# Patient Record
Sex: Female | Born: 1965 | Race: White | Hispanic: No | Smoking: Current some day smoker
Health system: Southern US, Community
[De-identification: ages and names within clinical notes are randomized; demographics above are authoritative.]

## PROBLEM LIST (undated history)

## (undated) DIAGNOSIS — F1010 Alcohol abuse, uncomplicated: Secondary | ICD-10-CM

## (undated) DIAGNOSIS — B1920 Unspecified viral hepatitis C without hepatic coma: Secondary | ICD-10-CM

## (undated) DIAGNOSIS — Z21 Asymptomatic human immunodeficiency virus [HIV] infection status: Secondary | ICD-10-CM

## (undated) DIAGNOSIS — F419 Anxiety disorder, unspecified: Secondary | ICD-10-CM

## (undated) DIAGNOSIS — B20 Human immunodeficiency virus [HIV] disease: Secondary | ICD-10-CM

## (undated) DIAGNOSIS — C801 Malignant (primary) neoplasm, unspecified: Secondary | ICD-10-CM

## (undated) DIAGNOSIS — D649 Anemia, unspecified: Secondary | ICD-10-CM

## (undated) HISTORY — DX: Anxiety disorder, unspecified: F41.9

## (undated) HISTORY — DX: Anemia, unspecified: D64.9

## (undated) HISTORY — DX: Alcohol abuse, uncomplicated: F10.10

---

## 1998-12-30 ENCOUNTER — Encounter: Admission: RE | Admit: 1998-12-30 | Discharge: 1998-12-30 | Payer: Self-pay | Admitting: Infectious Diseases

## 1998-12-30 ENCOUNTER — Ambulatory Visit (HOSPITAL_COMMUNITY): Admission: RE | Admit: 1998-12-30 | Discharge: 1998-12-30 | Payer: Self-pay | Admitting: Infectious Diseases

## 1999-02-03 ENCOUNTER — Ambulatory Visit (HOSPITAL_COMMUNITY): Admission: RE | Admit: 1999-02-03 | Discharge: 1999-02-03 | Payer: Self-pay | Admitting: Infectious Diseases

## 1999-02-03 ENCOUNTER — Encounter: Admission: RE | Admit: 1999-02-03 | Discharge: 1999-02-03 | Payer: Self-pay | Admitting: Infectious Diseases

## 2001-06-17 ENCOUNTER — Emergency Department (HOSPITAL_COMMUNITY): Admission: EM | Admit: 2001-06-17 | Discharge: 2001-06-18 | Payer: Self-pay

## 2001-06-18 ENCOUNTER — Encounter: Payer: Self-pay | Admitting: Emergency Medicine

## 2001-06-22 ENCOUNTER — Emergency Department (HOSPITAL_COMMUNITY): Admission: EM | Admit: 2001-06-22 | Discharge: 2001-06-22 | Payer: Self-pay | Admitting: Emergency Medicine

## 2001-06-22 ENCOUNTER — Encounter: Payer: Self-pay | Admitting: Emergency Medicine

## 2001-06-25 ENCOUNTER — Encounter: Payer: Self-pay | Admitting: Orthopedic Surgery

## 2001-06-25 ENCOUNTER — Inpatient Hospital Stay (HOSPITAL_COMMUNITY): Admission: RE | Admit: 2001-06-25 | Discharge: 2001-06-27 | Payer: Self-pay | Admitting: Orthopedic Surgery

## 2003-01-02 ENCOUNTER — Emergency Department (HOSPITAL_COMMUNITY): Admission: EM | Admit: 2003-01-02 | Discharge: 2003-01-02 | Payer: Self-pay | Admitting: Emergency Medicine

## 2003-01-02 ENCOUNTER — Encounter: Payer: Self-pay | Admitting: Emergency Medicine

## 2004-02-03 ENCOUNTER — Emergency Department (HOSPITAL_COMMUNITY): Admission: EM | Admit: 2004-02-03 | Discharge: 2004-02-03 | Payer: Self-pay | Admitting: Family Medicine

## 2004-04-06 ENCOUNTER — Emergency Department (HOSPITAL_COMMUNITY): Admission: EM | Admit: 2004-04-06 | Discharge: 2004-04-06 | Payer: Self-pay | Admitting: Family Medicine

## 2004-12-15 ENCOUNTER — Emergency Department (HOSPITAL_COMMUNITY): Admission: EM | Admit: 2004-12-15 | Discharge: 2004-12-15 | Payer: Self-pay | Admitting: Emergency Medicine

## 2006-02-28 ENCOUNTER — Encounter: Admission: RE | Admit: 2006-02-28 | Discharge: 2006-02-28 | Payer: Self-pay | Admitting: Internal Medicine

## 2006-02-28 ENCOUNTER — Ambulatory Visit (HOSPITAL_COMMUNITY): Admission: RE | Admit: 2006-02-28 | Discharge: 2006-02-28 | Payer: Self-pay | Admitting: Internal Medicine

## 2006-02-28 ENCOUNTER — Ambulatory Visit: Payer: Self-pay | Admitting: Internal Medicine

## 2006-02-28 ENCOUNTER — Inpatient Hospital Stay (HOSPITAL_COMMUNITY): Admission: RE | Admit: 2006-02-28 | Discharge: 2006-03-02 | Payer: Self-pay | Admitting: Psychiatry

## 2006-03-01 ENCOUNTER — Ambulatory Visit: Payer: Self-pay | Admitting: Psychiatry

## 2006-03-10 ENCOUNTER — Emergency Department (HOSPITAL_COMMUNITY): Admission: EM | Admit: 2006-03-10 | Discharge: 2006-03-10 | Payer: Self-pay | Admitting: Emergency Medicine

## 2006-03-16 ENCOUNTER — Emergency Department (HOSPITAL_COMMUNITY): Admission: EM | Admit: 2006-03-16 | Discharge: 2006-03-16 | Payer: Self-pay | Admitting: Family Medicine

## 2006-04-14 ENCOUNTER — Emergency Department (HOSPITAL_COMMUNITY): Admission: EM | Admit: 2006-04-14 | Discharge: 2006-04-15 | Payer: Self-pay | Admitting: Emergency Medicine

## 2006-06-26 ENCOUNTER — Ambulatory Visit: Payer: Self-pay | Admitting: Hospitalist

## 2006-06-26 ENCOUNTER — Ambulatory Visit (HOSPITAL_COMMUNITY): Admission: RE | Admit: 2006-06-26 | Discharge: 2006-06-26 | Payer: Self-pay | Admitting: Hospitalist

## 2006-08-05 ENCOUNTER — Emergency Department (HOSPITAL_COMMUNITY): Admission: EM | Admit: 2006-08-05 | Discharge: 2006-08-05 | Payer: Self-pay | Admitting: Emergency Medicine

## 2006-11-06 ENCOUNTER — Emergency Department (HOSPITAL_COMMUNITY): Admission: EM | Admit: 2006-11-06 | Discharge: 2006-11-06 | Payer: Self-pay | Admitting: Emergency Medicine

## 2007-04-25 ENCOUNTER — Emergency Department (HOSPITAL_COMMUNITY): Admission: EM | Admit: 2007-04-25 | Discharge: 2007-04-25 | Payer: Self-pay | Admitting: Family Medicine

## 2007-04-30 ENCOUNTER — Emergency Department (HOSPITAL_COMMUNITY): Admission: EM | Admit: 2007-04-30 | Discharge: 2007-04-30 | Payer: Self-pay | Admitting: Emergency Medicine

## 2007-05-13 ENCOUNTER — Emergency Department (HOSPITAL_COMMUNITY): Admission: EM | Admit: 2007-05-13 | Discharge: 2007-05-14 | Payer: Self-pay | Admitting: Emergency Medicine

## 2007-06-17 ENCOUNTER — Encounter: Admission: RE | Admit: 2007-06-17 | Discharge: 2007-06-17 | Payer: Self-pay | Admitting: Internal Medicine

## 2007-08-28 ENCOUNTER — Emergency Department (HOSPITAL_COMMUNITY): Admission: EM | Admit: 2007-08-28 | Discharge: 2007-08-28 | Payer: Self-pay | Admitting: Emergency Medicine

## 2007-09-02 ENCOUNTER — Emergency Department (HOSPITAL_COMMUNITY): Admission: EM | Admit: 2007-09-02 | Discharge: 2007-09-02 | Payer: Self-pay | Admitting: Family Medicine

## 2007-10-03 ENCOUNTER — Emergency Department (HOSPITAL_COMMUNITY): Admission: EM | Admit: 2007-10-03 | Discharge: 2007-10-03 | Payer: Self-pay | Admitting: Emergency Medicine

## 2007-12-16 ENCOUNTER — Emergency Department (HOSPITAL_COMMUNITY): Admission: EM | Admit: 2007-12-16 | Discharge: 2007-12-16 | Payer: Self-pay | Admitting: Emergency Medicine

## 2008-01-21 ENCOUNTER — Emergency Department (HOSPITAL_COMMUNITY): Admission: EM | Admit: 2008-01-21 | Discharge: 2008-01-21 | Payer: Self-pay | Admitting: Emergency Medicine

## 2008-01-24 ENCOUNTER — Emergency Department (HOSPITAL_COMMUNITY): Admission: EM | Admit: 2008-01-24 | Discharge: 2008-01-24 | Payer: Self-pay | Admitting: Emergency Medicine

## 2008-01-31 ENCOUNTER — Emergency Department (HOSPITAL_COMMUNITY): Admission: EM | Admit: 2008-01-31 | Discharge: 2008-01-31 | Payer: Self-pay | Admitting: Emergency Medicine

## 2008-05-16 ENCOUNTER — Emergency Department (HOSPITAL_COMMUNITY): Admission: EM | Admit: 2008-05-16 | Discharge: 2008-05-16 | Payer: Self-pay | Admitting: Emergency Medicine

## 2008-05-25 ENCOUNTER — Inpatient Hospital Stay (HOSPITAL_COMMUNITY): Admission: EM | Admit: 2008-05-25 | Discharge: 2008-05-29 | Payer: Self-pay | Admitting: Emergency Medicine

## 2008-05-30 ENCOUNTER — Emergency Department (HOSPITAL_COMMUNITY): Admission: EM | Admit: 2008-05-30 | Discharge: 2008-05-30 | Payer: Self-pay | Admitting: Emergency Medicine

## 2008-07-18 ENCOUNTER — Emergency Department (HOSPITAL_COMMUNITY): Admission: EM | Admit: 2008-07-18 | Discharge: 2008-07-18 | Payer: Self-pay | Admitting: Emergency Medicine

## 2008-07-19 ENCOUNTER — Emergency Department (HOSPITAL_COMMUNITY): Admission: EM | Admit: 2008-07-19 | Discharge: 2008-07-19 | Payer: Self-pay | Admitting: Emergency Medicine

## 2008-07-24 ENCOUNTER — Emergency Department (HOSPITAL_COMMUNITY): Admission: EM | Admit: 2008-07-24 | Discharge: 2008-07-24 | Payer: Self-pay | Admitting: Emergency Medicine

## 2008-08-27 ENCOUNTER — Ambulatory Visit: Payer: Self-pay | Admitting: Family Medicine

## 2008-09-22 ENCOUNTER — Encounter: Payer: Self-pay | Admitting: Gastroenterology

## 2008-10-19 ENCOUNTER — Ambulatory Visit: Payer: Self-pay | Admitting: Gastroenterology

## 2008-11-16 ENCOUNTER — Ambulatory Visit: Payer: Self-pay | Admitting: Gastroenterology

## 2009-01-13 ENCOUNTER — Emergency Department (HOSPITAL_COMMUNITY): Admission: EM | Admit: 2009-01-13 | Discharge: 2009-01-13 | Payer: Self-pay | Admitting: Family Medicine

## 2009-05-05 ENCOUNTER — Emergency Department (HOSPITAL_COMMUNITY): Admission: EM | Admit: 2009-05-05 | Discharge: 2009-05-05 | Payer: Self-pay | Admitting: Emergency Medicine

## 2009-06-15 ENCOUNTER — Encounter (INDEPENDENT_AMBULATORY_CARE_PROVIDER_SITE_OTHER): Payer: Self-pay | Admitting: Internal Medicine

## 2009-12-20 ENCOUNTER — Emergency Department (HOSPITAL_COMMUNITY): Admission: EM | Admit: 2009-12-20 | Discharge: 2009-12-20 | Payer: Self-pay | Admitting: Emergency Medicine

## 2010-01-08 ENCOUNTER — Emergency Department (HOSPITAL_COMMUNITY): Admission: EM | Admit: 2010-01-08 | Discharge: 2010-01-09 | Payer: Self-pay | Admitting: Emergency Medicine

## 2010-04-15 ENCOUNTER — Emergency Department (HOSPITAL_COMMUNITY): Admission: EM | Admit: 2010-04-15 | Discharge: 2010-04-16 | Payer: Self-pay | Admitting: Emergency Medicine

## 2010-05-05 ENCOUNTER — Emergency Department (HOSPITAL_COMMUNITY): Admission: EM | Admit: 2010-05-05 | Discharge: 2010-05-05 | Payer: Self-pay | Admitting: Emergency Medicine

## 2010-05-06 ENCOUNTER — Emergency Department (HOSPITAL_COMMUNITY): Admission: EM | Admit: 2010-05-06 | Discharge: 2010-05-06 | Payer: Self-pay | Admitting: Emergency Medicine

## 2010-06-02 ENCOUNTER — Ambulatory Visit: Payer: Self-pay | Admitting: Family Medicine

## 2010-06-02 LAB — CONVERTED CEMR LAB
Amphetamine Screen, Ur: NEGATIVE
Barbiturate Quant, Ur: NEGATIVE
Cocaine Metabolites: NEGATIVE
Marijuana Metabolite: NEGATIVE
Opiate Screen, Urine: NEGATIVE

## 2010-06-14 ENCOUNTER — Ambulatory Visit: Payer: Self-pay | Admitting: Family Medicine

## 2010-06-16 ENCOUNTER — Emergency Department (HOSPITAL_COMMUNITY): Admission: EM | Admit: 2010-06-16 | Discharge: 2010-06-16 | Payer: Self-pay | Admitting: Family Medicine

## 2010-06-30 ENCOUNTER — Ambulatory Visit: Payer: Self-pay | Admitting: Family Medicine

## 2010-11-08 ENCOUNTER — Emergency Department (HOSPITAL_COMMUNITY): Admission: EM | Admit: 2010-11-08 | Discharge: 2010-11-10 | Payer: Self-pay | Admitting: Emergency Medicine

## 2010-11-09 DIAGNOSIS — F329 Major depressive disorder, single episode, unspecified: Secondary | ICD-10-CM

## 2010-11-10 ENCOUNTER — Ambulatory Visit: Payer: Self-pay | Admitting: Psychiatry

## 2010-11-10 ENCOUNTER — Inpatient Hospital Stay (HOSPITAL_COMMUNITY)
Admission: EM | Admit: 2010-11-10 | Discharge: 2010-11-14 | Payer: Self-pay | Source: Home / Self Care | Admitting: Psychiatry

## 2010-11-10 DIAGNOSIS — F329 Major depressive disorder, single episode, unspecified: Secondary | ICD-10-CM

## 2011-01-08 ENCOUNTER — Encounter: Payer: Self-pay | Admitting: Internal Medicine

## 2011-01-13 ENCOUNTER — Emergency Department (HOSPITAL_COMMUNITY)
Admission: EM | Admit: 2011-01-13 | Discharge: 2011-01-13 | Disposition: A | Discharge status: Other Acute Inpatient Hospital | Payer: Self-pay | Source: Home / Self Care | Admitting: Emergency Medicine

## 2011-01-13 ENCOUNTER — Inpatient Hospital Stay (HOSPITAL_COMMUNITY)
Admission: EM | Admit: 2011-01-13 | Discharge: 2011-01-25 | DRG: 897 | Disposition: A | Discharge status: Home / Self Care | Payer: Self-pay | Attending: Psychiatry | Admitting: Psychiatry

## 2011-01-13 DIAGNOSIS — M1990 Unspecified osteoarthritis, unspecified site: Secondary | ICD-10-CM

## 2011-01-13 DIAGNOSIS — F10939 Alcohol use, unspecified with withdrawal, unspecified: Secondary | ICD-10-CM

## 2011-01-13 DIAGNOSIS — B1920 Unspecified viral hepatitis C without hepatic coma: Secondary | ICD-10-CM

## 2011-01-13 DIAGNOSIS — F10239 Alcohol dependence with withdrawal, unspecified: Secondary | ICD-10-CM

## 2011-01-13 DIAGNOSIS — M069 Rheumatoid arthritis, unspecified: Secondary | ICD-10-CM

## 2011-01-13 DIAGNOSIS — F1920 Other psychoactive substance dependence, uncomplicated: Principal | ICD-10-CM

## 2011-01-13 DIAGNOSIS — F1020 Alcohol dependence, uncomplicated: Secondary | ICD-10-CM

## 2011-01-13 DIAGNOSIS — G8929 Other chronic pain: Secondary | ICD-10-CM

## 2011-01-13 DIAGNOSIS — F1994 Other psychoactive substance use, unspecified with psychoactive substance-induced mood disorder: Secondary | ICD-10-CM

## 2011-01-13 DIAGNOSIS — X838XXA Intentional self-harm by other specified means, initial encounter: Secondary | ICD-10-CM

## 2011-01-13 DIAGNOSIS — S61509A Unspecified open wound of unspecified wrist, initial encounter: Secondary | ICD-10-CM

## 2011-01-13 DIAGNOSIS — Z21 Asymptomatic human immunodeficiency virus [HIV] infection status: Secondary | ICD-10-CM

## 2011-01-13 LAB — COMPREHENSIVE METABOLIC PANEL
Albumin: 3.9 g/dL (ref 3.5–5.2)
BUN: 6 mg/dL (ref 6–23)
CO2: 29 mEq/L (ref 19–32)
Chloride: 92 mEq/L — ABNORMAL LOW (ref 96–112)
Creatinine, Ser: 0.71 mg/dL (ref 0.4–1.2)
GFR calc non Af Amer: >60 mL/min (ref >60)
Glucose, Bld: 144 mg/dL — ABNORMAL HIGH (ref 70–99)
Total Bilirubin: 0.6 mg/dL (ref 0.3–1.2)

## 2011-01-13 LAB — RAPID URINE DRUG SCREEN, HOSP PERFORMED
Barbiturates: NOT DETECTED
Opiates: POSITIVE — AB

## 2011-01-13 LAB — DIFFERENTIAL
Basophils Relative: 0 % (ref 0–1)
Eosinophils Absolute: 0.2 10*3/uL (ref 0.0–0.7)
Monocytes Absolute: 1.2 10*3/uL — ABNORMAL HIGH (ref 0.1–1.0)
Monocytes Relative: 9 % (ref 3–12)
Neutro Abs: 9 10*3/uL — ABNORMAL HIGH (ref 1.7–7.7)

## 2011-01-13 LAB — ETHANOL: Alcohol, Ethyl (B): <5 mg/dL (ref 0–10)

## 2011-01-13 LAB — CBC
Hemoglobin: 12.8 g/dL (ref 12.0–15.0)
MCH: 28.8 pg (ref 26.0–34.0)
MCHC: 34.8 g/dL (ref 30.0–36.0)

## 2011-01-13 LAB — ACETAMINOPHEN LEVEL: Acetaminophen (Tylenol), Serum: <10 ug/mL — ABNORMAL LOW (ref 10–30)

## 2011-01-16 LAB — ELECTROLYTE PANEL
CO2: 27 mEq/L (ref 19–32)
Chloride: 106 mEq/L (ref 96–112)
Potassium: 3.9 mEq/L (ref 3.5–5.1)

## 2011-01-17 LAB — VITAMIN B12: Vitamin B-12: 536 pg/mL (ref 211–911)

## 2011-01-17 LAB — FOLATE: Folate: 13.1 ng/mL

## 2011-01-18 DIAGNOSIS — F19939 Other psychoactive substance use, unspecified with withdrawal, unspecified: Secondary | ICD-10-CM

## 2011-01-18 DIAGNOSIS — F1920 Other psychoactive substance dependence, uncomplicated: Secondary | ICD-10-CM

## 2011-01-29 NOTE — Discharge Summary (Signed)
[name]              ACCOUNT NO.:  [id number]  MEDICAL RECORD NO.:  [id number]           PATIENT TYPE:  I  LOCATION:  0500                          FACILITY:  [facility]  PHYSICIAN:  Marlis Edelson, DO        DATE OF BIRTH:  [birth year]  DATE OF ADMISSION:  01/13/2011 DATE OF DISCHARGE:                              DISCHARGE SUMMARY   CHIEF COMPLAINT:  Drug abuse.  HISTORY:  [name] is a 45 year old divorced Caucasian female who presented to the emergency department via EMS when her son called stating that she had attempted to kill herself by cutting her wrist. She showed up to the [facility] Emergency Department where she had a laceration to her wrist apparently.  She had attempted to kill himself by cutting her wrist.  She had diagonal bilateral lacerations. Those were repaired in the emergency department.  The patient told them in the emergency department that she had dementia.  She stated she was having problems remembering things at times and taking care of herself. She also acknowledged hallucinations for the past 3-4 weeks, and she reported that she saw a dark ghost, flashes of white light, hears unintelligible voices and sounds.  She was tearful and drowsy during the assessment.  Urine drug screen was positive for opiates.  Alcohol was less than 5. Electrolytes were altered with potassium of 3.1 and a sodium level of 131, chloride was 92, glucose was elevated at 144.  CBC was elevated at 12.6.  She has previously been admitted to the [facility] due to polysubstance drug dependence.  She has also been treated at the [facility] in 1984.  She was on no medications at the time of admission.  HOSPITAL COURSE:  [name] was admitted to the Adult Unit where she was integrated into the adult milieu.  She attended substance abuse groups and Alcoholics Anonymous.  She has a known history of human immunodeficiency virus, vitamin C,  chronic pain and rheumatoid arthritis.  She was on no medications at the time of her admission and did not want interventions for her HIV.  She was placed on a Librium detox protocol due to her polysubstance abuse.  She tolerated that detox protocol without difficulty, having no severe withdrawal, no seizures and no DTs.  She was also placed on Risperdal 0.5 mg three times a day, Benadryl 50 mg nightly p.r.n.  during her hospitalization, the Librium was finally discontinued.  She was started on Neurontin due to her severe pain.  Risperdal was discontinued.  She was titrated to 300 mg of Neurontin three times a day for neuropathy, Naprosyn 500 mg twice per day for pain, Protonix 20 mg daily for GI prophylaxis and gastritis. She tolerated all of those medications well.  Naprosyn was then discontinued and the patient was placed on Mobic 7.5 mg daily for arthritis, which she takes at home.  That was eventually titrated to a dose of 15 mg.  She had an increase in her Neurontin to 400 mg later in her hospitalization.  She also received albuterol inhaler 1-2 puffs every 4 hours as  needed for shortness of breath.  Her staples from her wrist lacerations were discontinued.  Those wounds healed without difficulty.  She was placed on trazodone 50 mg nightly for sleep and did receive Ensure for dietary supplementation.  Suicidal ideation stopped.  She stated my thought pattern had changed and that the milieu had helped.  She was willing to look at longer term treatment, and she was involuntarily committed with a plan of sending her to [facility].  The patient related by January 30 that her mood was good. A combination of events had led to her feeling depressed.  That was work, school, children and the death of a man with whom she was in a relationship.  She displayed no suicidal activities during the course of her hospitalization.  She had no homicidality, no hypomania, mania or psychosis.  The Neurontin  began to help.  Her physical condition tended to improve.  Folic acid was normal at 13.1 ng/mL, vitamin B12 was normal at 536 pg/mL.  The TSH was low at 0.112.  Repeat chemistry showed sodium normal at 140, potassium normal at 3.9, chloride normal at 106, and CO2 normal at 27.  Her blood pressure was within normal limits.  We did recheck TSH and T3 and T4.  Records from [facility] regarding HIV status and treatment were requested.  She reported by February 1 that she was physically better.  Vital signs remained stable.  She had no drug or alcohol cravings.  She was sleeping better.  She was more steady on her feet.  She completed the Librium and clonidine detox protocol without difficulty.  Her T4 was 1.25, T3 was 2.4.  She continued to remain active in groups and was accepted into the [facility] program for transfer on January 25, 2011.  LABORATORY/IMAGING:  As above.  CONSULTATIONS:  None.  COMPLICATIONS:  None.  PROCEDURES:  None.  MENTAL STATUS EXAMINATION:  She was casually dressed, eye contact was appropriate.  Behavior was within normal limits.  Speech clear, coherent, regular rate, rhythm, volume and tone.  Her mood was improved, affect mildly constricted.  Thought process linear, logical and goal- directed.  Thought content without perceptual symptoms, ideas of reference or delusions.  Her judgment was fair.  Insight had improved some.  She was cognitively intact.  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 AXIS II:  Deferred. AXIS III:  Human immunodeficiency virus, hepatitis C, chronic pain, osteoarthritis, rheumatoid arthritis. AXIS IV:  Chronic substance abuse. AXIS V:  45.  DISCHARGE INSTRUCTIONS:  The patient will be transported to the [facility] facility for further inpatient treatment.  DISCHARGE MEDICATIONS: 1. Protonix 20 mg daily. 2. Neurontin 400 mg three times a day. 3.  Mobic 15 mg daily. 4. Trazodone 50 mg nightly.  Further recommendations per [facility] personnel.  PROGNOSIS:  Dependent upon completion of the substance abuse treatment program.          ______________________________ Marlis Edelson, DO    DB/MEDQ  D:  01/24/2011  T:  01/24/2011  Job:  [id number]  Electronically Signed by Marlis Edelson MD on 01/29/2011 11:33:55 AM

## 2011-01-29 NOTE — H&P (Signed)
NAMESALA, TAGUE              ACCOUNT NO.:  [id number]  MEDICAL RECORD NO.:  [id number]           PATIENT TYPE:  LOCATION:                                 FACILITY:  PHYSICIAN:  Marlis Edelson, DO        DATE OF BIRTH:  Apr 06, 1966  DATE OF ADMISSION: DATE OF DISCHARGE:                      PSYCHIATRIC ADMISSION ASSESSMENT   This is a voluntary admission to the services of Dr. Marlis Edelson.  HISTORY OF PRESENT ILLNESS:  This is a 45 year old divorced white female.  She presented to the emergency department via EMS which was called by her son.  Apparently she had attempted to kill herself by cutting her wrists.  She had diagonal bilateral lacerations.  The left wrist 3 deep lacerations, and 4 to 5 cm of muscle fascia was visible and one laceration to the right wrist 3 cm long.  The patient told them that she had a dementia.  However, it is not a professional diagnosis, that she had been experiencing problems with remembering dates, times, taking care of herself.  She also acknowledged hallucinations for the past 3 to 4 weeks.  She reported that she sees a dark ghost, flashes of white light, hears unintelligible voices and sounds.  She was tearful and drowsy during the assessment.  Her urine drug screen was positive only for opiates.  Her alcohol level was less than 5.  Her electrolytes were off.  Her sodium was low at 131, potassium low 3.1.  Chloride was 92.  Glucose was elevated at 144.  WBC was elevated at 12.6, and she did not have any acetaminophen on board.  PAST PSYCHIATRIC HISTORY:  She was here in 2011.  She was also in [facility] in 1984.  SOCIAL HISTORY:  She denies alcohol and drug history.  She reported this morning that she does a lot of drugs.  She was not very forthcoming yesterday, and we had to start her on the clonidine protocol.  Her primary care provider is [facility].  She has seen a Dr. Omelia Blackwater at [facility] for psychiatry.  MEDICAL  PROBLEMS:  She is known to be HIV positive, have hepatitis C, chronic pain, rheumatoid arthritis.  MEDICATIONS:  She states she does not take any.  We were unable to verify.  We will have to work on that.  DRUG ALLERGIES:  She says she is allergic to ACETAMINOPHEN, ASPIRIN, CIPRO, and CODEINE.  POSITIVE PHYSICAL FINDINGS:  As already stated she was medically cleared in the ED at [facility].  Vital signs showed she was afebrile, 98 to 98.5.  Her pulse was 76 to 103.  Respirations were 16 to 21.  Blood pressure was 99/62 to 114/71.  She did have lacerations to her left and right wrists.  However, apparently they did not require sutures.  MENTAL STATUS EXAMINATION:  She was seen in conjunction with Dr. Geralyn Flash this morning.  She was disheveled.  She appeared ill and uncomfortable.  She was in the bed.  She was dysphoric, although she was alert.  She had no signs and symptoms of psychosis or delirium.  She wanted help, and it was  felt that she was in withdrawal.  DIAGNOSES:  AXIS I:  Polysubstance abuse, mood disorder secondary to various chronic illnesses, human immunodeficiency virus, hepatitis C. AXIS II:  Deferred. AXIS III:  Human immunodeficiency virus positive, hepatitis C, chronic pain, rheumatoid arthritis. AXIS IV:  Moderate. AXIS V:  31.  PLAN:  Is to admit for safety and stabilization.  We are going to have to increase her data base, and she was empirically started on the clonidine protocol.  Her need for antidepressants will be reassessed as she clears, and estimated length of stay is 3 to 5 days.     Mickie Leonarda Salon, P.A.-C.   ______________________________ Marlis Edelson, DO    MD/MEDQ  D:  01/14/2011  T:  01/14/2011  Job:  [id number]  Electronically Signed by Jaci Lazier ADAMS P.A.-C. on 01/28/2011 12:36:00 PM Electronically Signed by Marlis Edelson MD on 01/29/2011 11:33:50 AM

## 2011-02-28 LAB — RAPID URINE DRUG SCREEN, HOSP PERFORMED
Benzodiazepines: POSITIVE — AB
Cocaine: NOT DETECTED
Opiates: POSITIVE — AB
Tetrahydrocannabinol: NOT DETECTED

## 2011-02-28 LAB — DIFFERENTIAL
Basophils Relative: 1 % (ref 0–1)
Eosinophils Absolute: 0.2 10*3/uL (ref 0.0–0.7)
Eosinophils Relative: 3 % (ref 0–5)
Monocytes Absolute: 0.5 10*3/uL (ref 0.1–1.0)
Monocytes Relative: 12 % (ref 3–12)

## 2011-02-28 LAB — CBC
HCT: 34.2 % — ABNORMAL LOW (ref 36.0–46.0)
Platelets: 181 10*3/uL (ref 150–400)
RBC: 3.84 MIL/uL — ABNORMAL LOW (ref 3.87–5.11)
RDW: 15.1 % (ref 11.5–15.5)
WBC: 4.6 10*3/uL (ref 4.0–10.5)

## 2011-02-28 LAB — URINALYSIS, ROUTINE W REFLEX MICROSCOPIC
Bilirubin Urine: NEGATIVE
Glucose, UA: NEGATIVE mg/dL
Ketones, ur: NEGATIVE mg/dL
Specific Gravity, Urine: 1.009 (ref 1.005–1.030)
pH: 7 (ref 5.0–8.0)

## 2011-02-28 LAB — COMPREHENSIVE METABOLIC PANEL
ALT: 25 U/L (ref 0–35)
Albumin: 3.1 g/dL — ABNORMAL LOW (ref 3.5–5.2)
Alkaline Phosphatase: 60 U/L (ref 39–117)
Chloride: 115 mEq/L — ABNORMAL HIGH (ref 96–112)
Potassium: 3.3 mEq/L — ABNORMAL LOW (ref 3.5–5.1)
Sodium: 143 mEq/L (ref 135–145)
Total Bilirubin: 0.4 mg/dL (ref 0.3–1.2)
Total Protein: 6 g/dL (ref 6.0–8.3)

## 2011-02-28 LAB — POCT PREGNANCY, URINE: Preg Test, Ur: NEGATIVE

## 2011-02-28 LAB — POCT CARDIAC MARKERS: Troponin i, poc: <0.05 ng/mL (ref 0.00–0.09)

## 2011-02-28 LAB — ETHANOL: Alcohol, Ethyl (B): 194 mg/dL — ABNORMAL HIGH (ref 0–10)

## 2011-02-28 LAB — POCT I-STAT, CHEM 8
BUN: 3 mg/dL — ABNORMAL LOW (ref 6–23)
Calcium, Ion: 1.11 mmol/L — ABNORMAL LOW (ref 1.12–1.32)
Chloride: 111 mEq/L (ref 96–112)
HCT: 36 % (ref 36.0–46.0)
Potassium: 3.4 mEq/L — ABNORMAL LOW (ref 3.5–5.1)

## 2011-02-28 LAB — ACETAMINOPHEN LEVEL: Acetaminophen (Tylenol), Serum: <10 ug/mL — ABNORMAL LOW (ref 10–30)

## 2011-03-07 LAB — URINALYSIS, ROUTINE W REFLEX MICROSCOPIC
Glucose, UA: NEGATIVE mg/dL
Hgb urine dipstick: NEGATIVE
Specific Gravity, Urine: 1.004 — ABNORMAL LOW (ref 1.005–1.030)
Urobilinogen, UA: 0.2 mg/dL (ref 0.0–1.0)
pH: 6 (ref 5.0–8.0)

## 2011-03-07 LAB — URINE CULTURE

## 2011-03-07 LAB — URINE MICROSCOPIC-ADD ON

## 2011-03-28 LAB — COMPREHENSIVE METABOLIC PANEL
Alkaline Phosphatase: 63 U/L (ref 39–117)
BUN: 11 mg/dL (ref 6–23)
Chloride: 106 mEq/L (ref 96–112)
Glucose, Bld: 103 mg/dL — ABNORMAL HIGH (ref 70–99)
Potassium: 3.7 mEq/L (ref 3.5–5.1)
Total Bilirubin: 0.3 mg/dL (ref 0.3–1.2)
Total Protein: 6.1 g/dL (ref 6.0–8.3)

## 2011-03-28 LAB — URINE MICROSCOPIC-ADD ON

## 2011-03-28 LAB — LIPASE, BLOOD: Lipase: 25 U/L (ref 11–59)

## 2011-03-28 LAB — DIFFERENTIAL
Basophils Absolute: 0 10*3/uL (ref 0.0–0.1)
Basophils Relative: 1 % (ref 0–1)
Neutro Abs: 1.9 10*3/uL (ref 1.7–7.7)
Neutrophils Relative %: 35 % — ABNORMAL LOW (ref 43–77)

## 2011-03-28 LAB — CBC
HCT: 34.3 % — ABNORMAL LOW (ref 36.0–46.0)
Hemoglobin: 11.5 g/dL — ABNORMAL LOW (ref 12.0–15.0)
RDW: 15.5 % (ref 11.5–15.5)

## 2011-03-28 LAB — URINALYSIS, ROUTINE W REFLEX MICROSCOPIC
Ketones, ur: NEGATIVE mg/dL
Nitrite: NEGATIVE
Protein, ur: NEGATIVE mg/dL

## 2011-03-28 LAB — LACTIC ACID, PLASMA: Lactic Acid, Venous: 1 mmol/L (ref 0.5–2.2)

## 2011-05-02 NOTE — Consult Note (Signed)
NAMELYNNET, HEFLEY              ACCOUNT NO.:  [id number]   MEDICAL RECORD NO.:  [id number]          PATIENT TYPE:  INP   LOCATION:  1332                         FACILITY:  [facility]   PHYSICIAN:  Antonietta Breach, M.D.  DATE OF BIRTH:  1966-09-19   DATE OF CONSULTATION:  05/26/2008  DATE OF DISCHARGE:                                 CONSULTATION   REASON FOR CONSULTATION:  Psychosis agitation.   REQUESTING PHYSICIAN:  [facility].   HISTORY OF PRESENT ILLNESS:  Ms. Rembert is a 45 year old female  admitted to the [facility] on May 25, 2008 with abdominal  pain, constipation, abdominal distention.   Over the past 24 hours, the patient has erupted with severe agitation,  confusion, impaired memory.  She has been pacing and told her attending  physician that she was the devil.  She told the undersigned when the  undersigned arrived to her room that the undersigned was God.   These symptoms have continued despite general medical supportive care.   The patient was on lithium 300 mg daily.  The other psychotropics prior  to admission, if any, are not currently known.   PAST PSYCHIATRIC HISTORY:  The patient has a prior medical record from  [facility], and she is not an adequate historian at this time.   In review of the past medical record, the patient was admitted to the  [facility] inpatient psychiatric adult unit in March of 2007 involuntarily.  Her alcohol level at that time was 170.  She presented with a number of  weeks of depressed mood, poor energy, difficulty concentrating,  anhedonia, consistent with major depressive symptoms.   The patient was treated with Wellbutrin at bedtime and was diagnosed  with major depressive disorder and alcohol abuse.   The patient was also known to have been followed by the [facility] at  that time.  She denied any suicide attempts at that time.   The patient has mentioned bipolar disorder as a diagnosis during this  history;  however, with the patient's current delirium state, this cannot  be confirmed.  She was listed as having a psychotropic agent, lithium  300 mg daily on admission.  The exact dosage and other psychotropic  agents, if any, are not known.   FAMILY PSYCHIATRIC HISTORY:  None known.   SOCIAL HISTORY:  The patient cannot provide a history at this time;  however, in review of the past medical record, he has a history of being  a single mother with a 22 year old.  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  Her husband died of HIV.  Legally, the patient has a history of disorderly conduct charge.   PAST MEDICAL HISTORY:  HIV positive with the last CD-4 count by report  880.   ALLERGIES:  PENICILLIN, CIPROFLOXACIN.   Please see the above for other current general medical status.   MEDICATIONS:  THE MAR is reviewed.  The patient is on lithium 300 mg and  daily, Valium 2 mg IV q.6h. p.r.n.   LABORATORY DATA AND TESTS:  Head  CT without contrast on May 16, 2008 was  unremarkable.  WBC 10.3, hemoglobin 10.5, platelet count 290.  Sodium  140, BUN 30, creatinine 0.58.  HCG negative.  SGOT 23, SGPT 21.  QTC on  EKG 465 milliseconds.   REVIEW OF SYSTEMS:  Constitutional, head, eyes, ears, nose, throat,  mouth, neurologic, psychiatric, cardiovascular, respiratory,  gastrointestinal, genitourinary, skin, musculoskeletal, hematologic,  lymphatic, endocrine, metabolic all unremarkable.   PHYSICAL EXAMINATION:  VITAL SIGNS:  Temperature 97.6, pulse 78,  respiratory rate 20, blood pressure 136/98, O2 saturation on room air  98%.  Weight 69.2 kg.  GENERAL APPEARANCE:  Ms. Godino is a middle-aged female who is  agitated, in bed, shifting position often.  Her attention span is  obviously decreased.  She has no abnormal involuntary movements.   MENTAL STATUS EXAM:  Ms. Coriz does have clouding of consciousness.  She has decreased attention span with easy distractibility.   Her affect  is agitated.  Her mood is very anxious.  She is able to provide the date  and the day of the month as well as the year.  She does know where she  is.  She is oriented to person.  Her memory is impaired.  She cannot  recall the events of the morning.  Her speech is pressured.  Thought  process involves some disorganization and some flight of ideas.  Thought  content, delusions.  No thoughts of harming herself or others are  evident.  She does not appear to be having any hallucinations at this  time.  Insight is poor.  Judgment is impaired.  Fund of knowledge and  intelligence are below that of her estimated predelirium baseline.   ASSESSMENT:   AXIS I:  1. 293.00, delirium, not otherwise specified.  This does appear to be      multifactorial and could be due to lithium toxicity combined with      her chronic infectious status as well as other acute general      medical factors including a mild anemia and acute factors as      mentioned above.  2. Rule out bipolar disorder versus major depressive disorder.  3. Rule out alcohol dependence.  No current physiologic dependence is      known by vital signs; however, this still is a possibility.  Also,      alcohol withdrawal could be contributing to the delirium.   AXIS II:  Deferred.   AXIS III:  See past medical history and labs above.   AXIS IV:  General medical primary support group.   AXIS V:  20.   RECOMMENDATIONS:  1. Would check a lithium level and a TSH.  2. Would start Zyprexa 5 mg p.o. or IM daily at 1600, with the first      dose as soon as possible today.  Would recheck her QTC, and if not      over 500 milliseconds would then increase the Zyprexa to 10 mg      daily for anti-agitation anti-psychosis.  3. Would discontinue the Valium and start Ativan 1-3 mg p.o.,IM, or IV      q.4h. p.r.n.  4. The psychiatric plan for her more chronic condition will have to be      adjusted as the patient's acute symptoms  improve.  5. Would provide memory and orientation cues in the room and continue      with a low stimulation environment.      Antonietta Breach, M.D.  Electronically Signed     JW/MEDQ  D:  05/26/2008  T:  05/26/2008  Job:  [id number]

## 2011-05-02 NOTE — H&P (Signed)
[name], [name]              ACCOUNT NO.:  [id number]   MEDICAL RECORD NO.:  [id number]          PATIENT TYPE:  INP   LOCATION:  1332                         FACILITY[facility]  [facility]   PHYSICIAN:  Herbie Saxon, MDDATE OF BIRTH:  1966/03/02   DATE OF ADMISSION:  05/25/2008  DATE OF DISCHARGE:                              HISTORY & PHYSICAL   PRIMARY CARE PHYSICIAN:  Dr. Coralee Pesa   Patient is a Full Code.   CHIEF COMPLAINT:  Abdominal pain and distention for two days associated  with fever, along with some constipation.   HISTORY OF PRESENTING COMPLAINT:  This is a 45 year old Caucasian female  with past history of HIV  positive, hepatitis __________, COPD,  gastroesophageal reflux, rheumatic arthritis, tobacco abuse and ex-  alcohol drinker, who is on narcotics analgesics.  She was  quite well  until two days ago when she was started noticing generalized abdominal  pain, 8 to 10 in intensity, throbbing, nonradiating.  Partly relieved by  sitting up, associated with abdominal distention and constipation.  She  did have an episode of vomiting, clear,  some ingested food yesterday,  associated with short high grade fever this morning which was relieved  after taking ibuprofen.  She denies any melena or hematemesis.  No  jaundice.  She has a dry cough, no shortness of breath or wheezing.  No  new skin rash or joint swelling; no node swelling.  CD4 count is 880.  The CT scan of the abdomen on presentation showed fecal impaction versus  clinical ileus.   PAST SURGICAL HISTORY:  Foot surgery.   SOCIAL HISTORY:  She smokes one pack per day for more than 20 years.  Alcohol drinker.  She denies drug abuse.   FAMILY HISTORY:  Rheumatoid arthritis and osteoarthritis in her mother.  Father had TB.   MEDICATIONS:  1. Morphine 10 mg daily.  2. OxyContin 10 mg b.i.d.  3. Prevacid 30 mg daily.  4. Lithium 300 mg daily.  5. Valium 2 mg q.6h. p.r.n.   ALLERGIES:  CIPRO and  PENICILLIN.   PHYSICAL EXAMINATION:  GENERAL:  She is middle aged lady.  Not in acute  respiratory distress.  VITAL SIGNS:  Temperature 98, pulse 108, respiratory rate 18, blood  pressure 114/44.  NECK:  Supple.  Oropharynx and nasopharynx are clear.  Mucous membranes  moist.  No jaundice.  No clubbing or cyanosis.  CHEST:  She has reduced breath sounds on the left base.  ABDOMEN:  Distended, firm, generalized tenderness to touch markedly in  the lower quadrants. Bowel sounds are hypoactive.  She is alert and oriented to person.  Power is 5.  Deep tendon reflexes  are 2 globally.  NEUROLOGIC: Cranial nerves II-XII intact.   LABORATORY DATA:  WBC 9.9, hematocrit 33, platelet count 313,000.  Sodium 138, potassium 2.4, chloride 104, bicarb 26, glucose 103, BUN 4,  creatinine 0.6.  AST 23, lipase 35.  Urinalysis shows small leukocyte  esterase and WBCs 0-2.  CT of abdomen and pelvis shows marked distention  of large bowel loops consistent with either distal small bowel  obstruction,  possibly secondary to cecal infarction versus colonic  ileus. Chest x-ray shows left base atelectasis and/or infiltrate.  Bladder is negative.  No free fluid.  Uterus; Right adnexal structures  appear normal.  There is a corpus luteum cyst within the left ovary.   ASSESSMENT:  1. Distal bowel obstruction.  2. Hypokalemia.  3. HIV positive.  4. Tobacco abuse.  5. History of alcohol abuse.  6. Query left lower lobe pneumonia.   She will be admitted to a medical bed.  Start on IV fluid, D5 normal  saline 100 mL per hour.  Add 40 mEq KCl in 4th liter of IV fluid.  Add  thiamine and folic acid to IV fluid.  She is to be NPO  until reviewed.  Consider surgical evaluation if patient does not respond to  Fleet's  enema or disimpaction.  Obtain blood cultures x2.  EKG,coagulation  parameters.  SCDs for DVT prophylaxis.  Phenergan or Reglan IV q.6h.  p.r.n. for nausea. Protonix 40 mg  IV daily.  Albuterol and  Atrovent  q.6h. p.r.n.  Tylenol 650 mg p.o. q.4 h. p.r.n. for pain.  Toradol 30 mg  IV or IM q.8h. p.r.n.  Ativan 2 mg IV q.6h. p.r.n. for agitation.  Resume Lithium and p.o. medication once the obstruction is relieved.  Start on IV Rocephin and Zithromax.  Lithium started p.r.n.  Colace 100  mg b.i.d.  Increase fiber in diet.      Herbie Saxon, MD  Electronically Signed     MIO/MEDQ  D:  05/25/2008  T:  05/26/2008  Job:  [id number]   cc:   Daleen Snook  Fax: [phone]

## 2011-05-02 NOTE — Discharge Summary (Signed)
[name], [name]              ACCOUNT NO.:  [id number]   MEDICAL RECORD NO.:  [id number]          PATIENT TYPE:  INP   LOCATION:  1507                         FACILITY:  [facility]   PHYSICIAN:  Lonia Blood, M.D.       DATE OF BIRTH:  [birth year]   DATE OF ADMISSION:  05/25/2008  DATE OF DISCHARGE:  05/28/2008                               DISCHARGE SUMMARY   PRIMARY CARE PHYSICIAN:  Dr. Cleda Daub.  The patient though has now  been referred to the general medical clinic of Dr. Feliciana Rossetti.   DISCHARGE DIAGNOSES:  1. Fecal impaction - resolved.  2. Delirium secondary to benzodiazepine withdrawal - resolved.  3. Bipolar disorder.  4. Osteoarthritis with chronic pain syndrome on chronic opiates.  5. Chronic obstructive pulmonary disease.  6. Pneumonia.  7. Chronic anxiety.  8. 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  9. Hypokalemia - resolved.  10.Hepatitis C positive.  11.Tobacco abuse.   DISCHARGE MEDICATIONS:  1. Lithium carbonate 300 mg twice a day.  2. OxyContin 10 mg twice a day.  3. Oxycodone 30 mg every 6 hours as needed for breakthrough pain.  4. Prevacid 30 mg daily.  5. Nicotine patch 21 mg daily.  6. Combivent 1 puff three times a day.  7. MiraLax 17 gm daily.  8. Stool softener over-the-counter daily.  9. Senokot 2 tablets at bedtime.  10.Valium 10 mg three times a day.  11.Doxycycline 100 mg twice a day for 3 days.  12.Capsaicin to right knee twice a day.   CONDITION ON DISCHARGE:  [name] is discharged in good condition.  She is instructed to follow up with the [facility] on June 02, 2008.  She will have a lithium level checked at the time of the  follow-up visit.  The patient also follows up once a year with [facility] for  HIV care.   PROCEDURE ON THIS ADMISSION:  The patient underwent an  abdominal x-ray  with findings of stool in the rectosigmoid colon, colonic dilatation.   CONSULTATION:  The patient was seen in consultation by Dr. Antonietta Breach for psychiatry.   HISTORY AND PHYSICAL:  Refer to the dictated H&P which was done by Dr.  Christella Noa on May 25, 2008.   HOSPITAL COURSE:  1. Fecal impaction.  [name] was admitted through the emergency      room with fever and fecal impaction.  She received multiple enemas      and eventually she released large bowel movements.  She then was      started on a bowel regimen, including Senokot, Colace and MiraLax.  2. Left lower lobe pneumonia.  This has been treated with intravenous      Rocephin and azithromycin with resolution of the fevers.  The      patient was switched to oral doxycycline.  She will complete 1 week      of antibiotics.  3. Delirium.  Upon the  initial presentation, [name] was unable      to take her scheduled Valium.  She apparently takes 10 mg of Valium      every 6 hours at home.  The patient became delirious and agitated,      but after she evaluated by Dr. Antonietta Breach, and started on high-      doses of Ativan, she did improve.  She was then switched to her      regular Valium, and she was discharged without delirium.  4. Chronic pain syndrome.  [name] was titrated back on her      OxyContin and oxycodone and she is doing well at discharge.  5. HIV.  [name] will follow up with her primary HIV physician at      [facility].  6. Bipolar disorder.  [name] for Dr. Antonietta Breach, who      increased her lithium to 300 mg twice a day.  The patient will need      to have a lithium level checked at the [facility] on      June 02, 2008.      Lonia Blood, M.D.  Electronically Signed     SL/MEDQ  D:  05/29/2008  T:  05/29/2008  Job:  [id number]   cc:   Quentin Mulling, MD  Fax: [phone]   General Medical Clinic   [place]  D. Pecola Leisure, M.D.  Fax: [phone]

## 2011-05-02 NOTE — Consult Note (Signed)
NAMEUNITY, LUEPKE              ACCOUNT NO.:  [id number]   MEDICAL RECORD NO.:  [id number]          PATIENT TYPE:  INP   LOCATION:  1507                         FACILITY:  [facility]   PHYSICIAN:  Antonietta Breach, M.D.  DATE OF BIRTH:  07/22/1966   DATE OF CONSULTATION:  05/28/2008  DATE OF DISCHARGE:                                 CONSULTATION   Ms. Schiefelbein's confusion has resolved.  She is now oriented.  She is not  having any thoughts of harming herself or others.  She has no delusions  or hallucinations.  Her judgment has returned.  She is not having any  racing thoughts.  She describes constructive interests and future goals.   PAST PSYCHIATRIC HISTORY UPDATE:  The patient has had periods in the  past where she has elevated mood, increased energy and decreased need  for sleep.  She confirms that she has been diagnosed with bipolar  disorder.  Her standing dosage of lithium has been 300 mg b.i.d.  [facility]  She has been very  pleased with the lithium.  It has resulted in a stable mood for her.   MENTAL STATUS EXAM:  Ms. Nyquist is alert.  She is oriented to all  spheres.  Her affect is broad and appropriate mood is within normal  limits.  Speech within normal limits.  Thought process logical,  coherent, goal-directed.  No looseness of associations.  Thought  content:  No thoughts of harming herself, no thoughts of harming others,  no delusions, no hallucinations.  Affect is slightly anxious at baseline  but broad and appropriate.  Mood within normal limits.  Insight is good,  judgment intact.   ASSESSMENT:  1. 293.00 Delirium not otherwise specified, resolved.  2. 296.80 Bipolar disorder, not otherwise specified, stable.   The patient is psychiatrically cleared for discharge.  She agrees to  call emergency services immediately for any thoughts of harming herself,  thoughts of harming others or distress.   The undersigned provided ego  supportive psychotherapy and education.  The indications, alternatives and adverse effects of lithium were  discussed with the patient including the risk of death, kidney damage  and thyroid damage.  The patient understands and would like to continue  lithium   RECOMMENDATIONS:  1. Would increase the patient's lithium to 300 mg p.o. b.i.d.  2. Would check a 12-hour lithium trough level with a basic metabolic      panel in 6 days.  3. I would have the patient follow up with her outpatient      psychiatrist.  4. The patient will require reassessing the lithium and possible      adjustment until it is in the optimal range of 0.8-1.2.  This can      be done by her outpatient psychiatrist along with her general      medical laboratory and practitioner if needed.  5. The patient on lithium also requires periodic electrolyte      assessment of kidney function along with periodic lithium levels      and thyroid function checks.  This can be done by her outpatient      psychiatrist in combination with her general medical labs and if      needed, her general medical practitioner on an outpatient basis.      Antonietta Breach, M.D.  Electronically Signed     JW/MEDQ  D:  05/28/2008  T:  05/28/2008  Job:  [id number]

## 2011-05-05 NOTE — H&P (Signed)
NAMEWESTYN, [name]              ACCOUNT NO.:  [id number]   MEDICAL RECORD NO.:  [id number]          PATIENT TYPE:  IPS   LOCATION:  0301                          [facility]  [facility]   PHYSICIAN:  Anselm Jungling, MD  DATE OF BIRTH:  10-25-1966   DATE OF ADMISSION:  02/28/2006  DATE OF DISCHARGE:                         PSYCHIATRIC ADMISSION ASSESSMENT   IDENTIFYING INFORMATION:  This is a 45 year old widowed white female.  This  is an involuntary admission.   HISTORY OF PRESENT ILLNESS:  This single mother presented with some  arthritis pain in her right shoulder to her primary care practitioner  yesterday at the outpatient clinic and was found to be inebriated at that  time.  Her alcohol level was, in fact, 170.  She said that she had taken  just 2 ounces of alcohol in the morning to minimize the arthritis pain in  her right shoulder that she woke up with.  Said that she had not had more  than that but then admits having drunk alcohol also the day before.  She was  involuntarily petitioned by her primary care practitioner after also saying  that she had wished that she was dead and admitted that she was quite  depressed.  She reports a history of depression for the past year,  aggravated by parenting issues with her 47 year old who had been skipping  school and using marijuana.  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  This was also aggravating her, exacerbating her  dysphoric mood.  The patient endorses approximately three weeks of decreased  concentration, poor sleep, anhedonia, dysphoric mood with some crying spells  and poor appetite.  She had previously gone to [facility] to talk with  them about her concerns with her drinking and missed an appointment on February 28, 2006 to start the program there.  She denies any suicidal ideation  today, denies hallucinations, or  homicidal thought or auditory or visual  hallucinations.  She does endorse some feeling anxious, twitchy and a little  bit shaky inside.   PAST PSYCHIATRIC HISTORY:  The patient has no history of prior psychiatric  care.  This is her first inpatient psychiatric admission and her first  treatment.  She does endorse a history of alcohol abuse on and off.  Denies  that she has ever been addicted.  No prior history of suicide attempts.  She  does have a history of Darvocet abuse in the past after some foot surgery  which she says she resolved on her own by tapering the medicine off.   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  Not currently employed.  Was previously working as a  Engineer, drilling.  Has not worked in a year since her previous patient  died.  She is widowed.  Her husband died approximately five or six years ago  and was HIV positive.  The patient, herself, was diagnosed  HIV positive  approximately in 1999/05/24, after the death of her husband.  She does have  current legal charges for disorderly conduct with a court date pending.  She  lives with her daughter in their own residence here in [place].   FAMILY HISTORY:  Remarkable for a mother with a history of depression.   ALCOHOL/DRUG HISTORY:  As noted above.  The patient has a history of some  abuse of alcohol intermittently and previous history of Darvocet abuse.  She  denies abuse or dependence on any other substances now or in the past.   MEDICAL HISTORY:  The patient is currently followed by Dr. Duncan Dull at  the [facility].  She had her first appointment yesterday  to establish and was unable to complete that appointment.  Current medical  problems are HIV positive by the patient's history, diagnosed in 1999-05-24 after  the death of her HIV positive husband.  Also current history of some right  shoulder pain which she denies today and history of  dysuria, on and off for  the past two months.  Past medical history is remarkable for no history of  seizures, blackouts or memory loss.  C-section in 05-23-88, bunionectomy of her  right foot a couple of years ago with revision.  The patient's HIV status  has never been assessed within the past four years.  She also has a history  of frequent urinary tract infections.   REVIEW OF SYSTEMS:  Review of systems, today, is remarkable for the patient  smoking two packs per day of cigarettes.  Having intermittent nighttime  chills with sweats, accompanied by some dysuria, occasional burning when she  tries to urinate and urinary hesitancy.  She reports her weight is stable.  Sleep is decreased to four hours nightly but is otherwise adequate.   PHYSICAL EXAMINATION:  GENERAL:  Well-nourished, well-developed female who  is in no acute distress.  Medium-built.  Hygiene is adequate.  No visible  tremors today, though she has complaints of subjective feelings of mild  nausea and shaky insides.  CIWA is gauged at less than 8.  VITAL SIGNS:  Height 5 feet 7 inches tall, weight 138 pounds.  Temperature  97.9, pulse 90, respirations 16, blood pressure 132/91.  HEAD:  Normocephalic and atraumatic.  EENT:  PERRL.  Sclera nonicteric.  Extraocular movements are normal.  No  rhinorrhea.  Oral hygiene is adequate.  Symmetrical palate rise.  NECK:  Supple.  No thyromegaly or lymphadenopathy.  CHEST:  Clear to auscultation.  BREAST:  Exam deferred.  CARDIOVASCULAR:  S1 and S2 heard.  No clicks, murmurs or gallops.  ABDOMEN:  Flat, soft, nontender, nondistended.  EXTREMITIES:  Pink, warm, good capillary refill.  SKIN:  Intact.  No signs of self-mutilation.  NEUROLOGIC:  Cranial nerves 2-12 are intact.  Sensory and motor are intact.  No asterixis.  No signs of EPS.  Cerebellar function is intact.  Romberg  without findings.  LABORATORY DATA:  WBC 7.4, hemoglobin 10.7, hematocrit 31.7, platelets  327,000.   Electrolytes with sodium 139, potassium 3.9, chloride 106, carbon  dioxide 23, BUN 10, creatinine 0.8, glucose 91.  Liver enzymes with SGOT 30,  SGPT 19, alkaline phosphatase 72 and total bilirubin 0.5.  Urine drug screen  is currently pending.  ETOH level was 170 mg/percent on presentation.  TSH  is within normal limits at 0.696 and her urinalysis is remarkable for  moderate bacteria and large leukocyte esterase.   MENTAL STATUS  EXAM:  Fully alert female.  Pleasant, cooperative with  appropriately affect.  Calm, well-focused.  Speech is articulate, fluent,  normal in tone and production.  Mood is depressed and anxious.  Thought  process logical and coherent.  No signs of psychosis.  No paranoia or flight  of ideas.  No homicidal thoughts.  Does have some vague suicidal ideation  and clearly felt suicidal yesterday.  Cognitively, she is intact and  oriented x3.  Insight is fair.  Quite a lot of denial about the amount of  substances that she is using.  Becomes more candid as discussion goes on.  Impulse control and judgment within normal limits.  Intellect within normal  limits.  Calculation is within normal limits.   DIAGNOSES:  AXIS I:  Major depression, initial episode severe.  Alcohol  abuse; rule out dependence.  AXIS II:  No diagnosis.  AXIS III:  Dysuria, rule out urinary tract infection, microcytic anemia.  AXIS IV:  Severe (parenting stress and problems with unemployment).  AXIS V:  Current 38; past year 19.   PLAN:  Involuntarily admit the patient with 15-minute checks in place.  We  are going to start her on a Librium protocol and detox her from the alcohol.  She will also get 100 mg of thiamine IM at this point and thiamine 100 mg  q.d. along with folic acid 1 mg daily.  We are going to collect another  urine for culture and sensitivity, gonorrhea and chlamydia probes and will  start her on Cipro 250 mg p.o. b.i.d. and, tomorrow, we will consider  starting her on   Wellbutrin 150 mg XL daily for her depression and will complete anemia  studies on her.  She is a menstruating female with problems with some  fatigue. She would like a follow-up with the [facility] and, at this  point, we are in agreement with that.  The patient is in agreement with the  plan.      Margaret A. Lorin Picket, N.P.      Anselm Jungling, MD  Electronically Signed    MAS/MEDQ  D:  03/01/2006  T:  03/01/2006  Job:  [id number]

## 2011-05-05 NOTE — Discharge Summary (Signed)
[name], [name]              ACCOUNT NO.:  [id number]   MEDICAL RECORD NO.:  [id number]          PATIENT TYPE:  IPS   LOCATION:  0301                          [facility]   PHYSICIAN:  Anselm Jungling, MD  DATE OF BIRTH:  1966-10-29   DATE OF ADMISSION:  02/28/2006  DATE OF DISCHARGE:  03/02/2006                                 DISCHARGE SUMMARY   IDENTIFYING DATA AND REASON FOR ADMISSION:  The patient is a 45 year old  widowed white female admitted for treatment of depression and alcohol abuse.  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 Please refer to the  admission note for further details pertaining to symptoms, circumstances and  history that led to her hospitalization. She was given an initial Axis I  diagnoses of major depressive episode, alcohol abuse, and assorted medical  problems.   MEDICAL AND LABORATORY:  The patient was medically and physically assessed  by the psychiatric nurse practitioner. She complained of dysuria at the time  of admission, and was subsequently treated for a urinary tract infection  with Cipro 250 mg b.i.d.. She was also given Pyridium 200 mg t.i.d. for  urinary discomfort. She was found to have macrocytic anemia, consistent with  her alcohol abuse. There were no other significant medical issues.   HOSPITAL COURSE:  The patient was admitted to the adult inpatient  psychiatric service. She was begun on a trial of Wellbutrin 150 mg XL daily  to address depressive symptoms. She was placed on a Librium alcohol  withdrawal protocol. She had minimal symptoms during her stay, with respect  to alcohol withdrawal.   She presented as a well-nourished, well-developed, articulate woman who was  favorably disposed towards her treatment here. She acknowledged problems  with alcohol  abuse and need to maintain abstinence following her discharge.  She was a good participant in treatment program and attended various  therapeutic groups and activities. She was absent suicidal ideation  throughout her entire inpatient stay.   On the third hospital day she felt ready for discharge from the inpatient  service. She was not having any alcohol withdrawal symptoms at that time,  her mood was improved and she was able to indicate sincerely and  convincingly that she had no suicidal ideation. She indicated that she was  enthusiastic about beginning treatment at the [facility] for individual  counseling and alcohol cessation programming.   AFTERCARE:  The patient was discharged with a plan to follow-up at the  [facility]  on the day following discharge, March 02, 2006.Marland Kitchen   DISCHARGE MEDICATIONS:  Cipro 250 mg b.i.d. for two additional days, and  Wellbutrin XL 150 mg daily.   DISCHARGE DIAGNOSES:  AXIS I: Major depressive episode, alcohol abuse.  AXIS II: Deferred.  AXIS III: HIV positive, urinary tract infection.  AXIS IV: Stressors severe.  AXIS V: Global assessment of functioning on discharge 65.  ______________________________  Anselm Jungling, MD  Electronically Signed     SPB/MEDQ  D:  03/05/2006  T:  03/05/2006  Job:  [id number]
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
## 2011-06-15 ENCOUNTER — Emergency Department (HOSPITAL_COMMUNITY): Payer: No Typology Code available for payment source

## 2011-06-15 ENCOUNTER — Emergency Department (HOSPITAL_COMMUNITY)
Admission: EM | Admit: 2011-06-15 | Discharge: 2011-06-15 | Disposition: A | Discharge status: Home / Self Care | Payer: No Typology Code available for payment source | Attending: Emergency Medicine | Admitting: Emergency Medicine

## 2011-06-15 DIAGNOSIS — M545 Low back pain, unspecified: Secondary | ICD-10-CM | POA: Insufficient documentation

## 2011-06-15 DIAGNOSIS — B1920 Unspecified viral hepatitis C without hepatic coma: Secondary | ICD-10-CM | POA: Insufficient documentation

## 2011-06-15 DIAGNOSIS — M542 Cervicalgia: Secondary | ICD-10-CM | POA: Insufficient documentation

## 2011-06-15 DIAGNOSIS — Z21 Asymptomatic human immunodeficiency virus [HIV] infection status: Secondary | ICD-10-CM | POA: Insufficient documentation

## 2011-06-15 DIAGNOSIS — M25519 Pain in unspecified shoulder: Secondary | ICD-10-CM | POA: Insufficient documentation

## 2011-06-15 DIAGNOSIS — M1990 Unspecified osteoarthritis, unspecified site: Secondary | ICD-10-CM | POA: Insufficient documentation

## 2011-07-02 ENCOUNTER — Emergency Department (HOSPITAL_COMMUNITY)
Admission: EM | Admit: 2011-07-02 | Discharge: 2011-07-02 | Disposition: A | Discharge status: Home / Self Care | Payer: No Typology Code available for payment source | Attending: Emergency Medicine | Admitting: Emergency Medicine

## 2011-07-02 DIAGNOSIS — M549 Dorsalgia, unspecified: Secondary | ICD-10-CM | POA: Insufficient documentation

## 2011-07-02 DIAGNOSIS — Z21 Asymptomatic human immunodeficiency virus [HIV] infection status: Secondary | ICD-10-CM | POA: Insufficient documentation

## 2011-07-02 DIAGNOSIS — M069 Rheumatoid arthritis, unspecified: Secondary | ICD-10-CM | POA: Insufficient documentation

## 2011-07-02 DIAGNOSIS — Z8619 Personal history of other infectious and parasitic diseases: Secondary | ICD-10-CM | POA: Insufficient documentation

## 2011-07-02 DIAGNOSIS — R269 Unspecified abnormalities of gait and mobility: Secondary | ICD-10-CM | POA: Insufficient documentation

## 2011-09-14 LAB — COMPREHENSIVE METABOLIC PANEL
AST: 26
Albumin: 3.4 — ABNORMAL LOW
Alkaline Phosphatase: 77
BUN: 4 — ABNORMAL LOW
BUN: 5 — ABNORMAL LOW
CO2: 24
Calcium: 9.6
Chloride: 104
Chloride: 105
Creatinine, Ser: 0.63
Creatinine, Ser: 0.66
GFR calc Af Amer: >60
GFR calc non Af Amer: >60
Glucose, Bld: 103 — ABNORMAL HIGH
Glucose, Bld: 93
Potassium: 3.4 — ABNORMAL LOW
Total Bilirubin: 0.7
Total Protein: 7
Total Protein: 7

## 2011-09-14 LAB — BASIC METABOLIC PANEL
BUN: 3 — ABNORMAL LOW
CO2: 22
CO2: 26
Calcium: 8.7
Chloride: 109
Chloride: 109
Creatinine, Ser: 0.58
GFR calc Af Amer: >60
Glucose, Bld: 107 — ABNORMAL HIGH
Sodium: 139

## 2011-09-14 LAB — RAPID URINE DRUG SCREEN, HOSP PERFORMED
Amphetamines: NOT DETECTED
Barbiturates: NOT DETECTED
Benzodiazepines: POSITIVE — AB
Cocaine: NOT DETECTED

## 2011-09-14 LAB — CBC
HCT: 33.1 — ABNORMAL LOW
Hemoglobin: 10.1 — ABNORMAL LOW
Hemoglobin: 10.7 — ABNORMAL LOW
MCHC: 31.8
MCHC: 31.9
MCHC: 32.8
MCV: 71.5 — ABNORMAL LOW
MCV: 71.7 — ABNORMAL LOW
MCV: 72.5 — ABNORMAL LOW
Platelets: 290
Platelets: 308
Platelets: 313
RBC: 4.34
RDW: 20.8 — ABNORMAL HIGH
RDW: 21.4 — ABNORMAL HIGH
WBC: 5.1
WBC: 6.6

## 2011-09-14 LAB — URINALYSIS, ROUTINE W REFLEX MICROSCOPIC
Bilirubin Urine: NEGATIVE
Hgb urine dipstick: NEGATIVE
Ketones, ur: NEGATIVE
Specific Gravity, Urine: 1.022
Urobilinogen, UA: 0.2

## 2011-09-14 LAB — PROTIME-INR: Prothrombin Time: 12.2

## 2011-09-14 LAB — DIFFERENTIAL
Eosinophils Relative: 4
Lymphocytes Relative: 38
Lymphs Abs: 1.9
Monocytes Absolute: 0.7
Monocytes Relative: 13 — ABNORMAL HIGH
Neutro Abs: 2.3

## 2011-09-14 LAB — OCCULT BLOOD X 1 CARD TO LAB, STOOL: Fecal Occult Bld: NEGATIVE

## 2011-09-14 LAB — CULTURE, BLOOD (ROUTINE X 2)

## 2011-09-14 LAB — URINE MICROSCOPIC-ADD ON

## 2011-09-14 LAB — PREGNANCY, URINE: Preg Test, Ur: NEGATIVE

## 2011-09-15 LAB — COMPREHENSIVE METABOLIC PANEL WITH GFR
ALT: 13
AST: 18
Albumin: 3.1 — ABNORMAL LOW
Alkaline Phosphatase: 70
BUN: 5 — ABNORMAL LOW
CO2: 25
Calcium: 9
Chloride: 106
Creatinine, Ser: 0.56
GFR calc non Af Amer: >60
Glucose, Bld: 106 — ABNORMAL HIGH
Potassium: 3.5
Sodium: 136
Total Bilirubin: 0.4
Total Protein: 6

## 2011-09-15 LAB — COMPREHENSIVE METABOLIC PANEL
AST: 24
BUN: 8
CO2: 25
Calcium: 9.1
Chloride: 103
Creatinine, Ser: 0.85
GFR calc Af Amer: >60
GFR calc non Af Amer: >60
Total Bilirubin: 0.5

## 2011-09-15 LAB — CBC
HCT: 27.6 — ABNORMAL LOW
HCT: 29 — ABNORMAL LOW
Hemoglobin: 8.9 — ABNORMAL LOW
Hemoglobin: 9.6 — ABNORMAL LOW
MCHC: 32.4
MCHC: 33
MCV: 71.6 — ABNORMAL LOW
MCV: 71.8 — ABNORMAL LOW
Platelets: 261
Platelets: 293
RBC: 3.85 — ABNORMAL LOW
RBC: 4.04
RDW: 16.7 — ABNORMAL HIGH
RDW: 17.6 — ABNORMAL HIGH
WBC: 5.7
WBC: 6

## 2011-09-15 LAB — DIFFERENTIAL
Basophils Absolute: 0
Basophils Absolute: 0
Basophils Relative: 0
Basophils Relative: 0
Eosinophils Absolute: 0.2
Eosinophils Absolute: 0.2
Eosinophils Relative: 3
Eosinophils Relative: 3
Lymphocytes Relative: 46
Lymphs Abs: 2.3
Lymphs Abs: 2.8
Monocytes Absolute: 0.8
Monocytes Relative: 14 — ABNORMAL HIGH
Neutro Abs: 2.2
Neutrophils Relative %: 36 — ABNORMAL LOW
Neutrophils Relative %: 43

## 2011-09-15 LAB — LIPASE, BLOOD: Lipase: 48

## 2011-09-27 LAB — BASIC METABOLIC PANEL
BUN: 9
Calcium: 9
Chloride: 97
Creatinine, Ser: 0.62
GFR calc Af Amer: >60

## 2011-09-27 LAB — DIFFERENTIAL
Basophils Relative: 1
Eosinophils Absolute: 0
Monocytes Absolute: 0.7
Monocytes Relative: 11
Neutrophils Relative %: 52

## 2011-09-27 LAB — CBC
MCV: 78.7
Platelets: 315
WBC: 5.8

## 2011-09-27 LAB — POCT CARDIAC MARKERS: Myoglobin, poc: 52.8

## 2012-11-13 ENCOUNTER — Encounter (HOSPITAL_COMMUNITY): Payer: Self-pay | Admitting: Emergency Medicine

## 2012-11-13 ENCOUNTER — Emergency Department (HOSPITAL_COMMUNITY): Payer: Self-pay

## 2012-11-13 ENCOUNTER — Observation Stay (HOSPITAL_COMMUNITY)
Admission: EM | Admit: 2012-11-13 | Discharge: 2012-11-14 | Disposition: A | Discharge status: Home / Self Care | Payer: Self-pay | Attending: Emergency Medicine | Admitting: Emergency Medicine

## 2012-11-13 DIAGNOSIS — R0602 Shortness of breath: Secondary | ICD-10-CM | POA: Insufficient documentation

## 2012-11-13 DIAGNOSIS — R079 Chest pain, unspecified: Principal | ICD-10-CM

## 2012-11-13 DIAGNOSIS — Z21 Asymptomatic human immunodeficiency virus [HIV] infection status: Secondary | ICD-10-CM | POA: Insufficient documentation

## 2012-11-13 DIAGNOSIS — Z79899 Other long term (current) drug therapy: Secondary | ICD-10-CM | POA: Insufficient documentation

## 2012-11-13 HISTORY — DX: Human immunodeficiency virus (HIV) disease: B20

## 2012-11-13 HISTORY — DX: Asymptomatic human immunodeficiency virus (hiv) infection status: Z21

## 2012-11-13 LAB — RAPID URINE DRUG SCREEN, HOSP PERFORMED
Amphetamines: NOT DETECTED
Barbiturates: NOT DETECTED
Benzodiazepines: NOT DETECTED
Cocaine: NOT DETECTED

## 2012-11-13 LAB — BASIC METABOLIC PANEL
BUN: 9 mg/dL (ref 6–23)
CO2: 21 mEq/L (ref 19–32)
Calcium: 9.3 mg/dL (ref 8.4–10.5)
Creatinine, Ser: 0.49 mg/dL — ABNORMAL LOW (ref 0.50–1.10)
Glucose, Bld: 143 mg/dL — ABNORMAL HIGH (ref 70–99)

## 2012-11-13 LAB — CBC
HCT: 38.8 % (ref 36.0–46.0)
Hemoglobin: 13.6 g/dL (ref 12.0–15.0)
MCH: 31.3 pg (ref 26.0–34.0)
MCV: 89.2 fL (ref 78.0–100.0)
RBC: 4.35 MIL/uL (ref 3.87–5.11)

## 2012-11-13 LAB — POCT I-STAT TROPONIN I: Troponin i, poc: 0 ng/mL (ref 0.00–0.08)

## 2012-11-13 MED ORDER — IBUPROFEN 200 MG PO TABS
800.0000 mg | ORAL_TABLET | Freq: Four times a day (QID) | ORAL | Status: DC | PRN
  Administered 2012-11-13 – 2012-11-14 (×2): 800 mg via ORAL
  Filled 2012-11-13: qty 4
  Filled 2012-11-13: qty 1

## 2012-11-13 MED ORDER — SODIUM CHLORIDE 0.9 % IV SOLN
1000.0000 mL | INTRAVENOUS | Status: DC
  Administered 2012-11-13: 1000 mL via INTRAVENOUS

## 2012-11-13 MED ORDER — ASPIRIN 325 MG PO TABS
325.0000 mg | ORAL_TABLET | Freq: Once | ORAL | Status: AC
  Administered 2012-11-13: 325 mg via ORAL
  Filled 2012-11-13: qty 1

## 2012-11-13 MED ORDER — SODIUM CHLORIDE 0.9 % IV SOLN: 1000.0000 mL | Freq: Once | INTRAVENOUS | Status: DC

## 2012-11-13 NOTE — ED Notes (Signed)
PT. GIVEN MOTRIN 800 MG PO FOR LOW BACK PAIN .

## 2012-11-13 NOTE — ED Provider Notes (Signed)
[name] is a 46 y.o. female transferred to CDU transferred from [facility] long sign out given to pod A. attending Dr. Fonnie Jarvis. Pt is on chest pain protocol pending coronary CT tomorrow a.m. past medical history is significant for HIV, she is an active daily smoker. Patient had 30 minutes of non-exertional non-positional chest pain while at rest as pressure-like and associated with shortness of breath.  Pt seen and examined at the bedside she is resting comfortably and is chest pain-free at this time. Lungs are clear to auscultation bilaterally, heart is regular rate and rhythm with no murmurs rubs or gallops, abdominal exam is benign with no tenderness to palpation or peritoneal signs.  Results for orders placed during the hospital encounter of 11/13/12  CBC      Component Value Range   WBC 6.1  4.0 - 10.5 K/uL   RBC 4.35  3.87 - 5.11 MIL/uL   Hemoglobin 13.6  12.0 - 15.0 g/dL   HCT 40.9  81.1 - 91.4 %   MCV 89.2  78.0 - 100.0 fL   MCH 31.3  26.0 - 34.0 pg   MCHC 35.1  30.0 - 36.0 g/dL   RDW 78.2  95.6 - 21.3 %   Platelets 269  150 - 400 K/uL  BASIC METABOLIC PANEL      Component Value Range   Sodium 134 (*) 135 - 145 mEq/L   Potassium 3.5  3.5 - 5.1 mEq/L   Chloride 100  96 - 112 mEq/L   CO2 21  19 - 32 mEq/L   Glucose, Bld 143 (*) 70 - 99 mg/dL   BUN 9  6 - 23 mg/dL   Creatinine, Ser 0.86 (*) 0.50 - 1.10 mg/dL   Calcium 9.3  8.4 - 57.8 mg/dL   GFR calc non Af Amer >90  >90 mL/min   GFR calc Af Amer >90  >90 mL/min  TROPONIN I      Component Value Range   Troponin I <0.30  <0.30 ng/mL  URINE RAPID DRUG SCREEN (HOSP PERFORMED)      Component Value Range   Opiates POSITIVE (*) NONE DETECTED   Cocaine NONE DETECTED  NONE DETECTED   Benzodiazepines NONE DETECTED  NONE DETECTED   Amphetamines NONE DETECTED  NONE DETECTED   Tetrahydrocannabinol NONE DETECTED  NONE DETECTED   Barbiturates NONE DETECTED  NONE DETECTED  POCT I-STAT TROPONIN I      Component Value Range   Troponin i, poc 0.00  0.00 - 0.08 ng/mL   Comment 3            Dg Chest 1 View  11/13/2012  *RADIOLOGY REPORT*  Clinical Data: Chest pain, smoker  CHEST - 1 VIEW  Comparison:  11/08/2010  Findings: The heart size and mediastinal contours are within normal limits.  Both lungs are clear.  IMPRESSION: No active disease.   Original Report Authenticated By: Judie Petit. Miles Costain, M.D.    Signout given to Pod A attending Dr. Rubin Payor at shift change    Wynetta Emery, PA-C 11/14/12 1505

## 2012-11-13 NOTE — ED Notes (Signed)
BMI = 18.8 ( WEIGHT = 120 LBS . HEIGHT = 5'7" )

## 2012-11-13 NOTE — ED Provider Notes (Signed)
History     CSN: [id number]  Arrival date & time 11/13/12  1206   First MD Initiated Contact with Patient 11/13/12 1234      Chief Complaint  Patient presents with  . Chest Pain    (Consider location/radiation/quality/duration/timing/severity/associated sxs/prior treatment) HPI Comments: 46 year old female current everyday smoker with a history of HIV presents emergency department with chief complaint of chest pain.  Onset of symptoms began this morning around 11 a.m. lasting approximately 30 minutes.  Pain is not exertional or positional and occurred while sitting as a passenger in a car.  She describes the pain as a pressure-like sensation that was 7/10, but currently states she is pain-free.  Associated symptoms include shortness of breath.  Patient reports that she had similar type pain in the past, but usually occurring at night.  Patient denies radiating pain, jaw pain, left arm pain, nausea, vomiting, diaphoresis, leg swelling, palpitations, syncope, cough, hemoptysis, fever, night sweats or chills.  Patient is a 46 y.o. female presenting with chest pain. The history is provided by the patient.  Chest Pain Primary symptoms include shortness of breath. Pertinent negatives for primary symptoms include no fever, no fatigue, no cough, no wheezing, no palpitations, no abdominal pain, no nausea, no vomiting and no dizziness.  Pertinent negatives for associated symptoms include no diaphoresis.     Past Medical History  Diagnosis Date  . HIV (human immunodeficiency virus infection)     Past Surgical History  Procedure Date  . Cesarean section     No family history on file.  History  Substance Use Topics  . Smoking status: Current Every Day Smoker -- 0.5 packs/day    Types: Cigarettes  . Smokeless tobacco: Not on file  . Alcohol Use: No    OB History    Grav Para Term Preterm Abortions TAB SAB Ect Mult Living                  Review of Systems  Constitutional:  Negative for fever, chills, diaphoresis, fatigue and unexpected weight change.  HENT: Negative for congestion, neck pain and neck stiffness.   Eyes: Negative for visual disturbance.  Respiratory: Positive for shortness of breath. Negative for apnea, cough, wheezing and stridor.   Cardiovascular: Positive for chest pain. Negative for palpitations and leg swelling.  Gastrointestinal: Negative for nausea, vomiting, abdominal pain, diarrhea and blood in stool.  Genitourinary: Negative for dysuria, urgency, hematuria and flank pain.  Musculoskeletal: Negative for myalgias, back pain and gait problem.  Skin: Negative for pallor.  Neurological: Negative for dizziness, syncope, light-headedness and headaches.  All other systems reviewed and are negative.    Allergies  Ciprofloxacin  Home Medications   Current Outpatient Rx  Name  Route  Sig  Dispense  Refill  . B COMPLEX-C PO TABS   Oral   Take 1 tablet by mouth daily.         Marland Kitchen GABAPENTIN 300 MG PO CAPS   Oral   Take 300 mg by mouth 3 (three) times daily.         Marland Kitchen HYDROCODONE-ACETAMINOPHEN 5-500 MG PO TABS   Oral   Take 1 tablet by mouth every 8 (eight) hours as needed. For pain.         . IBUPROFEN 200 MG PO TABS   Oral   Take 600 mg by mouth every 8 (eight) hours as needed. For pain.           BP 105/74  Pulse 79  Temp 97.9 F (36.6 C) (Oral)  Resp 18  SpO2 100%  LMP 10/30/2012  Physical Exam  Nursing note and vitals reviewed. Constitutional: She appears well-developed and well-nourished. No distress.  HENT:  Head: Normocephalic and atraumatic.  Eyes: Conjunctivae normal and EOM are normal. Pupils are equal, round, and reactive to light.  Neck: Normal range of motion. Neck supple. Normal carotid pulses and no JVD present. Carotid bruit is not present. No rigidity. Normal range of motion present.  Cardiovascular: Normal rate, regular rhythm, S1 normal, S2 normal, normal heart sounds, intact distal pulses and  normal pulses.  Exam reveals no gallop and no friction rub.   No murmur heard.      No pitting edema bilaterally, RRR, no aberrant sounds on auscultations, distal pulses intact, no carotid bruit or JVD.   Pulmonary/Chest: Effort normal and breath sounds normal. No accessory muscle usage or stridor. No respiratory distress. She exhibits no tenderness and no bony tenderness.  Abdominal: Bowel sounds are normal.       Soft non tender. Non pulsatile aorta.   Skin: Skin is warm, dry and intact. No rash noted. She is not diaphoretic. No cyanosis. Nails show no clubbing.    ED Course  Procedures (including critical care time)  Labs Reviewed  BASIC METABOLIC PANEL - Abnormal; Notable for the following:    Sodium 134 (*)     Glucose, Bld 143 (*)     Creatinine, Ser 0.49 (*)     All other components within normal limits  CBC  TROPONIN I   Dg Chest 1 View  11/13/2012  *RADIOLOGY REPORT*  Clinical Data: Chest pain, smoker  CHEST - 1 VIEW  Comparison:  11/08/2010  Findings: The heart size and mediastinal contours are within normal limits.  Both lungs are clear.  IMPRESSION: No active disease.   Original Report Authenticated By: Judie Petit. Shick, M.D.      1. Chest pain      Date: 11/13/2012  Rate: 87  Rhythm: normal sinus rhythm  QRS Axis: normal  Intervals: normal  ST/T Wave abnormalities: borderline flat T in V1  Conduction Disutrbances: none  Narrative Interpretation:   Old EKG Reviewed: changes noted     MDM  CP  Patient moved to CDU under chest pain protocol. Patient resting comfortably at present without return of chest pain. Lungs CTA bilaterally. S1/S2, RRR, no murmur. Abdomen soft, bowel sounds present. Strong distal pulses palpated all extremities. Sinus rhythm on monitor without ectopy. Troponin negative. [facility] accepting physician. Pt seen w Dr. Karma Ganja who agrees w transfer         Jaci Carrel, PA-C 11/16/12 [phone]

## 2012-11-13 NOTE — ED Notes (Signed)
Pt c/o chest pain that started around 11 am.  Describes it as a pressure.  Pain 7/10.  States she has had this pain before.  Denies cardiac hx.  Pt smokes.

## 2012-11-13 NOTE — ED Notes (Signed)
Pt transferred to CDU at [facility] via [facility].

## 2012-11-13 NOTE — ED Notes (Signed)
[facility] here to transport pt to CDU Obs.

## 2012-11-14 ENCOUNTER — Encounter (HOSPITAL_COMMUNITY): Payer: Self-pay | Admitting: Radiology

## 2012-11-14 ENCOUNTER — Observation Stay (HOSPITAL_COMMUNITY): Payer: Self-pay

## 2012-11-14 MED ORDER — NITROGLYCERIN 0.4 MG SL SUBL
0.4000 mg | SUBLINGUAL_TABLET | Freq: Once | SUBLINGUAL | Status: AC
  Administered 2012-11-14: 0.4 mg via SUBLINGUAL

## 2012-11-14 MED ORDER — NITROGLYCERIN 0.4 MG SL SUBL
SUBLINGUAL_TABLET | SUBLINGUAL | Status: AC
  Administered 2012-11-14: 0.4 mg via SUBLINGUAL
  Filled 2012-11-14: qty 25

## 2012-11-14 MED ORDER — ALBUTEROL SULFATE HFA 108 (90 BASE) MCG/ACT IN AERS
2.0000 | INHALATION_SPRAY | RESPIRATORY_TRACT | Status: DC
  Administered 2012-11-14: 2 via RESPIRATORY_TRACT
  Filled 2012-11-14: qty 6.7

## 2012-11-14 MED ORDER — METOPROLOL TARTRATE 1 MG/ML IV SOLN
INTRAVENOUS | Status: AC
  Filled 2012-11-14: qty 5

## 2012-11-14 MED ORDER — SODIUM CHLORIDE 0.9 % IV BOLUS (SEPSIS)
1000.0000 mL | Freq: Once | INTRAVENOUS | Status: AC
  Administered 2012-11-14: 1000 mL via INTRAVENOUS

## 2012-11-14 MED ORDER — IOHEXOL 350 MG/ML SOLN
80.0000 mL | Freq: Once | INTRAVENOUS | Status: AC | PRN
  Administered 2012-11-14: 80 mL via INTRAVENOUS

## 2012-11-14 MED ORDER — METOPROLOL TARTRATE 25 MG PO TABS
100.0000 mg | ORAL_TABLET | Freq: Once | ORAL | Status: AC
  Administered 2012-11-14: 100 mg via ORAL
  Filled 2012-11-14: qty 4

## 2012-11-14 NOTE — ED Provider Notes (Signed)
Medical screening examination/treatment/procedure(s) were performed by non-physician practitioner and as supervising physician I was immediately available for consultation/collaboration.   Gerell Fortson, MD 11/14/12 2348 

## 2012-11-14 NOTE — ED Notes (Signed)
Pt awaiting ride home.

## 2012-11-14 NOTE — ED Notes (Signed)
HR 60s - SR BP decreased to 85/57 after lopressor. Pt alert, warm and dry. Denies any chest pain. EDP updated and orders rec'd.

## 2012-11-14 NOTE — ED Notes (Signed)
Patient transported to CT 

## 2012-11-14 NOTE — ED Provider Notes (Signed)
10:02 AM Nonspecific chest pain.  The patient is feeling much better at this time.  Discharge home with outpatient cardiology followup.  CT coronary without evidence of obstructive disease  Dg Chest 1 View  11/13/2012  *RADIOLOGY REPORT*  Clinical Data: Chest pain, smoker  CHEST - 1 VIEW  Comparison:  11/08/2010  Findings: The heart size and mediastinal contours are within normal limits.  Both lungs are clear.  IMPRESSION: No active disease.   Original Report Authenticated By: Judie Petit. Miles Costain, M.D.    Ct Heart Morp W/cta Cor W/score W/ca W/cm &/or Wo/cm  11/14/2012  *RADIOLOGY REPORT*  INDICATION: 46 year old Caucasian female with chest pain.  CT ANGIOGRAPHY OF THE HEART, CORONARY ARTERY, STRUCTURE, AND MORPHOLOGY  COMPARISON:  Chest radiograph 11/13/2012  CONTRAST: 80mL OMNIPAQUE IOHEXOL 350 MG/ML SOLN  TECHNIQUE:  CT angiography of the coronary vessels was performed on a 256 channel system using prospective ECG gating.  A scout and ECG- gated noncontrast exam (for calcium scoring) were performed. Appropriate delay was determined by bolus tracking after injection of iodinated contrast, and an ECG-gated coronary CTA was performed with sub-mm slice collimation during late diastole.  Imaging post processing was performed on an independent workstation creating multiplanar and 3-D images, allowing for quantitative analysis of the heart and coronary arteries.  Note that this exam targets the heart and the chest was not imaged in its entirety.  PREMEDICATION: Lopressor  100 mg, P.O. Lopressor  zero mg, IV Nitroglycerin  400 mcg, sublingual.  FINDINGS: Technical quality: Adequate. Heart rate:  57.  CORONARY ARTERIES:  There is patient movement which resulted in a step off in the level of the mid coronary interval between the cardiac positions.  This was felt to not limited evaluation of the study.   Left Main: Normal sized vessel with no atherosclerotic disease or luminal stenosis. LAD:  Normal sized vessel with no  atherosclerotic disease or luminal stenosis. Diagonals:  Small D1 and D2 branches without atherosclerotic disease LCx:  Normal sized vessel with no atherosclerotic disease or luminal stenosis.  RCA:   Normal sized vessel with no atherosclerotic disease or luminal stenosis. PDA:        Normal sized vessel with no atherosclerotic disease or luminal stenosis. Dominance:  Right  CORONARY CALCIUM: Total Agatston Score:         0 MESA database percentile:     0  AORTA AND PULMONARY MEASUREMENTS:  Aortic root (21 - 40 mm):     28 mm mm at the annulus                               34 mm mm at the sinuses of Valsalva                               25 mm mm at the sinotubular                               junction                                Ascending aorta:  (< 40 mm):  27 mm mm Descending aorta:  (< 40 mm): 20 mm mm Main pulmonary artery: (< 30 mm): A 22 mm  OTHER FINDINGS: Review lung demonstrates  noted effusion or pneumonia.  There is mild bronchiolitis.  No pericardial fluid. Limited view of the upper abdomen is unremarkable.  Limited view of the skeleton is unremarkable.  IMPRESSION:  1.  No atherosclerotic disease of the coronary arteries.  No luminal stenosis. 2.  Total calcium score  = zero. 3.  Mild pulmonary bronchiolitis may relate to smoking.  Report was called to Dr. Patria Mane on 11/14/2012  at 9:50 a.m.   Original Report Authenticated By: Genevive Bi, M.D.   I personally reviewed the imaging tests through PACS system I reviewed available ER/hospitalization records through the EMR   Lyanne Co, MD 11/14/12 1002

## 2012-11-16 NOTE — ED Provider Notes (Signed)
Medical screening examination/treatment/procedure(s) were performed by non-physician practitioner and as supervising physician I was immediately available for consultation/collaboration.  Ethelda Chick, MD 11/16/12 [phone]

## 2013-09-15 ENCOUNTER — Encounter (HOSPITAL_COMMUNITY): Payer: Self-pay | Admitting: Nurse Practitioner

## 2013-09-15 ENCOUNTER — Emergency Department (HOSPITAL_COMMUNITY): Payer: No Typology Code available for payment source

## 2013-09-15 ENCOUNTER — Emergency Department (HOSPITAL_COMMUNITY)
Admission: EM | Admit: 2013-09-15 | Discharge: 2013-09-15 | Disposition: A | Discharge status: Home / Self Care | Payer: Self-pay | Attending: Emergency Medicine | Admitting: Emergency Medicine

## 2013-09-15 DIAGNOSIS — R112 Nausea with vomiting, unspecified: Secondary | ICD-10-CM | POA: Insufficient documentation

## 2013-09-15 DIAGNOSIS — Z881 Allergy status to other antibiotic agents status: Secondary | ICD-10-CM | POA: Insufficient documentation

## 2013-09-15 DIAGNOSIS — Z21 Asymptomatic human immunodeficiency virus [HIV] infection status: Secondary | ICD-10-CM | POA: Insufficient documentation

## 2013-09-15 DIAGNOSIS — R109 Unspecified abdominal pain: Secondary | ICD-10-CM | POA: Insufficient documentation

## 2013-09-15 DIAGNOSIS — F17200 Nicotine dependence, unspecified, uncomplicated: Secondary | ICD-10-CM | POA: Insufficient documentation

## 2013-09-15 DIAGNOSIS — K5900 Constipation, unspecified: Secondary | ICD-10-CM | POA: Insufficient documentation

## 2013-09-15 DIAGNOSIS — E119 Type 2 diabetes mellitus without complications: Secondary | ICD-10-CM | POA: Insufficient documentation

## 2013-09-15 DIAGNOSIS — Z79899 Other long term (current) drug therapy: Secondary | ICD-10-CM | POA: Insufficient documentation

## 2013-09-15 LAB — URINALYSIS, ROUTINE W REFLEX MICROSCOPIC
Nitrite: NEGATIVE
Protein, ur: NEGATIVE mg/dL
Specific Gravity, Urine: 1.007 (ref 1.005–1.030)
Urobilinogen, UA: 0.2 mg/dL (ref 0.0–1.0)

## 2013-09-15 LAB — URINE MICROSCOPIC-ADD ON

## 2013-09-15 LAB — COMPREHENSIVE METABOLIC PANEL
ALT: 20 U/L (ref 0–35)
CO2: 26 mEq/L (ref 19–32)
Calcium: 9.8 mg/dL (ref 8.4–10.5)
Chloride: 97 mEq/L (ref 96–112)
GFR calc Af Amer: >90 mL/min (ref >90)
GFR calc non Af Amer: >90 mL/min (ref >90)
Glucose, Bld: 96 mg/dL (ref 70–99)
Sodium: 133 mEq/L — ABNORMAL LOW (ref 135–145)
Total Bilirubin: 0.2 mg/dL — ABNORMAL LOW (ref 0.3–1.2)

## 2013-09-15 LAB — CBC WITH DIFFERENTIAL/PLATELET
Basophils Absolute: 0 10*3/uL (ref 0.0–0.1)
Basophils Relative: 0 % (ref 0–1)
Eosinophils Absolute: 0.2 10*3/uL (ref 0.0–0.7)
Hemoglobin: 11.7 g/dL — ABNORMAL LOW (ref 12.0–15.0)
MCHC: 33.6 g/dL (ref 30.0–36.0)
Neutro Abs: 3.5 10*3/uL (ref 1.7–7.7)
Neutrophils Relative %: 52 % (ref 43–77)
Platelets: 339 10*3/uL (ref 150–400)
RDW: 15 % (ref 11.5–15.5)

## 2013-09-15 MED ORDER — LIDOCAINE VISCOUS 2 % MT SOLN
15.0000 mL | Freq: Once | OROMUCOSAL | Status: AC
  Administered 2013-09-15: 15 mL via OROMUCOSAL
  Filled 2013-09-15: qty 15

## 2013-09-15 MED ORDER — POLYETHYLENE GLYCOL 3350 17 GM/SCOOP PO POWD: 17.0000 g | Freq: Every day | ORAL | Status: DC

## 2013-09-15 MED ORDER — ALUM & MAG HYDROXIDE-SIMETH 200-200-20 MG/5ML PO SUSP
15.0000 mL | Freq: Once | ORAL | Status: AC
  Administered 2013-09-15: 15 mL via ORAL
  Filled 2013-09-15: qty 30

## 2013-09-15 MED ORDER — OXYCODONE-ACETAMINOPHEN 5-325 MG PO TABS
2.0000 | ORAL_TABLET | Freq: Once | ORAL | Status: AC
  Administered 2013-09-15: 2 via ORAL
  Filled 2013-09-15: qty 2

## 2013-09-15 NOTE — ED Provider Notes (Signed)
CSN: [id number]     Arrival date & time 09/15/13  1621 History   First MD Initiated Contact with Patient 09/15/13 2102     Chief Complaint  Patient presents with  . Flank Pain   (Consider location/radiation/quality/duration/timing/severity/associated sxs/prior Treatment) Patient is a 47 y.o. female presenting with flank pain.  Flank Pain This is a new problem. The current episode started today. The problem occurs constantly. The problem has been unchanged. Associated symptoms include abdominal pain, nausea and vomiting. Pertinent negatives include no anorexia, change in bowel habit (chronically constipated), chest pain, chills, congestion, coughing, fever, myalgias, numbness, rash, sore throat, vertigo or weakness. The symptoms are aggravated by twisting. She has tried nothing for the symptoms.    Past Medical History  Diagnosis Date  . HIV (human immunodeficiency virus infection)   . Diabetes mellitus without complication    Past Surgical History  Procedure Laterality Date  . Cesarean section     History reviewed. No pertinent family history. History  Substance Use Topics  . Smoking status: Current Every Day Smoker -- 0.50 packs/day    Types: Cigarettes  . Smokeless tobacco: Not on file  . Alcohol Use: No   OB History   Grav Para Term Preterm Abortions TAB SAB Ect Mult Living                 Review of Systems  Constitutional: Negative for fever and chills.  HENT: Negative for congestion, sore throat and rhinorrhea.   Eyes: Negative for photophobia and visual disturbance.  Respiratory: Negative for cough and shortness of breath.   Cardiovascular: Negative for chest pain and leg swelling.  Gastrointestinal: Positive for nausea, vomiting and abdominal pain. Negative for diarrhea, constipation, anorexia and change in bowel habit (chronically constipated).  Endocrine: Negative for polyphagia and polyuria.  Genitourinary: Positive for flank pain. Negative for dysuria, vaginal  bleeding, vaginal discharge and enuresis.  Musculoskeletal: Negative for myalgias, back pain and gait problem.  Skin: Negative for color change and rash.  Neurological: Negative for dizziness, vertigo, syncope, weakness, light-headedness and numbness.  Hematological: Negative for adenopathy. Does not bruise/bleed easily.  All other systems reviewed and are negative.    Allergies  Ciprofloxacin  Home Medications   Current Outpatient Rx  Name  Route  Sig  Dispense  Refill  . HYDROcodone-acetaminophen (VICODIN) 5-500 MG per tablet   Oral   Take 1 tablet by mouth every 8 (eight) hours as needed. For pain.         Marland Kitchen ibuprofen (ADVIL,MOTRIN) 200 MG tablet   Oral   Take 600 mg by mouth every 8 (eight) hours as needed. For pain.         . polyethylene glycol powder (MIRALAX) powder   Oral   Take 17 g by mouth daily.   255 g   0    BP 87/49  Pulse 83  Temp(Src) 98.8 F (37.1 C) (Oral)  Resp 18  Ht 5\' 7"  (1.702 m)  Wt 127 lb (57.607 kg)  BMI 19.89 kg/m2  SpO2 98%  LMP 08/25/2013 Physical Exam  Vitals reviewed. Constitutional: She is oriented to person, place, and time. She appears well-developed and well-nourished.  HENT:  Head: Normocephalic and atraumatic.  Right Ear: External ear normal.  Left Ear: External ear normal.  Eyes: Conjunctivae and EOM are normal. Pupils are equal, round, and reactive to light.  Neck: Normal range of motion. Neck supple.  Cardiovascular: Normal rate, regular rhythm, normal heart sounds and intact distal pulses.  Pulmonary/Chest: Effort normal and breath sounds normal.  Abdominal: Soft. Bowel sounds are normal. There is no tenderness.  Musculoskeletal: Normal range of motion.       Lumbar back: She exhibits tenderness. She exhibits no bony tenderness.  Neurological: She is alert and oriented to person, place, and time.  Skin: Skin is warm and dry.    ED Course  Procedures (including critical care time) Labs Review Labs Reviewed   COMPREHENSIVE METABOLIC PANEL - Abnormal; Notable for the following:    Sodium 133 (*)    Total Bilirubin 0.2 (*)    All other components within normal limits  CBC WITH DIFFERENTIAL - Abnormal; Notable for the following:    Hemoglobin 11.7 (*)    HCT 34.8 (*)    All other components within normal limits  URINALYSIS, ROUTINE W REFLEX MICROSCOPIC - Abnormal; Notable for the following:    Leukocytes, UA TRACE (*)    All other components within normal limits  URINE MICROSCOPIC-ADD ON - Abnormal; Notable for the following:    Squamous Epithelial / LPF MANY (*)    All other components within normal limits   Imaging Review Ct Abdomen Pelvis Wo Contrast  09/15/2013   CLINICAL DATA:  Flank pain. Frequent urination.  EXAM: CT ABDOMEN AND PELVIS WITHOUT CONTRAST  TECHNIQUE: Multidetector CT imaging of the abdomen and pelvis was performed following the standard protocol without intravenous contrast.  COMPARISON:  05/25/2008  FINDINGS: Lung bases are clear. No effusions. Heart is normal size.  Liver, gallbladder, spleen, pancreas, adrenals and kidneys have an unremarkable unenhanced appearance. No renal or ureteral stones. No hydronephrosis. Urinary bladder, uterus and ovaries have an unremarkable unenhanced appearance. Trace free fluid in the cul-de-sac, likely physiologic.  Moderate stool burden throughout the colon. Appendix is visualized and is normal. Small bowel is decompressed.  Scattered calcifications in the aorta and iliac vessels. No aneurysm.  No acute bony abnormality.  IMPRESSION: No renal or ureteral stones. No hydronephrosis.  Moderate stool throughout the colon.   Electronically Signed   By: Charlett Nose M.D.   On: 09/15/2013 22:12    MDM   1. Constipation   2. Abdominal pain    47 y.o. female  with pertinent PMH of HIV, DM presents with abd pain and L flank pain x 2 weeks.  History as above with nausea and vomiting, however no fevers, respiratory symptoms.  Pt is on chronic narcotics  for back pain.  Physical exam as above with paraspinal L sided back tenderness with exacerbation of pain with leg movement, however this was not initially endorsed on my exam.  Initial history and physical with intermittent L flank pain radiating to groin with urinary frequency, unable to initially reproduce back tenderness or pain, concerning for possible nephrolithiasis.  Pt initially denied constipation.  CT scan obtained and as above consistent with constipation.  Spoke with pt at length who then claimed that although she had been having BM, they were small, painful, and frequent.  Miralax prescribed.  Standard abd precautions provided.  Pt will fu with PCP.        Labs and imaging as above reviewed by myself and attending,Dr. Ranae Palms, with whom case was discussed.   1. Constipation   2. Abdominal pain         Noel Gerold, MD 09/15/13 2352

## 2013-09-15 NOTE — ED Notes (Signed)
C/o flank pain, frequent urination and nausea for past 2 weeks, symptoms increasingly worse since onset.

## 2013-09-15 NOTE — ED Notes (Signed)
Pt c/o left sided flank pain x 2wks. Pt states the pain has progressively gotten worse, she has increased urination, bad taste in her mouth, and n/v. Pt states pain is constant with intermittent sharp pain and did not start after injury to area. Pt rates pain 10/10. Pt states she has taken otc medications and has had no relief. Pt states Ice packs makes pain feel a little better.

## 2013-09-15 NOTE — ED Notes (Addendum)
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 Pt took hydrocodone for pain a few hours ago.

## 2013-09-19 NOTE — ED Provider Notes (Signed)
I saw and evaluated the patient, reviewed the resident's note and I agree with the findings and plan.  Patient presents with 2 weeks of constant left flank pain and episodic abdominal cramping. Patient states her stools have been small and hard. She's had no vomiting or fever. Back pain is reproducible with palpation over her lumbar paraspinal muscles on the left. She has no midline tenderness. She has no weakness or numbness. She has no incontinence. Her abdomen is benign without focal tenderness, masses. CT scan shows significant stool in the colon. We'll treat for constipation and muscle strain.  Loren Racer, MD 09/19/13 [phone]

## 2014-08-17 ENCOUNTER — Emergency Department (HOSPITAL_COMMUNITY)
Admission: EM | Admit: 2014-08-17 | Discharge: 2014-08-17 | Disposition: A | Discharge status: Home / Self Care | Payer: Self-pay | Attending: Emergency Medicine | Admitting: Emergency Medicine

## 2014-08-17 ENCOUNTER — Emergency Department (HOSPITAL_COMMUNITY): Payer: Self-pay

## 2014-08-17 ENCOUNTER — Encounter (HOSPITAL_COMMUNITY): Payer: Self-pay | Admitting: Emergency Medicine

## 2014-08-17 DIAGNOSIS — Z3202 Encounter for pregnancy test, result negative: Secondary | ICD-10-CM | POA: Insufficient documentation

## 2014-08-17 DIAGNOSIS — M25539 Pain in unspecified wrist: Secondary | ICD-10-CM | POA: Insufficient documentation

## 2014-08-17 DIAGNOSIS — E119 Type 2 diabetes mellitus without complications: Secondary | ICD-10-CM | POA: Insufficient documentation

## 2014-08-17 DIAGNOSIS — G8929 Other chronic pain: Secondary | ICD-10-CM | POA: Insufficient documentation

## 2014-08-17 DIAGNOSIS — Z21 Asymptomatic human immunodeficiency virus [HIV] infection status: Secondary | ICD-10-CM | POA: Insufficient documentation

## 2014-08-17 DIAGNOSIS — F17200 Nicotine dependence, unspecified, uncomplicated: Secondary | ICD-10-CM | POA: Insufficient documentation

## 2014-08-17 DIAGNOSIS — M25532 Pain in left wrist: Secondary | ICD-10-CM

## 2014-08-17 LAB — COMPREHENSIVE METABOLIC PANEL
ALK PHOS: 69 U/L (ref 39–117)
ALT: 10 U/L (ref 0–35)
ANION GAP: 14 (ref 5–15)
AST: 16 U/L (ref 0–37)
Albumin: 3.8 g/dL (ref 3.5–5.2)
BUN: 9 mg/dL (ref 6–23)
CALCIUM: 9.4 mg/dL (ref 8.4–10.5)
CO2: 23 mEq/L (ref 19–32)
CREATININE: 0.62 mg/dL (ref 0.50–1.10)
Chloride: 102 mEq/L (ref 96–112)
GFR calc non Af Amer: >90 mL/min (ref >90)
Glucose, Bld: 90 mg/dL (ref 70–99)
POTASSIUM: 3.6 meq/L — AB (ref 3.7–5.3)
Sodium: 139 mEq/L (ref 137–147)
TOTAL PROTEIN: 8 g/dL (ref 6.0–8.3)
Total Bilirubin: <0.2 mg/dL — ABNORMAL LOW (ref 0.3–1.2)

## 2014-08-17 LAB — URINALYSIS, ROUTINE W REFLEX MICROSCOPIC
Bilirubin Urine: NEGATIVE
GLUCOSE, UA: NEGATIVE mg/dL
Hgb urine dipstick: NEGATIVE
Ketones, ur: NEGATIVE mg/dL
Leukocytes, UA: NEGATIVE
Nitrite: NEGATIVE
PROTEIN: NEGATIVE mg/dL
Specific Gravity, Urine: 1.015 (ref 1.005–1.030)
Urobilinogen, UA: 1 mg/dL (ref 0.0–1.0)
pH: 7 (ref 5.0–8.0)

## 2014-08-17 LAB — CBC WITH DIFFERENTIAL/PLATELET
BASOS PCT: 1 % (ref 0–1)
Basophils Absolute: 0 10*3/uL (ref 0.0–0.1)
EOS ABS: 0.2 10*3/uL (ref 0.0–0.7)
Eosinophils Relative: 3 % (ref 0–5)
HEMATOCRIT: 30 % — AB (ref 36.0–46.0)
HEMOGLOBIN: 9.3 g/dL — AB (ref 12.0–15.0)
Lymphocytes Relative: 45 % (ref 12–46)
Lymphs Abs: 2.4 10*3/uL (ref 0.7–4.0)
MCH: 21.7 pg — AB (ref 26.0–34.0)
MCHC: 31 g/dL (ref 30.0–36.0)
MCV: 69.9 fL — ABNORMAL LOW (ref 78.0–100.0)
MONO ABS: 0.6 10*3/uL (ref 0.1–1.0)
Monocytes Relative: 11 % (ref 3–12)
Neutro Abs: 2.1 10*3/uL (ref 1.7–7.7)
Neutrophils Relative %: 40 % — ABNORMAL LOW (ref 43–77)
Platelets: 360 10*3/uL (ref 150–400)
RBC: 4.29 MIL/uL (ref 3.87–5.11)
RDW: 17.5 % — ABNORMAL HIGH (ref 11.5–15.5)
WBC: 5.4 10*3/uL (ref 4.0–10.5)

## 2014-08-17 LAB — PREGNANCY, URINE: Preg Test, Ur: NEGATIVE

## 2014-08-17 LAB — LIPASE, BLOOD: Lipase: 54 U/L (ref 11–59)

## 2014-08-17 MED ORDER — ONDANSETRON 8 MG PO TBDP
8.0000 mg | ORAL_TABLET | Freq: Once | ORAL | Status: AC
  Administered 2014-08-17: 8 mg via ORAL
  Filled 2014-08-17: qty 1

## 2014-08-17 MED ORDER — NAPROXEN 500 MG PO TABS: 500.0000 mg | ORAL_TABLET | Freq: Two times a day (BID) | ORAL | Status: DC

## 2014-08-17 MED ORDER — ONDANSETRON HCL 4 MG PO TABS: 4.0000 mg | ORAL_TABLET | Freq: Four times a day (QID) | ORAL | Status: DC

## 2014-08-17 MED ORDER — OXYCODONE-ACETAMINOPHEN 5-325 MG PO TABS
2.0000 | ORAL_TABLET | Freq: Once | ORAL | Status: AC
  Administered 2014-08-17: 2 via ORAL
  Filled 2014-08-17: qty 2

## 2014-08-17 MED ORDER — METHOCARBAMOL 500 MG PO TABS: 500.0000 mg | ORAL_TABLET | Freq: Two times a day (BID) | ORAL | Status: DC

## 2014-08-17 MED ORDER — PREDNISONE 20 MG PO TABS: ORAL_TABLET | ORAL | Status: DC

## 2014-08-17 NOTE — ED Notes (Signed)
EKG performed d/t patient's original complaint of chest pain. EKG in patient's chart

## 2014-08-17 NOTE — ED Notes (Signed)
Vanessa Ralphs [phone] Password: Dorna Bloom

## 2014-08-17 NOTE — ED Provider Notes (Signed)
Medical screening examination/treatment/procedure(s) were performed by non-physician practitioner and as supervising physician I was immediately available for consultation/collaboration.   EKG Interpretation None      Rolland Porter, MD, Abram Sander   Janice Norrie, MD 08/17/14 2300

## 2014-08-17 NOTE — ED Provider Notes (Signed)
CSN: [id number]     Arrival date & time 08/17/14  1825 History   First MD Initiated Contact with Patient 08/17/14 2010     Chief Complaint  Patient presents with  . Wrist Pain     (Consider location/radiation/quality/duration/timing/severity/associated sxs/prior Treatment) HPI  48 year old female with history of non-insulin-dependent diabetes, history of HIV who presents complaining of left wrist pain. Patient states for the past month she has been having progressive worsening left thumb pain. Pain is gradual onset, sharp and achy, worsening with movement and minimally improved with ice, heat, and muscle cream ointment.  Her doctor initially tell her to stop texting.  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  She is LHD.  She have been cared for by her pain management specialist with treatment but it hasn't help.  She has hx of chronic neck pain and report occasional radicular pain down her L arm as well.  Her thumb pain does radiates up her L forearm.  Pain is intense today, and she vomited once.     Past Medical History  Diagnosis Date  . HIV (human immunodeficiency virus infection)   . Diabetes mellitus without complication    Past Surgical History  Procedure Laterality Date  . Cesarean section     History reviewed. No pertinent family history. History  Substance Use Topics  . Smoking status: Current Every Day Smoker -- 0.50 packs/day    Types: Cigarettes  . Smokeless tobacco: Not on file  . Alcohol Use: No   OB History   Grav Para Term Preterm Abortions TAB SAB Ect Mult Living                 Review of Systems  Constitutional: Negative for fever.  Musculoskeletal: Positive for arthralgias.  Skin: Negative for rash.  Neurological: Negative for numbness.      Allergies  Ciprofloxacin  Home Medications   Prior to Admission medications   Medication Sig Start  Date End Date Taking? Authorizing Provider  HYDROcodone-acetaminophen (NORCO/VICODIN) 5-325 MG per tablet Take 1 tablet by mouth every 6 (six) hours as needed for moderate pain.   Yes Historical Provider, MD  ibuprofen (ADVIL,MOTRIN) 200 MG tablet Take 600 mg by mouth every 8 (eight) hours as needed. For pain.   Yes Historical Provider, MD  Menthol, Topical Analgesic, (ZIMS MAX-FREEZE EX) Apply 1 application topically 3 (three) times daily as needed (pain).   Yes Historical Provider, MD   BP 119/82  Pulse 120  Temp(Src) 98.7 F (37.1 C) (Oral)  Resp 22  SpO2 98%  LMP 08/03/2014 Physical Exam  Nursing note and vitals reviewed. Constitutional: She appears well-developed and well-nourished. No distress.  HENT:  Head: Atraumatic.  Eyes: Conjunctivae are normal.  Neck: Neck supple.  Cardiovascular: Normal rate and regular rhythm.   Pulmonary/Chest: Effort normal and breath sounds normal.  Abdominal: Soft. There is no tenderness.  Musculoskeletal: She exhibits tenderness (L arm: tenderness along L thumb including PIP, MCP worsening with flexion/extension/opposition, with pain along extensor tendon to forearm.  normal compartment, brisk cap refill, radial pulse 2+, normal wrist flexion/extension).  Neurological: She is alert.  Skin: No rash noted.  Psychiatric: She has a normal mood and affect.    ED Course  Procedures (including critical care time)  11:36 PM Chronic progressive left wrist pain most significant to left thumb. On exam patient has reproducible pain however  no evidence of neurovascular compromise no evidence of infection and no evidence of septic arthritis. Suspect arthritis due to repetitive activities. We'll provide thumb spica wrist brace for stability and support. Rice therapy discussed. Patient did vomit once due to pain. Her labs are reassuring in the ER. Mild drops in her hemoglobin but pt report having hx of anemia and iron deficiency.  Recommend f/u with PCP for  further management.  Her wrist x-ray shows no acute finding and her EKG is without acute ischemic changes. Patient felt better after receiving distention and pain medication. She is stable for discharge. Hand specialist referral given as needed.  Labs Review Labs Reviewed  CBC WITH DIFFERENTIAL - Abnormal; Notable for the following:    Hemoglobin 9.3 (*)    HCT 30.0 (*)    MCV 69.9 (*)    MCH 21.7 (*)    RDW 17.5 (*)    Neutrophils Relative % 40 (*)    All other components within normal limits  COMPREHENSIVE METABOLIC PANEL - Abnormal; Notable for the following:    Potassium 3.6 (*)    Total Bilirubin <0.2 (*)    All other components within normal limits  LIPASE, BLOOD  PREGNANCY, URINE  URINALYSIS, ROUTINE W REFLEX MICROSCOPIC    Imaging Review No results found.   EKG Interpretation None      Date: 08/17/2014  Rate: 95  Rhythm: normal sinus rhythm  QRS Axis: normal  Intervals: normal  ST/T Wave abnormalities: normal  Conduction Disutrbances:none  Narrative Interpretation:   Old EKG Reviewed: unchanged    MDM   Final diagnoses:  Left wrist pain    BP 118/78  Pulse 76  Temp(Src) 98.7 F (37.1 C) (Oral)  Resp 20  SpO2 100%  LMP 08/03/2014  I have reviewed nursing notes and vital signs. I personally reviewed the imaging tests through PACS system  I reviewed available ER/hospitalization records thought the EMR     Domenic Moras, PA-C 08/17/14 [place], PA-C 08/17/14 2347

## 2014-08-17 NOTE — ED Provider Notes (Signed)
MSE was initiated and I personally evaluated the patient and placed orders (if any) at  8:43 PM on August 38, 2033.  48 year old female presents to the emergency department for chief complaint of left wrist pain. This pain has been worsening x2 days. 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 Pain is worse with movement of fingers. She denies direct trauma or injury. Patient also complaining of nausea and vomiting with onset prior to arrival. Patient has had one episode of nonbloody, nonbilious emesis. She endorses persistent nausea. DG wrist ordered as well as basic labs. Will move to acute side for further work up. Patient given Zofran and Percocet.  The patient appears stable so that the remainder of the MSE may be completed by another provider.   Filed Vitals:   08/17/14 1837  BP: 119/82  Pulse: 120  Temp: 98.7 F (37.1 C)  TempSrc: Oral  Resp: 22  SpO2: 98%     Antonietta Breach, PA-C 08/17/14 2045

## 2014-08-17 NOTE — Discharge Instructions (Signed)
Please follow up with hand specialist if you notice no improvement of your pain despite taking naproxen and wear splint for support.    Tendinitis Tendinitis is swelling and inflammation of the tendons. Tendons are band-like tissues that connect muscle to bone. Tendinitis commonly occurs in the:   Shoulders (rotator cuff).  Heels (Achilles tendon).  Elbows (triceps tendon). CAUSES Tendinitis is usually caused by overusing the tendon, muscles, and joints involved. When the tissue surrounding a tendon (synovium) becomes inflamed, it is called tenosynovitis. Tendinitis commonly develops in people whose jobs require repetitive motions. SYMPTOMS  Pain.  Tenderness.  Mild swelling. DIAGNOSIS Tendinitis is usually diagnosed by physical exam. Your health care provider may also order X-rays or other imaging tests. TREATMENT Your health care provider may recommend certain medicines or exercises for your treatment. HOME CARE INSTRUCTIONS   Use a sling or splint for as long as directed by your health care provider until the pain decreases.  Put ice on the injured area.  Put ice in a plastic bag.  Place a towel between your skin and the bag.  Leave the ice on for 15-20 minutes, 3-4 times a day, or as directed by your health care provider.  Avoid using the limb while the tendon is painful. Perform gentle range of motion exercises only as directed by your health care provider. Stop exercises if pain or discomfort increase, unless directed otherwise by your health care provider.  Only take over-the-counter or prescription medicines for pain, discomfort, or fever as directed by your health care provider. SEEK MEDICAL CARE IF:   Your pain and swelling increase.  You develop new, unexplained symptoms, especially increased numbness in the hands. MAKE SURE YOU:   Understand these instructions.  Will watch your condition.  Will get help right away if you are not doing well or get  worse. Document Released: 12/01/2000 Document Revised: 04/20/2014 Document Reviewed: 02/20/2011 [facility] Patient Information 2015 [place], [place]. This information is not intended to replace advice given to you by your health care provider. Make sure you discuss any questions you have with your health care provider.

## 2014-08-17 NOTE — ED Notes (Signed)
Patient called to triage, no answer 

## 2014-08-17 NOTE — ED Notes (Signed)
Patient is alert and oriented x3.  She was given DC instructions and follow up visit instructions.  Patient gave verbal understanding. She was DC ambulatory under her own power to home.  V/S stable.  He was not showing any signs of distress on DC 

## 2014-08-17 NOTE — ED Notes (Addendum)
Pt states she has been having L wrist pain x several weeks. States it started as tendonitis. Pt also states she has had n/v. Alert and oriented.

## 2014-08-18 NOTE — ED Provider Notes (Signed)
Medical screening examination/treatment/procedure(s) were performed by non-physician practitioner and as supervising physician I was immediately available for consultation/collaboration.  Richarda Blade, MD 08/18/14 [phone]

## 2014-11-09 ENCOUNTER — Emergency Department (HOSPITAL_COMMUNITY)
Admission: EM | Admit: 2014-11-09 | Discharge: 2014-11-09 | Discharge status: Against Medical Advice | Payer: Medicaid Other | Attending: Emergency Medicine | Admitting: Emergency Medicine

## 2014-11-09 ENCOUNTER — Encounter (HOSPITAL_COMMUNITY): Payer: Self-pay | Admitting: Emergency Medicine

## 2014-11-09 DIAGNOSIS — Z21 Asymptomatic human immunodeficiency virus [HIV] infection status: Secondary | ICD-10-CM | POA: Insufficient documentation

## 2014-11-09 DIAGNOSIS — M542 Cervicalgia: Secondary | ICD-10-CM | POA: Diagnosis present

## 2014-11-09 DIAGNOSIS — Z720 Tobacco use: Secondary | ICD-10-CM | POA: Insufficient documentation

## 2014-11-09 DIAGNOSIS — E119 Type 2 diabetes mellitus without complications: Secondary | ICD-10-CM | POA: Diagnosis not present

## 2014-11-09 NOTE — ED Notes (Addendum)
Pt reports knot on R side of neck for past 5 months. Pain radiates to R ear. Was seen at [facility] and was put on abx, but abx have not helped yet. Also has generalized body pain. Hx of HIV.

## 2014-11-09 NOTE — ED Notes (Signed)
Pt walked out the room and stated want leave AMA. Pt did not wait to sign AMA form.

## 2014-11-10 ENCOUNTER — Encounter (HOSPITAL_COMMUNITY): Payer: Self-pay | Admitting: Emergency Medicine

## 2014-11-10 DIAGNOSIS — E119 Type 2 diabetes mellitus without complications: Secondary | ICD-10-CM | POA: Insufficient documentation

## 2014-11-10 DIAGNOSIS — M79605 Pain in left leg: Secondary | ICD-10-CM | POA: Diagnosis not present

## 2014-11-10 DIAGNOSIS — Z21 Asymptomatic human immunodeficiency virus [HIV] infection status: Secondary | ICD-10-CM | POA: Diagnosis not present

## 2014-11-10 DIAGNOSIS — Z720 Tobacco use: Secondary | ICD-10-CM | POA: Insufficient documentation

## 2014-11-10 DIAGNOSIS — R05 Cough: Secondary | ICD-10-CM | POA: Diagnosis not present

## 2014-11-10 DIAGNOSIS — R590 Localized enlarged lymph nodes: Secondary | ICD-10-CM | POA: Insufficient documentation

## 2014-11-10 DIAGNOSIS — Z79899 Other long term (current) drug therapy: Secondary | ICD-10-CM | POA: Insufficient documentation

## 2014-11-10 DIAGNOSIS — R110 Nausea: Secondary | ICD-10-CM | POA: Diagnosis not present

## 2014-11-10 DIAGNOSIS — Z791 Long term (current) use of non-steroidal anti-inflammatories (NSAID): Secondary | ICD-10-CM | POA: Diagnosis not present

## 2014-11-10 DIAGNOSIS — M79606 Pain in leg, unspecified: Secondary | ICD-10-CM | POA: Diagnosis present

## 2014-11-10 DIAGNOSIS — M79604 Pain in right leg: Secondary | ICD-10-CM | POA: Insufficient documentation

## 2014-11-10 NOTE — ED Notes (Signed)
Pt. reports chronic bilateral leg pain for several months , denies injury , also reported " knot" at right upper neck with pain unrelieved by prescription pain medications .

## 2014-11-11 ENCOUNTER — Emergency Department (HOSPITAL_COMMUNITY): Payer: Medicaid Other

## 2014-11-11 ENCOUNTER — Emergency Department (HOSPITAL_COMMUNITY)
Admission: EM | Admit: 2014-11-11 | Discharge: 2014-11-11 | Disposition: A | Discharge status: Home / Self Care | Payer: Medicaid Other | Attending: Emergency Medicine | Admitting: Emergency Medicine

## 2014-11-11 DIAGNOSIS — M79605 Pain in left leg: Secondary | ICD-10-CM

## 2014-11-11 DIAGNOSIS — M79604 Pain in right leg: Secondary | ICD-10-CM

## 2014-11-11 DIAGNOSIS — R52 Pain, unspecified: Secondary | ICD-10-CM

## 2014-11-11 LAB — BASIC METABOLIC PANEL
Anion gap: 11 (ref 5–15)
BUN: 10 mg/dL (ref 6–23)
CO2: 25 mEq/L (ref 19–32)
Calcium: 9 mg/dL (ref 8.4–10.5)
Chloride: 101 mEq/L (ref 96–112)
Creatinine, Ser: 0.54 mg/dL (ref 0.50–1.10)
Glucose, Bld: 84 mg/dL (ref 70–99)
Potassium: 3.9 mEq/L (ref 3.7–5.3)
Sodium: 137 mEq/L (ref 137–147)

## 2014-11-11 LAB — URINALYSIS, ROUTINE W REFLEX MICROSCOPIC
Bilirubin Urine: NEGATIVE
Glucose, UA: NEGATIVE mg/dL
Hgb urine dipstick: NEGATIVE
Ketones, ur: NEGATIVE mg/dL
Nitrite: NEGATIVE
Protein, ur: NEGATIVE mg/dL
Specific Gravity, Urine: 1.008 (ref 1.005–1.030)
UROBILINOGEN UA: 0.2 mg/dL (ref 0.0–1.0)
pH: 5.5 (ref 5.0–8.0)

## 2014-11-11 LAB — URINE MICROSCOPIC-ADD ON

## 2014-11-11 LAB — CBC WITH DIFFERENTIAL/PLATELET
Basophils Absolute: 0.1 10*3/uL (ref 0.0–0.1)
Basophils Relative: 1 % (ref 0–1)
Eosinophils Absolute: 0.4 10*3/uL (ref 0.0–0.7)
Eosinophils Relative: 7 % — ABNORMAL HIGH (ref 0–5)
HEMATOCRIT: 26.6 % — AB (ref 36.0–46.0)
Hemoglobin: 8 g/dL — ABNORMAL LOW (ref 12.0–15.0)
LYMPHS ABS: 2.5 10*3/uL (ref 0.7–4.0)
Lymphocytes Relative: 51 % — ABNORMAL HIGH (ref 12–46)
MCH: 20.6 pg — ABNORMAL LOW (ref 26.0–34.0)
MCHC: 30.1 g/dL (ref 30.0–36.0)
MCV: 68.6 fL — ABNORMAL LOW (ref 78.0–100.0)
MONO ABS: 0.6 10*3/uL (ref 0.1–1.0)
Monocytes Relative: 12 % (ref 3–12)
NEUTROS ABS: 1.5 10*3/uL — AB (ref 1.7–7.7)
Neutrophils Relative %: 29 % — ABNORMAL LOW (ref 43–77)
Platelets: 379 10*3/uL (ref 150–400)
RBC: 3.88 MIL/uL (ref 3.87–5.11)
RDW: 17.8 % — ABNORMAL HIGH (ref 11.5–15.5)
WBC: 5.1 10*3/uL (ref 4.0–10.5)

## 2014-11-11 MED ORDER — OXYCODONE-ACETAMINOPHEN 5-325 MG PO TABS
2.0000 | ORAL_TABLET | Freq: Once | ORAL | Status: AC
  Administered 2014-11-11: 2 via ORAL
  Filled 2014-11-11: qty 2

## 2014-11-11 NOTE — ED Provider Notes (Signed)
CSN: [id number]     Arrival date & time 11/10/14  2247 History  This chart was scribed for Natalie Delay, MD by Evelene Croon, ED Scribe. This patient was seen in room D33C/D33C and the patient's care was started 2:06 AM.    Chief Complaint  Patient presents with  . Leg Pain     The history is provided by the patient. No language interpreter was used.     HPI Comments:  [name] is a 48 y.o. female with a h/o osteoathritis who presents to the Emergency Department complaining of bilateral lower extremity throbbing pain for several months. Pt reports pain from her bilateral ankles up to her hips. She states the pain has been intermittent since onset but has been constant for the past day. At this time she rates her pain 9/10 and states the pain feels like it is in her bones. The pain is exacerbated by walking. No alleviating factors noted. Pt also complains of intermittent fevers and rash  for the last month. She reports a max temp of 101 since onset and a max temp of 100 today.  She also complains of a lump to the right side of her neck with pain that radiates to her ear, mild cough, sore throat and nausea. She states her right ear is sore to touch. She was recently placed on bactrim which she has taken without improvement. She denies vomiting and diarrhea.  Pt has a h/o HIV, cannot recall last CD4 count. Her next appointment with her infectious disease doctor is in 1 month.   Past Medical History  Diagnosis Date  . HIV (human immunodeficiency virus infection)   . Diabetes mellitus without complication    Past Surgical History  Procedure Laterality Date  . Cesarean section     No family history on file. History  Substance Use Topics  . Smoking status: Current Every Day Smoker -- 0.50 packs/day    Types: Cigarettes  . Smokeless tobacco: Not on file  . Alcohol Use: No   OB History    No data available     Review of Systems  Constitutional: Positive for fever.  HENT:  Positive for ear pain and sore throat.   Respiratory: Positive for cough.   Gastrointestinal: Positive for nausea. Negative for vomiting and diarrhea.  Musculoskeletal: Positive for myalgias and arthralgias.  All other systems reviewed and are negative.     Allergies  Ciprofloxacin  Home Medications   Prior to Admission medications   Medication Sig Start Date End Date Taking? Authorizing Provider  HYDROcodone-acetaminophen (NORCO/VICODIN) 5-325 MG per tablet Take 1 tablet by mouth every 6 (six) hours as needed for moderate pain.    Historical Provider, MD  ibuprofen (ADVIL,MOTRIN) 200 MG tablet Take 600-1,000 mg by mouth every 6 (six) hours as needed for mild pain or moderate pain.    Historical Provider, MD  Menthol, Topical Analgesic, (ZIMS MAX-FREEZE EX) Apply 1 application topically 3 (three) times daily as needed (pain). On leg    Historical Provider, MD  methocarbamol (ROBAXIN) 500 MG tablet Take 1 tablet (500 mg total) by mouth 2 (two) times daily. Patient not taking: Reported on 11/09/2014 08/17/14   Domenic Moras, PA-C  naproxen (NAPROSYN) 500 MG tablet Take 1 tablet (500 mg total) by mouth 2 (two) times daily. Patient not taking: Reported on 11/09/2014 08/17/14   Domenic Moras, PA-C  ondansetron (ZOFRAN) 4 MG tablet Take 1 tablet (4 mg total) by mouth every 6 (six) hours. Patient not  taking: Reported on 11/09/2014 08/17/14   Domenic Moras, PA-C  predniSONE (DELTASONE) 20 MG tablet 3 tabs po day one, then 2 tabs daily x 4 days Patient not taking: Reported on 11/09/2014 08/17/14   Domenic Moras, PA-C   BP 126/96 mmHg  Pulse 92  Temp(Src) 97.9 F (36.6 C)  Resp 18  Ht 5\' 7"  (1.702 m)  Wt 122 lb (55.339 kg)  BMI 19.10 kg/m2  SpO2 98% Physical Exam  Constitutional: She is oriented to person, place, and time. She appears well-developed and well-nourished. No distress.  HENT:  Head: Normocephalic and atraumatic.  Right Ear: Tympanic membrane normal.  Left Ear: Tympanic membrane normal.   Mouth/Throat: Oropharynx is clear and moist.  Eyes: Conjunctivae are normal. Pupils are equal, round, and reactive to light. No scleral icterus.  Neck: Neck supple.  Right cervical lymphadenopathy  Cardiovascular: Normal rate, regular rhythm, normal heart sounds and intact distal pulses.   No murmur heard. Pulmonary/Chest: Effort normal and breath sounds normal. No stridor. No respiratory distress. She has no rales.  Abdominal: Soft. Bowel sounds are normal. She exhibits no distension. There is no tenderness.  Musculoskeletal: Normal range of motion.  BLE : nontender, no edema, 2+ pulses, strength and sensation intact, full ROM.   Neurological: She is alert and oriented to person, place, and time.  Skin: Skin is warm and dry. No rash noted.  Psychiatric: She has a normal mood and affect. Her behavior is normal.  Nursing note and vitals reviewed.   ED Course  Procedures   DIAGNOSTIC STUDIES:  Oxygen Saturation is 98% on RA, normal by my interpretation.    COORDINATION OF CARE:  2:21 AM Discussed treatment plan with pt at bedside and pt agreed to plan.  Labs Review Labs Reviewed  CBC WITH DIFFERENTIAL - Abnormal; Notable for the following:    Hemoglobin 8.0 (*)    HCT 26.6 (*)    MCV 68.6 (*)    MCH 20.6 (*)    RDW 17.8 (*)    Neutrophils Relative % 29 (*)    Lymphocytes Relative 51 (*)    Eosinophils Relative 7 (*)    Neutro Abs 1.5 (*)    All other components within normal limits  URINALYSIS, ROUTINE W REFLEX MICROSCOPIC - Abnormal; Notable for the following:    Leukocytes, UA TRACE (*)    All other components within normal limits  URINE MICROSCOPIC-ADD ON - Abnormal; Notable for the following:    Squamous Epithelial / LPF FEW (*)    All other components within normal limits  BASIC METABOLIC PANEL    Imaging Review Dg Chest 2 View  11/11/2014   CLINICAL DATA:  Neck pain radiating into the chest.  EXAM: CHEST  2 VIEW  COMPARISON:  11/13/2012  FINDINGS: Normal  heart size and mediastinal contours. No acute infiltrate or edema. Bilateral nipple shadows noted. No effusion or pneumothorax. No acute osseous findings.  IMPRESSION: No active cardiopulmonary disease.   Electronically Signed   By: Jorje Guild M.D.   On: 11/11/2014 03:42  All radiology studies independently viewed by me.      EKG Interpretation None      MDM   Final diagnoses:  Pain  Bilateral leg pain    48 year old female with history of HIV presenting with 1 month history of bilateral leg pain as well as right neck swelling. She was evaluated for the right neck swelling about a week ago and was referred to ENT. She has not made this  appointment yet. Her bigger complaint tonight is leg pain.  Her leg exam is unremarkable.  Plan to refer to PCP for further evaluation.    She also complained of intermittent fevers for the past month. She was afebrile here and she did not have a leukocytosis. She has no clear evidence of any infectious process.  She was anemic, consistent with recent outside records.    I personally performed the services described in this documentation, which was scribed in my presence. The recorded information has been reviewed and is accurate.     Natalie Delay, MD 11/11/14 [phone]

## 2014-11-11 NOTE — ED Notes (Signed)
The patient denies injury to her legs or neck.  She says they just started hurting her.

## 2014-11-11 NOTE — ED Notes (Signed)
Pt states "I just want to leave. I need to call my daughter." Spoke to patient regarding pain management. Pt attempting to call daughter. Reports wanting to take medications and will wait for disposition.

## 2014-12-14 ENCOUNTER — Encounter (HOSPITAL_COMMUNITY): Payer: Self-pay | Admitting: Emergency Medicine

## 2014-12-14 DIAGNOSIS — E119 Type 2 diabetes mellitus without complications: Secondary | ICD-10-CM | POA: Insufficient documentation

## 2014-12-14 DIAGNOSIS — H9201 Otalgia, right ear: Secondary | ICD-10-CM | POA: Diagnosis not present

## 2014-12-14 DIAGNOSIS — Z7952 Long term (current) use of systemic steroids: Secondary | ICD-10-CM | POA: Diagnosis not present

## 2014-12-14 DIAGNOSIS — Z720 Tobacco use: Secondary | ICD-10-CM | POA: Insufficient documentation

## 2014-12-14 DIAGNOSIS — Z21 Asymptomatic human immunodeficiency virus [HIV] infection status: Secondary | ICD-10-CM | POA: Diagnosis not present

## 2014-12-14 DIAGNOSIS — Z791 Long term (current) use of non-steroidal anti-inflammatories (NSAID): Secondary | ICD-10-CM | POA: Insufficient documentation

## 2014-12-14 DIAGNOSIS — M542 Cervicalgia: Secondary | ICD-10-CM | POA: Diagnosis present

## 2014-12-14 DIAGNOSIS — Z79899 Other long term (current) drug therapy: Secondary | ICD-10-CM | POA: Diagnosis not present

## 2014-12-14 DIAGNOSIS — J384 Edema of larynx: Secondary | ICD-10-CM | POA: Diagnosis not present

## 2014-12-14 NOTE — ED Notes (Signed)
Pt. reports chronic right ear ache and painful lump at right lateral neck for several months , denies injury , fever or chills, airway intact / respirations unlabored .

## 2014-12-15 ENCOUNTER — Encounter (HOSPITAL_COMMUNITY): Payer: Self-pay | Admitting: Radiology

## 2014-12-15 ENCOUNTER — Emergency Department (HOSPITAL_COMMUNITY): Payer: Medicaid Other

## 2014-12-15 ENCOUNTER — Emergency Department (HOSPITAL_COMMUNITY)
Admission: EM | Admit: 2014-12-15 | Discharge: 2014-12-15 | Disposition: A | Discharge status: Home / Self Care | Payer: Medicaid Other | Attending: Emergency Medicine | Admitting: Emergency Medicine

## 2014-12-15 DIAGNOSIS — R609 Edema, unspecified: Secondary | ICD-10-CM

## 2014-12-15 DIAGNOSIS — J387 Other diseases of larynx: Secondary | ICD-10-CM

## 2014-12-15 DIAGNOSIS — B20 Human immunodeficiency virus [HIV] disease: Secondary | ICD-10-CM

## 2014-12-15 LAB — CBC WITH DIFFERENTIAL/PLATELET
Basophils Absolute: 0 10*3/uL (ref 0.0–0.1)
Basophils Relative: 0 % (ref 0–1)
EOS PCT: 1 % (ref 0–5)
Eosinophils Absolute: 0.1 10*3/uL (ref 0.0–0.7)
HCT: 29.1 % — ABNORMAL LOW (ref 36.0–46.0)
Hemoglobin: 8.8 g/dL — ABNORMAL LOW (ref 12.0–15.0)
LYMPHS ABS: 1.8 10*3/uL (ref 0.7–4.0)
LYMPHS PCT: 16 % (ref 12–46)
MCH: 20.6 pg — ABNORMAL LOW (ref 26.0–34.0)
MCHC: 30.2 g/dL (ref 30.0–36.0)
MCV: 68.1 fL — AB (ref 78.0–100.0)
MONOS PCT: 11 % (ref 3–12)
Monocytes Absolute: 1.3 10*3/uL — ABNORMAL HIGH (ref 0.1–1.0)
Neutro Abs: 8.3 10*3/uL — ABNORMAL HIGH (ref 1.7–7.7)
Neutrophils Relative %: 72 % (ref 43–77)
PLATELETS: 417 10*3/uL — AB (ref 150–400)
RBC: 4.27 MIL/uL (ref 3.87–5.11)
RDW: 17.6 % — ABNORMAL HIGH (ref 11.5–15.5)
WBC: 11.5 10*3/uL — ABNORMAL HIGH (ref 4.0–10.5)

## 2014-12-15 LAB — COMPREHENSIVE METABOLIC PANEL
ALK PHOS: 106 U/L (ref 39–117)
ALT: 14 U/L (ref 0–35)
AST: 22 U/L (ref 0–37)
Albumin: 3.5 g/dL (ref 3.5–5.2)
Anion gap: 8 (ref 5–15)
BILIRUBIN TOTAL: 0.2 mg/dL — AB (ref 0.3–1.2)
BUN: 5 mg/dL — ABNORMAL LOW (ref 6–23)
CALCIUM: 8.9 mg/dL (ref 8.4–10.5)
CHLORIDE: 99 meq/L (ref 96–112)
CO2: 23 mmol/L (ref 19–32)
Creatinine, Ser: 0.58 mg/dL (ref 0.50–1.10)
GLUCOSE: 106 mg/dL — AB (ref 70–99)
POTASSIUM: 3.7 mmol/L (ref 3.5–5.1)
SODIUM: 130 mmol/L — AB (ref 135–145)
Total Protein: 7.8 g/dL (ref 6.0–8.3)

## 2014-12-15 LAB — I-STAT CHEM 8, ED
BUN: 5 mg/dL — AB (ref 6–23)
CALCIUM ION: 1.16 mmol/L (ref 1.12–1.23)
CHLORIDE: 101 meq/L (ref 96–112)
Creatinine, Ser: 0.6 mg/dL (ref 0.50–1.10)
Glucose, Bld: 108 mg/dL — ABNORMAL HIGH (ref 70–99)
HCT: 32 % — ABNORMAL LOW (ref 36.0–46.0)
Hemoglobin: 10.9 g/dL — ABNORMAL LOW (ref 12.0–15.0)
POTASSIUM: 3.7 mmol/L (ref 3.5–5.1)
Sodium: 135 mmol/L (ref 135–145)
TCO2: 20 mmol/L (ref 0–100)

## 2014-12-15 MED ORDER — ACETAMINOPHEN-CODEINE 120-12 MG/5ML PO SUSP: 5.0000 mL | Freq: Four times a day (QID) | ORAL | Status: DC | PRN

## 2014-12-15 MED ORDER — SODIUM CHLORIDE 0.9 % IV BOLUS (SEPSIS)
1000.0000 mL | Freq: Once | INTRAVENOUS | Status: AC
  Administered 2014-12-15: 1000 mL via INTRAVENOUS

## 2014-12-15 MED ORDER — IOHEXOL 300 MG/ML  SOLN
75.0000 mL | Freq: Once | INTRAMUSCULAR | Status: AC | PRN
Start: 2014-12-15 — End: 2014-12-15
  Administered 2014-12-15: 75 mL via INTRAVENOUS

## 2014-12-15 MED ORDER — ONDANSETRON HCL 4 MG/2ML IJ SOLN
4.0000 mg | Freq: Once | INTRAMUSCULAR | Status: AC
  Administered 2014-12-15: 4 mg via INTRAVENOUS
  Filled 2014-12-15: qty 2

## 2014-12-15 MED ORDER — HYDROMORPHONE HCL 1 MG/ML IJ SOLN
1.0000 mg | Freq: Once | INTRAMUSCULAR | Status: AC
  Administered 2014-12-15: 1 mg via INTRAVENOUS
  Filled 2014-12-15: qty 1

## 2014-12-15 MED ORDER — HYDROCODONE-ACETAMINOPHEN 7.5-325 MG/15ML PO SOLN: 15.0000 mL | Freq: Four times a day (QID) | ORAL | Status: DC | PRN

## 2014-12-15 NOTE — ED Notes (Signed)
Patient reports throat hurts to swallow, but requests another ginger ale. Updated patient on the plan of care.  Pain decreased to 2/10.

## 2014-12-15 NOTE — ED Notes (Signed)
Ginger ale provided for fluid challenge as patient requested.

## 2014-12-15 NOTE — ED Provider Notes (Addendum)
CSN: [id number]     Arrival date & time 12/14/14  2304 History   First MD Initiated Contact with Patient 12/15/14 0141    This chart was scribed for Blanchie Dessert, MD by Terressa Koyanagi, ED Scribe. This patient was seen in room A09C/A09C and the patient's care was started at 2:00 AM.  Chief Complaint  Patient presents with  . Otalgia  . Neck Pain   Patient is a 48 y.o. female presenting with neck pain. The history is provided by the patient. No language interpreter was used.  Neck Pain  PCP: No PCP Per Patient HPI Comments: [name] is a 48 y.o. female, with Hx of DM, HIV (Dx 16 years ago and never been on any meds), and daily tobacco use (0.5 ppd), who presents to the Emergency Department complaining of ongoing, atraumatic, worsening right ear pain with associated lump at right, lateral neck onset several months ago, worsening over the past few days. Pt also complains of associated fever (100F), cough (worse at night) and recent unexpected weight loss. Pt denies vomiting, diarrhea, ear drainage, taking any meds for HIV (however, pt reports she is in the process of getting her HIV meds), any daily meds,   Past Medical History  Diagnosis Date  . HIV (human immunodeficiency virus infection)   . Diabetes mellitus without complication    Past Surgical History  Procedure Laterality Date  . Cesarean section     No family history on file. History  Substance Use Topics  . Smoking status: Current Every Day Smoker -- 0.50 packs/day    Types: Cigarettes  . Smokeless tobacco: Not on file  . Alcohol Use: No   OB History    No data available     Review of Systems  Musculoskeletal: Positive for neck pain.    A complete 10 system review of systems was obtained and all systems are negative except as noted in the HPI and PMH.    Allergies  Ciprofloxacin  Home Medications   Prior to Admission medications   Medication Sig Start Date End Date Taking? Authorizing Provider   HYDROcodone-acetaminophen (NORCO/VICODIN) 5-325 MG per tablet Take 1 tablet by mouth every 6 (six) hours as needed for moderate pain.    Historical Provider, MD  ibuprofen (ADVIL,MOTRIN) 200 MG tablet Take 600-1,000 mg by mouth every 6 (six) hours as needed for mild pain or moderate pain.    Historical Provider, MD  Menthol, Topical Analgesic, (ZIMS MAX-FREEZE EX) Apply 1 application topically 3 (three) times daily as needed (pain). On leg    Historical Provider, MD  methocarbamol (ROBAXIN) 500 MG tablet Take 1 tablet (500 mg total) by mouth 2 (two) times daily. Patient not taking: Reported on 11/09/2014 08/17/14   Domenic Moras, PA-C  naproxen (NAPROSYN) 500 MG tablet Take 1 tablet (500 mg total) by mouth 2 (two) times daily. Patient not taking: Reported on 11/09/2014 08/17/14   Domenic Moras, PA-C  ondansetron (ZOFRAN) 4 MG tablet Take 1 tablet (4 mg total) by mouth every 6 (six) hours. Patient not taking: Reported on 11/09/2014 08/17/14   Domenic Moras, PA-C  predniSONE (DELTASONE) 20 MG tablet 3 tabs po day one, then 2 tabs daily x 4 days Patient not taking: Reported on 11/09/2014 08/17/14   Domenic Moras, PA-C   Triage Vitals: BP 116/64 mmHg  Pulse 81  Temp(Src) 98.8 F (37.1 C) (Oral)  Resp 20  Ht 5\' 2"  (1.575 m)  Wt 122 lb (55.339 kg)  BMI 22.31 kg/m2  SpO2 100% Physical Exam  Constitutional: She is oriented to person, place, and time. She appears well-developed and well-nourished. No distress.  HENT:  Head: Normocephalic and atraumatic.  Right Ear: Tympanic membrane and ear canal normal.  Left Ear: Tympanic membrane and ear canal normal.  Large, swollen, tender, firm lymph node in anterior cervical chain. No supraclavicular nodes, no left sided cervical nodes palpable. Hoarse voice.   Eyes: Conjunctivae and EOM are normal.  Neck: Neck supple. No tracheal deviation present.  Cardiovascular: Normal rate.   Pulmonary/Chest: Effort normal. No respiratory distress.  Musculoskeletal: Normal range  of motion.  Neurological: She is alert and oriented to person, place, and time.  Skin: Skin is warm and dry.  Psychiatric: She has a normal mood and affect. Her behavior is normal.  Nursing note and vitals reviewed.   ED Course  Procedures (including critical care time) DIAGNOSTIC STUDIES: Oxygen Saturation is 100% on RA, nl by my interpretation.    COORDINATION OF CARE: 2:06 AM-Discussed treatment plan with pt at bedside and pt agreed to plan.   Labs Review Labs Reviewed  CBC WITH DIFFERENTIAL - Abnormal; Notable for the following:    WBC 11.5 (*)    Hemoglobin 8.8 (*)    HCT 29.1 (*)    MCV 68.1 (*)    MCH 20.6 (*)    RDW 17.6 (*)    Platelets 417 (*)    Neutro Abs 8.3 (*)    Monocytes Absolute 1.3 (*)    All other components within normal limits  COMPREHENSIVE METABOLIC PANEL - Abnormal; Notable for the following:    Sodium 130 (*)    Glucose, Bld 106 (*)    BUN 5 (*)    Total Bilirubin 0.2 (*)    All other components within normal limits  I-STAT CHEM 8, ED - Abnormal; Notable for the following:    BUN 5 (*)    Glucose, Bld 108 (*)    Hemoglobin 10.9 (*)    HCT 32.0 (*)    All other components within normal limits    Imaging Review Ct Soft Tissue Neck W Contrast  12/15/2014   CLINICAL DATA:  Chronic right ear ache and painful lump in the right neck for months.  EXAM: CT NECK WITH CONTRAST  TECHNIQUE: Multidetector CT imaging of the neck was performed using the standard protocol following the bolus administration of intravenous contrast.  CONTRAST:  28mL OMNIPAQUE IOHEXOL 300 MG/ML  SOLN  COMPARISON:  None.  FINDINGS: Pharynx and larynx: There is a mass in the right supraglottic larynx with an extensive submucosal component component, up to 21 mm. Cystic change in the mass is likely necrosis rather than an obstructive laryngocele. The tumor infiltrates in the right paraglottic fat. The upper epiglottis is mildly thickened and may be a source for contralateral spread. No  evidence for cartilage invasion. Tumor approaches the glottis, without trans glottic spread. There is bilateral necrotic lymphadenopathy, up to 4 cm on the right. Nodes on the right are in level IIa and nodes on the left are level 2A and 3.  Salivary glands: Negative  Thyroid: Negative  Lymph nodes: Bilateral necrotic adenopathy as above  Vascular: Negative  Limited intracranial: Negative  Mastoids and visualized paranasal sinuses: Clear  Skeleton: No evidence of metastasis.  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   Electronically Signed   By: Jorje Guild M.D.   On:  12/15/2014 04:40     EKG Interpretation None      MDM   Final diagnoses:  Swelling  Supraglottic mass  HIV (human immunodeficiency virus infection)    Pt with hx of HIV for 16 years that has been untreated.  Pt now with worsening anterior neck pain and mass with hoarse voice and 15lb weight loss.  Pt also having mild cough without congestion worse at night but is a smoker.  VS stable and no resp component at this time sating 100% on RA.  Given untreated HIV status and new neck findings concern for cancer vs infection.  CBC, CMP, CT of neck pending.  Pt given pain control and IVF as she has not been eating due to having pain with swallowing.  4:44 AM CT shows a supraglottic squamous cell carcinoma with nodal metastsis.  5:12 AM Discussed pt with Dr. Wilburn Cornelia who stated this is an outpt work up with biopsy and treatment.  Wants to see pt in the office in the next few days.  Pt has no breathing difficulty and feeling better after pain meds.  Will give f/u with ID and meds for pain.  6:22 AM Patient now tolerating by mouth's. She still has mild throat pain. However patient discloses that she has appointment with ENT and infectious disease doctor on [place] within the next week. Recommended that she  follow-up with them and she was given pain control  I personally performed the services described in this documentation, which was scribed in my presence.  The recorded information has been reviewed and considered.    Blanchie Dessert, MD 12/15/14 [id number]  Blanchie Dessert, MD 12/15/14 8938  Blanchie Dessert, MD 12/15/14 [place], MD 12/15/14 [phone]

## 2014-12-15 NOTE — ED Notes (Signed)
Reviewed discharge instructions with the patient, daughter present at the bedside anxious in regards to diagnosis by ER physician and follow up.  Explained that follow up will provide more specifics, and how to access records on mychart.

## 2014-12-17 ENCOUNTER — Telehealth: Payer: Self-pay | Admitting: *Deleted

## 2014-12-17 ENCOUNTER — Emergency Department (HOSPITAL_COMMUNITY)
Admission: EM | Admit: 2014-12-17 | Discharge: 2014-12-17 | Disposition: A | Discharge status: Home / Self Care | Payer: Medicaid Other | Attending: Emergency Medicine | Admitting: Emergency Medicine

## 2014-12-17 ENCOUNTER — Encounter (HOSPITAL_COMMUNITY): Payer: Self-pay | Admitting: *Deleted

## 2014-12-17 DIAGNOSIS — Z720 Tobacco use: Secondary | ICD-10-CM | POA: Insufficient documentation

## 2014-12-17 DIAGNOSIS — Z21 Asymptomatic human immunodeficiency virus [HIV] infection status: Secondary | ICD-10-CM | POA: Diagnosis not present

## 2014-12-17 DIAGNOSIS — R221 Localized swelling, mass and lump, neck: Secondary | ICD-10-CM | POA: Diagnosis not present

## 2014-12-17 DIAGNOSIS — H9201 Otalgia, right ear: Secondary | ICD-10-CM | POA: Diagnosis present

## 2014-12-17 DIAGNOSIS — E119 Type 2 diabetes mellitus without complications: Secondary | ICD-10-CM | POA: Diagnosis not present

## 2014-12-17 MED ORDER — OXYCODONE-ACETAMINOPHEN 5-325 MG PO TABS
1.0000 | ORAL_TABLET | Freq: Once | ORAL | Status: AC
  Administered 2014-12-17: 1 via ORAL
  Filled 2014-12-17: qty 1

## 2014-12-17 MED ORDER — HYDROMORPHONE HCL 1 MG/ML IJ SOLN
1.0000 mg | Freq: Once | INTRAMUSCULAR | Status: AC
  Administered 2014-12-17: 1 mg via INTRAMUSCULAR
  Filled 2014-12-17: qty 1

## 2014-12-17 NOTE — ED Notes (Signed)
Attempted to contact regarding left CD for radiology. No answer

## 2014-12-17 NOTE — ED Notes (Signed)
Pt reports being diagnosed with cancer two days ago, was having pain to throat and neck, now also has severe pain to right ear.

## 2014-12-17 NOTE — ED Provider Notes (Signed)
CSN: [id number]     Arrival date & time 12/17/14  1234 History   First MD Initiated Contact with Patient 12/17/14 1542     Chief Complaint  Patient presents with  . Ear Pain     (Consider location/radiation/quality/duration/timing/severity/associated sxs/prior Treatment) The history is provided by the patient.   patient was recently diagnosed with a neck/supraglottic mass. Likely cancer. She has a history of HIV. She was seen in the ER 2 days ago. She states now is more pain in her right ear area. She been seen at [facility] and had previously been started on antibiotics around 3 weeks ago. States the pain improved but now is getting worse. There are previous been drainage from the ear but is not now. Some pain with swallowing. Pain is worse with moving her neck. Patient's medicine list here does not show it, but she goes to a pain clinic and is on Norco 10 mg. She states she still has these pills. States she thinks she just ate something here for pain. She states she has had some fever for over 3 weeks. She has follow-up with [facility] on Tuesday and states she also has [facility]. No difficulty swallowing. No cough. The pain is somewhat worse with moving her neck.  Past Medical History  Diagnosis Date  . HIV (human immunodeficiency virus infection)   . Diabetes mellitus without complication    Past Surgical History  Procedure Laterality Date  . Cesarean section     History reviewed. No pertinent family history. History  Substance Use Topics  . Smoking status: Current Every Day Smoker -- 0.50 packs/day    Types: Cigarettes  . Smokeless tobacco: Not on file  . Alcohol Use: No   OB History    No data available     Review of Systems  Constitutional: Positive for fever. Negative for chills.  HENT: Positive for ear pain. Negative for trouble swallowing.   Respiratory: Negative for shortness of breath.   Cardiovascular: Negative for leg swelling.   Gastrointestinal: Negative for abdominal pain.  Musculoskeletal: Positive for neck pain.      Allergies  Ciprofloxacin  Home Medications   Prior to Admission medications   Medication Sig Start Date End Date Taking? Authorizing Provider  acetaminophen-codeine 120-12 MG/5ML suspension Take 5 mLs by mouth every 6 (six) hours as needed for pain. 12/15/14   Blanchie Dessert, MD  ibuprofen (ADVIL,MOTRIN) 200 MG tablet Take 600-1,000 mg by mouth every 6 (six) hours as needed for mild pain or moderate pain.    Historical Provider, MD  Menthol, Topical Analgesic, (ZIMS MAX-FREEZE EX) Apply 1 application topically 3 (three) times daily as needed (pain). On leg    Historical Provider, MD  methocarbamol (ROBAXIN) 500 MG tablet Take 1 tablet (500 mg total) by mouth 2 (two) times daily. Patient not taking: Reported on 11/09/2014 08/17/14   Domenic Moras, PA-C  naproxen (NAPROSYN) 500 MG tablet Take 1 tablet (500 mg total) by mouth 2 (two) times daily. Patient not taking: Reported on 11/09/2014 08/17/14   Domenic Moras, PA-C  ondansetron (ZOFRAN) 4 MG tablet Take 1 tablet (4 mg total) by mouth every 6 (six) hours. Patient not taking: Reported on 11/09/2014 08/17/14   Domenic Moras, PA-C  predniSONE (DELTASONE) 20 MG tablet 3 tabs po day one, then 2 tabs daily x 4 days Patient not taking: Reported on 11/09/2014 08/17/14   Domenic Moras, PA-C   BP 108/66 mmHg  Pulse 83  Temp(Src)  98.4 F (36.9 C) (Oral)  Resp 18  SpO2 97% Physical Exam  Constitutional: She appears well-developed.  HENT:  Head: Atraumatic.  Possible mild effusion right TM. No drainage. No swelling. No erythema. Some abnormality to posterior tongue.  Neck:  Mass to cervical nodes posteriorly on right. Large. No erythema.  Pulmonary/Chest: Effort normal.  Abdominal: Soft.    ED Course  Procedures (including critical care time) Labs Review Labs Reviewed - No data to display  Imaging Review No results found.   EKG  Interpretation None      MDM   Final diagnoses:  Neck mass    Patient with pain to her right neck and ear. Likely due to the likely malignancy. Does not appear to be infection this time. Has short-term follow-up with ENT. Also has pain medicines for home. Will discharge.    Jasper Riling. Alvino Chapel, MD 12/17/14 1753

## 2014-12-17 NOTE — Telephone Encounter (Signed)
Pt's son called wanting to speak to Dr Vista Mink regarding pt's pain.  She is in the ED and has cancer.  He is asking for someone to come over and see her.  Advised that he contact the ED with any concerns since she is not an established patient of Dr Vista Mink.  Transferred him to the ED.

## 2014-12-21 ENCOUNTER — Encounter (HOSPITAL_COMMUNITY): Payer: Self-pay | Admitting: *Deleted

## 2014-12-21 ENCOUNTER — Emergency Department (HOSPITAL_COMMUNITY)
Admission: EM | Admit: 2014-12-21 | Discharge: 2014-12-21 | Disposition: A | Discharge status: Home / Self Care | Payer: Medicaid Other | Attending: Emergency Medicine | Admitting: Emergency Medicine

## 2014-12-21 DIAGNOSIS — Z7952 Long term (current) use of systemic steroids: Secondary | ICD-10-CM | POA: Insufficient documentation

## 2014-12-21 DIAGNOSIS — Z21 Asymptomatic human immunodeficiency virus [HIV] infection status: Secondary | ICD-10-CM | POA: Insufficient documentation

## 2014-12-21 DIAGNOSIS — Z720 Tobacco use: Secondary | ICD-10-CM | POA: Insufficient documentation

## 2014-12-21 DIAGNOSIS — M542 Cervicalgia: Secondary | ICD-10-CM

## 2014-12-21 DIAGNOSIS — R221 Localized swelling, mass and lump, neck: Secondary | ICD-10-CM | POA: Diagnosis not present

## 2014-12-21 DIAGNOSIS — Z791 Long term (current) use of non-steroidal anti-inflammatories (NSAID): Secondary | ICD-10-CM | POA: Diagnosis not present

## 2014-12-21 DIAGNOSIS — Z79899 Other long term (current) drug therapy: Secondary | ICD-10-CM | POA: Insufficient documentation

## 2014-12-21 DIAGNOSIS — E119 Type 2 diabetes mellitus without complications: Secondary | ICD-10-CM | POA: Diagnosis not present

## 2014-12-21 MED ORDER — OXYCODONE-ACETAMINOPHEN 5-325 MG PO TABS: 1.0000 | ORAL_TABLET | ORAL | Status: DC | PRN

## 2014-12-21 MED ORDER — HYDROMORPHONE HCL 1 MG/ML IJ SOLN
2.0000 mg | Freq: Once | INTRAMUSCULAR | Status: AC
  Administered 2014-12-21: 2 mg via INTRAMUSCULAR
  Filled 2014-12-21: qty 2

## 2014-12-21 NOTE — ED Notes (Signed)
Pt was seen here on 12/31 for same. Recently diagnosed with throat cancer and having severe neck and right ear pain, mass has increased in size since last visit. Airway intact. Pt taking liquid pain meds at home due to difficulty swallowing pills.

## 2014-12-21 NOTE — ED Provider Notes (Signed)
CSN: [id number]     Arrival date & time 12/21/14  1818 History   First MD Initiated Contact with Patient 12/21/14 2055     Chief Complaint  Patient presents with  . Neck Pain     (Consider location/radiation/quality/duration/timing/severity/associated sxs/prior Treatment) Patient is a 49 y.o. female presenting with neck pain. The history is provided by the patient. No language interpreter was used.  Neck Pain Pain location:  R side Associated symptoms: no fever   Associated symptoms comment:  Recent diagnosis of supraglottic squamous cell carcinoma with ENT follow up scheduled for tomorrow. She is here for pain management, taking hydrocodone without relief at home. She is able to swallow. She denies fever.    Past Medical History  Diagnosis Date  . HIV (human immunodeficiency virus infection)   . Diabetes mellitus without complication    Past Surgical History  Procedure Laterality Date  . Cesarean section     History reviewed. No pertinent family history. History  Substance Use Topics  . Smoking status: Current Every Day Smoker -- 0.50 packs/day    Types: Cigarettes  . Smokeless tobacco: Not on file  . Alcohol Use: No   OB History    No data available     Review of Systems  Constitutional: Negative for fever and chills.  HENT: Negative.  Negative for trouble swallowing.   Respiratory: Negative.   Cardiovascular: Negative.   Gastrointestinal: Negative.   Musculoskeletal: Positive for neck pain.  Skin: Negative.   Neurological: Negative.       Allergies  Ciprofloxacin  Home Medications   Prior to Admission medications   Medication Sig Start Date End Date Taking? Authorizing Provider  acetaminophen-codeine 120-12 MG/5ML suspension Take 5 mLs by mouth every 6 (six) hours as needed for pain. 12/15/14   Blanchie Dessert, MD  ibuprofen (ADVIL,MOTRIN) 200 MG tablet Take 600-1,000 mg by mouth every 6 (six) hours as needed for mild pain or moderate pain.    Historical  Provider, MD  Menthol, Topical Analgesic, (ZIMS MAX-FREEZE EX) Apply 1 application topically 3 (three) times daily as needed (pain). On leg    Historical Provider, MD  methocarbamol (ROBAXIN) 500 MG tablet Take 1 tablet (500 mg total) by mouth 2 (two) times daily. Patient not taking: Reported on 11/09/2014 08/17/14   Domenic Moras, PA-C  naproxen (NAPROSYN) 500 MG tablet Take 1 tablet (500 mg total) by mouth 2 (two) times daily. Patient not taking: Reported on 11/09/2014 08/17/14   Domenic Moras, PA-C  ondansetron (ZOFRAN) 4 MG tablet Take 1 tablet (4 mg total) by mouth every 6 (six) hours. Patient not taking: Reported on 11/09/2014 08/17/14   Domenic Moras, PA-C  predniSONE (DELTASONE) 20 MG tablet 3 tabs po day one, then 2 tabs daily x 4 days Patient not taking: Reported on 11/09/2014 08/17/14   Domenic Moras, PA-C   BP 120/69 mmHg  Pulse 69  Temp(Src) 97.4 F (36.3 C) (Oral)  Resp 20  SpO2 100% Physical Exam  Constitutional: She is oriented to person, place, and time. She appears well-developed and well-nourished.  HENT:  Large, firm mass right neck below mandibular angle. No tracheal deviation.   Neck: Normal range of motion. Neck supple.  Cardiovascular: Normal rate.   Pulmonary/Chest: Effort normal.  Neurological: She is alert and oriented to person, place, and time.  Skin: Skin is warm and dry. No erythema.  Psychiatric: She has a normal mood and affect.    ED Course  Procedures (including critical care time) Labs Review  Labs Reviewed - No data to display  Imaging Review No results found.   EKG Interpretation None      MDM   Final diagnoses:  None    1. Neck pain 2. Neck mass  She is feeling better after IM Dilaudid. Feel she can be discharged home with outpatient follow up for further management of neck mass in place for tomorrow. She is drinking fluids here, no trouble swallowing.     Dewaine Oats, PA-C 12/21/14 2152  Sharyon Cable, MD 12/22/14 [phone]

## 2014-12-21 NOTE — Discharge Instructions (Signed)
KEEP YOU SCHEDULED APPOINTMENT TOMORROW WITH ENT. RETURN HERE WITH ANY DIFFICULTY SWALLOWING, FEVER, SEVERE PAIN OR NEW CONCERN.

## 2014-12-23 ENCOUNTER — Encounter (HOSPITAL_COMMUNITY): Payer: Self-pay | Admitting: *Deleted

## 2014-12-23 ENCOUNTER — Emergency Department (HOSPITAL_COMMUNITY)
Admission: EM | Admit: 2014-12-23 | Discharge: 2014-12-23 | Disposition: A | Discharge status: Home / Self Care | Payer: Medicaid Other | Attending: Emergency Medicine | Admitting: Emergency Medicine

## 2014-12-23 DIAGNOSIS — Z21 Asymptomatic human immunodeficiency virus [HIV] infection status: Secondary | ICD-10-CM | POA: Diagnosis not present

## 2014-12-23 DIAGNOSIS — M542 Cervicalgia: Secondary | ICD-10-CM

## 2014-12-23 DIAGNOSIS — E119 Type 2 diabetes mellitus without complications: Secondary | ICD-10-CM | POA: Diagnosis not present

## 2014-12-23 DIAGNOSIS — D4989 Neoplasm of unspecified behavior of other specified sites: Secondary | ICD-10-CM | POA: Insufficient documentation

## 2014-12-23 DIAGNOSIS — C760 Malignant neoplasm of head, face and neck: Secondary | ICD-10-CM

## 2014-12-23 DIAGNOSIS — Z720 Tobacco use: Secondary | ICD-10-CM | POA: Insufficient documentation

## 2014-12-23 MED ORDER — HYDROMORPHONE HCL 1 MG/ML IJ SOLN
2.0000 mg | Freq: Once | INTRAMUSCULAR | Status: AC
  Administered 2014-12-23: 2 mg via INTRAMUSCULAR
  Filled 2014-12-23: qty 2

## 2014-12-23 NOTE — ED Notes (Signed)
Pt has a known cancerous tumor on the right side of her neck, in due to continued pain, has been seen for this already, is being followed with oncologist, pt states she is out of pain medication at home

## 2014-12-23 NOTE — ED Provider Notes (Signed)
CSN: [id number]     Arrival date & time 12/23/14  1337 History  This chart was scribed for non-physician practitioner, Noland Fordyce, PA-C working with Ezequiel Essex, MD by Tula Nakayama, ED scribe. This patient was seen in room TR11C/TR11C and the patient's care was started at 2:18 PM   Chief Complaint  Patient presents with  . Tumor   The history is provided by the patient. No language interpreter was used.   HPI Comments: [name] is a 49 y.o. female who presents to the Emergency Department complaining of constant pain associated with a tumor on the left side of her neck. Pain is 8/10 at this time. She notes pain with swallowing as an associated symptom. Pt saw [facility] yesterday and was prescribed Vicodin with minimal relief. Pt notes that she has a follow-up appointment next Thursday with pain management. She also reports that she has an appointment at [facility] on Friday, 12/25/14. She denies allergies to medications. Pt also denies fever as an associated symptom.  Past Medical History  Diagnosis Date  . HIV (human immunodeficiency virus infection)   . Diabetes mellitus without complication    Past Surgical History  Procedure Laterality Date  . Cesarean section     History reviewed. No pertinent family history. History  Substance Use Topics  . Smoking status: Current Every Day Smoker -- 0.50 packs/day    Types: Cigarettes  . Smokeless tobacco: Not on file  . Alcohol Use: No   OB History    No data available     Review of Systems  Constitutional: Negative for fever.  HENT: Negative for trouble swallowing.   Musculoskeletal: Positive for neck pain.  All other systems reviewed and are negative.   Allergies  Ciprofloxacin  Home Medications   Prior to Admission medications   Medication Sig Start Date End Date Taking? Authorizing Provider  acetaminophen-codeine 120-12 MG/5ML suspension Take 5 mLs by mouth every 6 (six) hours as needed for pain. 12/15/14    Blanchie Dessert, MD  ibuprofen (ADVIL,MOTRIN) 200 MG tablet Take 600-1,000 mg by mouth every 6 (six) hours as needed for mild pain or moderate pain.    Historical Provider, MD  Menthol, Topical Analgesic, (ZIMS MAX-FREEZE EX) Apply 1 application topically 3 (three) times daily as needed (pain). On leg    Historical Provider, MD  methocarbamol (ROBAXIN) 500 MG tablet Take 1 tablet (500 mg total) by mouth 2 (two) times daily. Patient not taking: Reported on 11/09/2014 08/17/14   Domenic Moras, PA-C  naproxen (NAPROSYN) 500 MG tablet Take 1 tablet (500 mg total) by mouth 2 (two) times daily. Patient not taking: Reported on 11/09/2014 08/17/14   Domenic Moras, PA-C  ondansetron (ZOFRAN) 4 MG tablet Take 1 tablet (4 mg total) by mouth every 6 (six) hours. Patient not taking: Reported on 11/09/2014 08/17/14   Domenic Moras, PA-C  oxyCODONE-acetaminophen (PERCOCET/ROXICET) 5-325 MG per tablet Take 1-2 tablets by mouth every 4 (four) hours as needed for severe pain. 12/21/14   Shari A Upstill, PA-C  predniSONE (DELTASONE) 20 MG tablet 3 tabs po day one, then 2 tabs daily x 4 days Patient not taking: Reported on 11/09/2014 08/17/14   Domenic Moras, PA-C   BP 108/67 mmHg  Pulse 79  Temp(Src) 98.2 F (36.8 C) (Oral)  Resp 18  Ht 5\' 7"  (1.702 m)  Wt 125 lb (56.7 kg)  BMI 19.57 kg/m2  SpO2 99% Physical Exam  Constitutional: She is oriented to person, place, and time. She  appears well-developed and well-nourished.  HENT:  Head: Normocephalic and atraumatic.  Minimal trismus; no airway involvement  Eyes: EOM are normal.  Neck: Normal range of motion.  Right-sided neck mass with erythema and tenderness; tenderness under right side of mandible; no midline cervical spinal tenderness; limited ROM to the right due to size of mass  Cardiovascular: Normal rate.   Pulmonary/Chest: Effort normal.  Musculoskeletal: Normal range of motion.  Neurological: She is alert and oriented to person, place, and time.  Skin: Skin is  warm and dry.  Psychiatric: She has a normal mood and affect. Her behavior is normal.  Nursing note and vitals reviewed.   ED Course  Procedures (including critical care time) DIAGNOSTIC STUDIES: Oxygen Saturation is 96% on RA, adequate by my interpretation.    COORDINATION OF CARE: 2:24 PM Discussed treatment plan with pt and she agreed to plan.  Labs Review Labs Reviewed - No data to display  Imaging Review No results found.   EKG Interpretation None      MDM   Final diagnoses:  Neck pain  Cancer of neck    Pt presenting to ED with c/o severe right sided neck pain due to a cancerous mass.  Pt currently being followed by oncology at [facility] as well as [facility].  Pt requesting pain medication in ED.  Pt is afebrile, non-toxic appearing, no respiratory distress. IM dilaudid given in ED. Pt has f/u with pain management next Thursday, 12/31/14.  Advised to f/u as scheduled for Friday in [facility]. Return precautions provided. Pt verbalized understanding and agreement with tx plan.     I personally performed the services described in this documentation, which was scribed in my presence. The recorded information has been reviewed and is accurate.    Noland Fordyce, PA-C 12/23/14 [place], MD 12/23/14 [phone]

## 2014-12-25 ENCOUNTER — Other Ambulatory Visit: Payer: Self-pay | Admitting: Lab

## 2014-12-25 ENCOUNTER — Ambulatory Visit: Payer: Self-pay

## 2014-12-25 ENCOUNTER — Ambulatory Visit: Payer: Self-pay | Admitting: Hematology & Oncology

## 2014-12-31 DIAGNOSIS — C801 Malignant (primary) neoplasm, unspecified: Secondary | ICD-10-CM

## 2014-12-31 HISTORY — DX: Malignant (primary) neoplasm, unspecified: C80.1

## 2014-12-31 HISTORY — PX: LARYNGOSCOPY: SUR817

## 2015-01-04 ENCOUNTER — Telehealth: Payer: Self-pay | Admitting: Hematology & Oncology

## 2015-01-04 ENCOUNTER — Telehealth: Payer: Self-pay | Admitting: *Deleted

## 2015-01-04 NOTE — Telephone Encounter (Signed)
Made appt and  spoke w NEW PATIENT today to remind them of their appointment with Dr. Marin Olp. Also, advised them to bring all medication bottles and insurance card information.   Per, Micheline Maze and [place] T

## 2015-01-04 NOTE — Telephone Encounter (Signed)
This patient is a new patient referral who had an appointment on January 8th due to being admitted at [facility]. She has since been released and wants to see Dr Marin Olp. His earliest appointment is 02/05/15. Patient is a new diagnosis head and neck cancer who needs to begin treatment. She currently has a treatment plan at [facility], but wants to be transferred here. I told her that Ennever was on vacation this week and that I could speak to him next week about working her into his schedule sooner. I advised the patient to follow the treatment plan of [facility] in the mean time so that staging work up and other important testing and treatments could begin while we were waiting. She agreed to this plan. Will call her back next week once I speak with Dr Marin Olp.

## 2015-01-11 ENCOUNTER — Encounter: Payer: Self-pay | Admitting: *Deleted

## 2015-01-13 ENCOUNTER — Ambulatory Visit (HOSPITAL_BASED_OUTPATIENT_CLINIC_OR_DEPARTMENT_OTHER): Payer: Self-pay | Admitting: Hematology & Oncology

## 2015-01-13 ENCOUNTER — Telehealth: Payer: Self-pay | Admitting: Hematology & Oncology

## 2015-01-13 ENCOUNTER — Ambulatory Visit: Payer: Self-pay

## 2015-01-13 ENCOUNTER — Encounter: Payer: Self-pay | Admitting: Hematology & Oncology

## 2015-01-13 VITALS — BP 103/59 | HR 84 | Temp 98.0°F | Resp 14 | Ht 66.0 in | Wt 121.0 lb

## 2015-01-13 DIAGNOSIS — R1310 Dysphagia, unspecified: Secondary | ICD-10-CM

## 2015-01-13 DIAGNOSIS — C329 Malignant neoplasm of larynx, unspecified: Secondary | ICD-10-CM

## 2015-01-13 DIAGNOSIS — Z720 Tobacco use: Secondary | ICD-10-CM

## 2015-01-13 DIAGNOSIS — R634 Abnormal weight loss: Secondary | ICD-10-CM

## 2015-01-13 NOTE — Telephone Encounter (Signed)
Pt aware of 1-27 appointment

## 2015-01-13 NOTE — Telephone Encounter (Signed)
MD wants to see pt today, no answer or voice mail on pt's phone. I left message's on both contact numbers to have pt call.

## 2015-01-14 ENCOUNTER — Telehealth: Payer: Self-pay | Admitting: Hematology & Oncology

## 2015-01-14 DIAGNOSIS — C329 Malignant neoplasm of larynx, unspecified: Secondary | ICD-10-CM | POA: Insufficient documentation

## 2015-01-14 NOTE — Telephone Encounter (Signed)
Faxed order, [facility] and demagraphics to [facility] scheduling

## 2015-01-14 NOTE — Telephone Encounter (Signed)
Janie at [facility] scheduling is aware to disregard PET order, Claiborne Billings was witness to conversation. Pt has decided to keep PET at [facility] on 2-4, she is aware to get disc. Pt also aware of 01-19-15 port at 930 am at [facility] to be npo after midnight and needs a driver to take her home, she is also aware of 2-8 tx. MD aware of the above

## 2015-01-14 NOTE — Telephone Encounter (Signed)
[name] at [facility] scheduling on speaker phone with [name] as a witness to inquire if they would do PET scan for pt with no insurance. She said she would fax a sheet over and to fill it out and fax it along with order. She said pt would be self pay and would have to set up payment plan when she checks in.

## 2015-01-14 NOTE — Telephone Encounter (Signed)
New pt here in ofc with NO INSURANCE. I gave her a [place] ([employer]) application to complete and return. Pt also advised, she has applied for MCD.

## 2015-01-18 ENCOUNTER — Other Ambulatory Visit: Payer: Self-pay | Admitting: Radiology

## 2015-01-18 NOTE — Progress Notes (Signed)
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 NEW PATIENT  : I have throat cancer. I live close by and want to have treatment locally  HPI: [name] is a very charming 49 year old white female. She is a past history of HIV and hepatitis C. Both appear to be inactive.  She presented with progressive pain and swelling in the right side of the neck. She initially went to the emergency room at [facility] on January 2.  She noted a lump on the right side of the neck back in May. She said that began to grow in December. She is having some right ear pain, dysphagia.  A CT scan showed a supraglottic mass and multiple necrotic bilateral lymph nodes. The largest lymph node was on the right side measuring 6.2 cm.  She subsequently was placed on antibiotics without any benefit.  She then underwent a biopsy. This is done on January 14. This showed a squamous cell carcinoma.  She has no evidence of metastatic disease.  She is scheduled for a PET scan in one week.  She was seen by oncology at a [facility]. They recommended concurrent weekly chemotherapy and radiation therapy.  Unfortunate, she just cannot make it out there. She has transportation issues. She only lives about 5 minutes from our office and light to be treated locally.  She is still having pain. She is on the Dilaudid. I think that a Duragesic patch would help. I will put her on 25 g patch.  She smokes. She actually quit back in December. She was drinking. There is a history of drug use.  She has lost some weight. Her appetite is down. She is having some dysphagia and odynophagia. She would prefer not to have a feeding tube placed.  Currently, her performance status is ECOG 1.                     Past Medical History  Diagnosis Date  . HIV (human immunodeficiency virus infection)   .  Diabetes mellitus without complication   :  Past Surgical History  Procedure Laterality Date  . Cesarean section    :   Current outpatient prescriptions:  .  acetaminophen-codeine 120-12 MG/5ML suspension, Take 5 mLs by mouth every 6 (six) hours as needed for pain. (Patient not taking: Reported on 01/15/2015), Disp: 60 mL, Rfl: 1 .  docusate sodium (COLACE) 100 MG capsule, Take 100 mg by mouth 2 (two) times daily., Disp: , Rfl:  .  HYDROcodone-acetaminophen (NORCO) 10-325 MG per tablet, Take 1 tablet by mouth every 6 (six) hours as needed for moderate pain. , Disp: , Rfl:  .  ibuprofen (ADVIL,MOTRIN) 200 MG tablet, Take 400-600 mg by mouth at bedtime. , Disp: , Rfl:  .  ondansetron (ZOFRAN) 4 MG tablet, Take 1 tablet (4 mg total) by mouth every 6 (six) hours., Disp: 12 tablet, Rfl: 0 .  oxyCODONE (OXY IR/ROXICODONE) 5 MG immediate release tablet, Take 5 mg by mouth every 6 (six) hours as needed for moderate pain. , Disp: , Rfl:  .  cephALEXin (KEFLEX) 500 MG capsule, Take 500 mg by mouth 2 (two) times daily., Disp: , Rfl: :  :  Allergies  Allergen Reactions  . Ciprofloxacin     hives  :  No family history on file.:  History   Social History  . Marital Status:  Widowed    Spouse Name: N/A    Number of Children: N/A  . Years of Education: N/A   Occupational History  . Not on file.   Social History Main Topics  . Smoking status: Former Smoker -- 0.50 packs/day for 32 years    Types: Cigarettes    Start date: 01/13/1986    Quit date: 11/26/2014  . Smokeless tobacco: Never Used     Comment: quit smoking December 2015  . Alcohol Use: No  . Drug Use: No  . Sexual Activity: Not on file   Other Topics Concern  . Not on file   Social History Narrative  :  Pertinent items are noted in HPI.  Exam: @IPVITALS @  fairly well-developed and well-nourished white female in no obvious distress. Vital signs are temperature of 98. Pulse 84. Blood pressure 103/59  Weight is 121  pounds. Head and neck exam shows no ocular or oral lesions. There are some palpable cervical or supraclavicular lymph nodes. Mostly, she has right-sided lymphadenopathy. Lungs are clear. Cardiac exam regular rate and rhythm with no murmurs, rubs or bruits. Abdomen is soft. She has good bowel sounds. There is no fluid wave. There is no palpable liver or spleen tip. Back exam shows no tenderness over the spine, ribs or hips. Extremities shows no clubbing, cyanosis or edema. Skin exam shows no rashes, ecchymoses or petechia. Neurological exam is nonfocal.   No results for input(s): WBC, HGB, HCT, PLT in the last 72 hours. No results for input(s): NA, K, CL, CO2, GLUCOSE, BUN, CREATININE, CALCIUM in the last 72 hours.  Blood smear review: None  Pathology: See pathology report from [facility] and Plan: Ms. 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  From my point of view, she is young and healthy. She is at significant risk for metastatic disease. As such, I would probably favor induction chemotherapy with DCF for 3 cycles. I believe she could tolerate this. She will need to have a Port-A-Cath placed.  I talked to her about this. She understands my recommendation and my reasoning. She wants to be aggressive and I think that this offers her the best chance at cure area did after 3 cycles, then I would go with concurrent low-dose weekly cis-platinum along with radiation.  Again, she has transportation issues. She liked the doctors at [facility] but just cannot get out there. We can certainly arrange her to be treated with radiation at [facility].  I spent about 1 hour with her. I went over the chemotherapy. I went over the side effects. She will need Neulasta. I explained the need for infusional chemotherapy. All this we can do in  her office.  She understands the risk of infection. She understands the risk of nausea and vomiting. She understands risk of bleeding. She understands risk of diarrhea.  I told her that she still may need to have a feeding tube put in. I told her to try to take some weight gain powder and make a milkshake this 2 or 3 times a day. I think this be a very good idea for her.  We will try to start with treatment in one week or so.  I will plan to get her back to be seen about a week or so after the first cycle of treatment.

## 2015-01-19 ENCOUNTER — Ambulatory Visit (HOSPITAL_COMMUNITY)
Admission: RE | Admit: 2015-01-19 | Discharge: 2015-01-19 | Disposition: A | Discharge status: Home / Self Care | Payer: Medicaid Other | Source: Ambulatory Visit | Attending: Hematology & Oncology | Admitting: Hematology & Oncology

## 2015-01-19 ENCOUNTER — Other Ambulatory Visit: Payer: Self-pay | Admitting: Hematology & Oncology

## 2015-01-19 ENCOUNTER — Encounter (HOSPITAL_COMMUNITY): Payer: Self-pay

## 2015-01-19 DIAGNOSIS — Z8619 Personal history of other infectious and parasitic diseases: Secondary | ICD-10-CM | POA: Insufficient documentation

## 2015-01-19 DIAGNOSIS — Z87891 Personal history of nicotine dependence: Secondary | ICD-10-CM | POA: Diagnosis not present

## 2015-01-19 DIAGNOSIS — Z452 Encounter for adjustment and management of vascular access device: Secondary | ICD-10-CM | POA: Insufficient documentation

## 2015-01-19 DIAGNOSIS — Z21 Asymptomatic human immunodeficiency virus [HIV] infection status: Secondary | ICD-10-CM | POA: Diagnosis not present

## 2015-01-19 DIAGNOSIS — C321 Malignant neoplasm of supraglottis: Secondary | ICD-10-CM | POA: Insufficient documentation

## 2015-01-19 DIAGNOSIS — C329 Malignant neoplasm of larynx, unspecified: Secondary | ICD-10-CM

## 2015-01-19 DIAGNOSIS — Z791 Long term (current) use of non-steroidal anti-inflammatories (NSAID): Secondary | ICD-10-CM | POA: Insufficient documentation

## 2015-01-19 DIAGNOSIS — Z79891 Long term (current) use of opiate analgesic: Secondary | ICD-10-CM | POA: Diagnosis not present

## 2015-01-19 DIAGNOSIS — Z79899 Other long term (current) drug therapy: Secondary | ICD-10-CM | POA: Insufficient documentation

## 2015-01-19 LAB — APTT: aPTT: 32 seconds (ref 24–37)

## 2015-01-19 LAB — CBC WITH DIFFERENTIAL/PLATELET
Basophils Absolute: 0 10*3/uL (ref 0.0–0.1)
Basophils Relative: 0 % (ref 0–1)
Eosinophils Absolute: 0.3 10*3/uL (ref 0.0–0.7)
Eosinophils Relative: 5 % (ref 0–5)
HEMATOCRIT: 30.3 % — AB (ref 36.0–46.0)
Hemoglobin: 9.3 g/dL — ABNORMAL LOW (ref 12.0–15.0)
Lymphocytes Relative: 26 % (ref 12–46)
Lymphs Abs: 1.5 10*3/uL (ref 0.7–4.0)
MCH: 22.6 pg — ABNORMAL LOW (ref 26.0–34.0)
MCHC: 30.7 g/dL (ref 30.0–36.0)
MCV: 73.7 fL — ABNORMAL LOW (ref 78.0–100.0)
MONOS PCT: 8 % (ref 3–12)
Monocytes Absolute: 0.5 10*3/uL (ref 0.1–1.0)
NEUTROS PCT: 61 % (ref 43–77)
Neutro Abs: 3.6 10*3/uL (ref 1.7–7.7)
Platelets: 324 10*3/uL (ref 150–400)
RBC: 4.11 MIL/uL (ref 3.87–5.11)
RDW: 24.2 % — ABNORMAL HIGH (ref 11.5–15.5)
WBC: 5.9 10*3/uL (ref 4.0–10.5)

## 2015-01-19 LAB — BASIC METABOLIC PANEL
ANION GAP: 7 (ref 5–15)
BUN: 10 mg/dL (ref 6–23)
CHLORIDE: 105 mmol/L (ref 96–112)
CO2: 25 mmol/L (ref 19–32)
Calcium: 9.3 mg/dL (ref 8.4–10.5)
Creatinine, Ser: 0.58 mg/dL (ref 0.50–1.10)
GFR calc Af Amer: >90 mL/min (ref >90)
GFR calc non Af Amer: >90 mL/min (ref >90)
Glucose, Bld: 94 mg/dL (ref 70–99)
Potassium: 3.8 mmol/L (ref 3.5–5.1)
SODIUM: 137 mmol/L (ref 135–145)

## 2015-01-19 LAB — PROTIME-INR
INR: 0.94 (ref 0.00–1.49)
Prothrombin Time: 12.7 seconds (ref 11.6–15.2)

## 2015-01-19 MED ORDER — CEFAZOLIN SODIUM-DEXTROSE 2-3 GM-% IV SOLR
2.0000 g | INTRAVENOUS | Status: AC
  Administered 2015-01-19: 2 g via INTRAVENOUS

## 2015-01-19 MED ORDER — LIDOCAINE-EPINEPHRINE 2 %-1:100000 IJ SOLN
INTRAMUSCULAR | Status: AC
  Filled 2015-01-19: qty 1

## 2015-01-19 MED ORDER — SODIUM CHLORIDE 0.9 % IV SOLN
INTRAVENOUS | Status: DC
  Administered 2015-01-19: 10:00:00 via INTRAVENOUS

## 2015-01-19 MED ORDER — FENTANYL CITRATE 0.05 MG/ML IJ SOLN
INTRAMUSCULAR | Status: AC | PRN
  Administered 2015-01-19: 50 ug via INTRAVENOUS
  Administered 2015-01-19 (×2): 25 ug via INTRAVENOUS
  Administered 2015-01-19: 50 ug via INTRAVENOUS

## 2015-01-19 MED ORDER — MIDAZOLAM HCL 2 MG/2ML IJ SOLN
INTRAMUSCULAR | Status: AC
Start: 2015-01-19 — End: 2015-01-19
  Filled 2015-01-19: qty 6

## 2015-01-19 MED ORDER — MIDAZOLAM HCL 2 MG/2ML IJ SOLN
INTRAMUSCULAR | Status: AC | PRN
  Administered 2015-01-19 (×4): 1 mg via INTRAVENOUS

## 2015-01-19 MED ORDER — HEPARIN SOD (PORK) LOCK FLUSH 100 UNIT/ML IV SOLN
INTRAVENOUS | Status: AC
  Filled 2015-01-19: qty 5

## 2015-01-19 MED ORDER — CEFAZOLIN SODIUM-DEXTROSE 2-3 GM-% IV SOLR
INTRAVENOUS | Status: AC
Start: 2015-01-19 — End: 2015-01-19
  Administered 2015-01-19: 2 g via INTRAVENOUS
  Filled 2015-01-19: qty 50

## 2015-01-19 MED ORDER — FENTANYL CITRATE 0.05 MG/ML IJ SOLN
INTRAMUSCULAR | Status: AC
  Filled 2015-01-19: qty 4

## 2015-01-19 NOTE — H&P (Signed)
Chief Complaint: "I'm here to get a port a cath"  Referring Physician(s): Ennever,Peter R  History of Present Illness: [name] is a 49 y.o. female with history of hepatitis C, HIV and  stage IV squamous cell carcinoma of right false vocal cord who presents today for port a cath placement for chemotherapy.   Past Medical History  Diagnosis Date  . HIV (human immunodeficiency virus infection)     Past Surgical History  Procedure Laterality Date  . Cesarean section      Allergies: Ciprofloxacin  Medications: Prior to Admission medications   Medication Sig Start Date End Date Taking? Authorizing Provider  acetaminophen-codeine 120-12 MG/5ML suspension Take 5 mLs by mouth every 6 (six) hours as needed for pain. 12/15/14  Yes Blanchie Dessert, MD  cephALEXin (KEFLEX) 500 MG capsule Take 500 mg by mouth 2 (two) times daily.   Yes Historical Provider, MD  docusate sodium (COLACE) 100 MG capsule Take 100 mg by mouth 2 (two) times daily.   Yes Historical Provider, MD  HYDROcodone-acetaminophen (NORCO) 10-325 MG per tablet Take 1 tablet by mouth every 6 (six) hours as needed for moderate pain.  01/11/15 02/10/15 Yes Historical Provider, MD  ibuprofen (ADVIL,MOTRIN) 200 MG tablet Take 400-600 mg by mouth at bedtime.    Yes Historical Provider, MD  ondansetron (ZOFRAN) 4 MG tablet Take 1 tablet (4 mg total) by mouth every 6 (six) hours. 08/17/14  Yes Domenic Moras, PA-C  oxyCODONE (OXY IR/ROXICODONE) 5 MG immediate release tablet Take 5 mg by mouth every 6 (six) hours as needed for moderate pain.  01/07/15  Yes Historical Provider, MD    History reviewed. No pertinent family history.  History   Social History  . Marital Status: Widowed    Spouse Name: N/A    Number of Children: N/A  . Years of Education: N/A   Social History Main Topics  . Smoking status: Former Smoker -- 0.50 packs/day for 32 years    Types: Cigarettes    Start date: 01/13/1986    Quit date: 11/26/2014  .  Smokeless tobacco: Never Used     Comment: quit smoking December 2015  . Alcohol Use: No  . Drug Use: No  . Sexual Activity: None   Other Topics Concern  . None   Social History Narrative      Review of Systems  Constitutional: Negative for fever and chills.  HENT:       Mild odynophagia  Respiratory: Negative for cough and shortness of breath.   Cardiovascular: Negative for chest pain.  Gastrointestinal: Positive for abdominal pain. Negative for nausea, vomiting and blood in stool.  Genitourinary: Negative for dysuria and hematuria.  Musculoskeletal: Negative for back pain.  Neurological:       Occ HA's    Vital Signs: BP 119/64 mmHg  Pulse 84  Temp(Src) 98.2 F (36.8 C) (Oral)  Resp 16  SpO2 97%  LMP 01/12/2015  Physical Exam  Constitutional: She is oriented to person, place, and time.  Thin WF in NAD  Cardiovascular: Normal rate and regular rhythm.   Pulmonary/Chest: Effort normal and breath sounds normal.  Abdominal: Soft. Bowel sounds are normal. There is tenderness.  Musculoskeletal: Normal range of motion. She exhibits no edema.  Lymphadenopathy:    She has cervical adenopathy.  Neurological: She is alert and oriented to person, place, and time.    Imaging: No results found.  Labs:  CBC:  Recent Labs  08/17/14 2118 11/11/14 0231 12/15/14 1740  12/15/14 0224 01/19/15 0955  WBC 5.4 5.1 11.5*  --  5.9  HGB 9.3* 8.0* 8.8* 10.9* 9.3*  HCT 30.0* 26.6* 29.1* 32.0* 30.3*  PLT 360 379 417*  --  324    COAGS:  Recent Labs  01/19/15 0955  INR 0.94  APTT 32    BMP:  Recent Labs  08/17/14 2118 11/11/14 0231 12/15/14 0213 12/15/14 0224 01/19/15 0955  NA 139 137 130* 135 137  K 3.6* 3.9 3.7 3.7 3.8  CL 102 101 99 101 105  CO2 23 25 23   --  25  GLUCOSE 90 84 106* 108* 94  BUN 9 10 5* 5* 10  CALCIUM 9.4 9.0 8.9  --  9.3  CREATININE 0.62 0.54 0.58 0.60 0.58  GFRNONAA >90 >90 >90  --  >90  GFRAA >90 >90 >90  --  >90    LIVER  FUNCTION TESTS:  Recent Labs  08/17/14 2118 12/15/14 0213  BILITOT <0.2* 0.2*  AST 16 22  ALT 10 14  ALKPHOS 69 106  PROT 8.0 7.8  ALBUMIN 3.8 3.5    TUMOR MARKERS: No results for input(s): AFPTM, CEA, CA199, CHROMGRNA in the last 8760 hours.  Assessment and Plan: [name] is a 49 y.o. female with history of hepatitis C, HIV and  stage IV squamous cell carcinoma of right false vocal cord who presents today for port a cath placement for chemotherapy. Details/risks of procedure d/w pt with her understanding and consent.    Signed: Autumn Messing 01/19/2015, 11:02 AM

## 2015-01-19 NOTE — Procedures (Signed)
Successful placement of right IJ approach port-a-cath with tip at the superior caval atrial junction. The catheter is ready for immediate use. No immediate post procedural complications. 

## 2015-01-19 NOTE — Discharge Instructions (Signed)
Leave dressing on for 24 hours.  You may shower after 24 hours.  Please remove the dressing before you shower.   ° °Conscious Sedation, Adult, Care After °Refer to this sheet in the next few weeks. These instructions provide you with information on caring for yourself after your procedure. Your health care provider may also give you more specific instructions. Your treatment has been planned according to current medical practices, but problems sometimes occur. Call your health care provider if you have any problems or questions after your procedure. °WHAT TO EXPECT AFTER THE PROCEDURE  °After your procedure: °· You may feel sleepy, clumsy, and have poor balance for several hours. °· Vomiting may occur if you eat too soon after the procedure. °HOME CARE INSTRUCTIONS °· Do not participate in any activities where you could become injured for at least 24 hours. Do not: °¨ Drive. °¨ Swim. °¨ Ride a bicycle. °¨ Operate heavy machinery. °¨ Cook. °¨ Use power tools. °¨ Climb ladders. °¨ Work from a high place. °· Do not make important decisions or sign legal documents until you are improved. °· If you vomit, drink water, juice, or soup when you can drink without vomiting. Make sure you have little or no nausea before eating solid foods. °· Only take over-the-counter or prescription medicines for pain, discomfort, or fever as directed by your health care provider. °· Make sure you and your family fully understand everything about the medicines given to you, including what side effects may occur. °· You should not drink alcohol, take sleeping pills, or take medicines that cause drowsiness for at least 24 hours. °· If you smoke, do not smoke without supervision. °· If you are feeling better, you may resume normal activities 24 hours after you were sedated. °· Keep all appointments with your health care provider. °SEEK MEDICAL CARE IF: °· Your skin is pale or bluish in color. °· You continue to feel nauseous or vomit. °· Your  pain is getting worse and is not helped by medicine. °· You have bleeding or swelling. °· You are still sleepy or feeling clumsy after 24 hours. °SEEK IMMEDIATE MEDICAL CARE IF: °· You develop a rash. °· You have difficulty breathing. °· You develop any type of allergic problem. °· You have a fever. °MAKE SURE YOU: °· Understand these instructions. °· Will watch your condition. °· Will get help right away if you are not doing well or get worse. °Document Released: 09/24/2013 Document Reviewed: 09/24/2013 °ExitCare® Patient Information ©2015 [employer]. This information is not intended to replace advice given to you by your health care provider. Make sure you discuss any questions you have with your health care provider. ° °Implanted Port Insertion, Care After °Refer to this sheet in the next few weeks. These instructions provide you with information on caring for yourself after your procedure. Your health care provider may also give you more specific instructions. Your treatment has been planned according to current medical practices, but problems sometimes occur. Call your health care provider if you have any problems or questions after your procedure. °WHAT TO EXPECT AFTER THE PROCEDURE °After your procedure, it is typical to have the following:  °· Discomfort at the port insertion site. Ice packs to the area will help. °· Bruising on the skin over the port. This will subside in 3-4 days. °HOME CARE INSTRUCTIONS °· After your port is placed, you will get a manufacturer's information card. The card has information about your port. Keep this card with you   at all times.   °· Know what kind of port you have. There are many types of ports available.   °· Wear a medical alert bracelet in case of an emergency. This can help alert health care workers that you have a port.   °· The port can stay in for as long as your health care provider believes it is necessary.   °· A home health care nurse may give medicines and  take care of the port.   °· You or a family member can get special training and directions for giving medicine and taking care of the port at home.   °SEEK MEDICAL CARE IF:  °· Your port does not flush or you are unable to get a blood return.   °· You have a fever or chills. °SEEK IMMEDIATE MEDICAL CARE IF: °· You have new fluid or pus coming from your incision.   °· You notice a bad smell coming from your incision site.   °· You have swelling, pain, or more redness at the incision or port site.   °· You have chest pain or shortness of breath. °Document Released: 09/24/2013 Document Revised: 12/09/2013 Document Reviewed: 09/24/2013 °ExitCare® Patient Information ©2015 [employer]. This information is not intended to replace advice given to you by your health care provider. Make sure you discuss any questions you have with your health care provider. °Implanted Port Home Guide °An implanted port is a type of central line that is placed under the skin. Central lines are used to provide IV access when treatment or nutrition needs to be given through a person's veins. Implanted ports are used for long-term IV access. An implanted port may be placed because:  °· You need IV medicine that would be irritating to the small veins in your hands or arms.   °· You need long-term IV medicines, such as antibiotics.   °· You need IV nutrition for a long period.   °· You need frequent blood draws for lab tests.   °· You need dialysis.   °Implanted ports are usually placed in the chest area, but they can also be placed in the upper arm, the abdomen, or the leg. An implanted port has two main parts:  °· Reservoir. The reservoir is round and will appear as a small, raised area under your skin. The reservoir is the part where a needle is inserted to give medicines or draw blood.   °· Catheter. The catheter is a thin, flexible tube that extends from the reservoir. The catheter is placed into a large vein. Medicine that is inserted into  the reservoir goes into the catheter and then into the vein.   °HOW WILL I CARE FOR MY INCISION SITE? °Do not get the incision site wet. Bathe or shower as directed by your health care provider.  °HOW IS MY PORT ACCESSED? °Special steps must be taken to access the port:  °· Before the port is accessed, a numbing cream can be placed on the skin. This helps numb the skin over the port site.   °· Your health care provider uses a sterile technique to access the port. °· Your health care provider must put on a mask and sterile gloves. °· The skin over your port is cleaned carefully with an antiseptic and allowed to dry. °· The port is gently pinched between sterile gloves, and a needle is inserted into the port. °· Only "non-coring" port needles should be used to access the port. Once the port is accessed, a blood return should be checked. This helps ensure that the port is in   the vein and is not clogged.   °· If your port needs to remain accessed for a constant infusion, a clear (transparent) bandage will be placed over the needle site. The bandage and needle will need to be changed every week, or as directed by your health care provider.   °· Keep the bandage covering the needle clean and dry. Do not get it wet. Follow your health care provider's instructions on how to take a shower or bath while the port is accessed.   °· If your port does not need to stay accessed, no bandage is needed over the port.   °WHAT IS FLUSHING? °Flushing helps keep the port from getting clogged. Follow your health care provider's instructions on how and when to flush the port. Ports are usually flushed with saline solution or a medicine called heparin. The need for flushing will depend on how the port is used.  °· If the port is used for intermittent medicines or blood draws, the port will need to be flushed:   °· After medicines have been given.   °· After blood has been drawn.   °· As part of routine maintenance.   °· If a constant  infusion is running, the port may not need to be flushed.   °HOW LONG WILL MY PORT STAY IMPLANTED? °The port can stay in for as long as your health care provider thinks it is needed. When it is time for the port to come out, surgery will be done to remove it. The procedure is similar to the one performed when the port was put in.  °WHEN SHOULD I SEEK IMMEDIATE MEDICAL CARE? °When you have an implanted port, you should seek immediate medical care if:  °· You notice a bad smell coming from the incision site.   °· You have swelling, redness, or drainage at the incision site.   °· You have more swelling or pain at the port site or the surrounding area.   °· You have a fever that is not controlled with medicine. °Document Released: 12/04/2005 Document Revised: 09/24/2013 Document Reviewed: 08/11/2013 °ExitCare® Patient Information ©2015 [employer]. This information is not intended to replace advice given to you by your health care provider. Make sure you discuss any questions you have with your health care provider. ° °

## 2015-01-20 ENCOUNTER — Ambulatory Visit (HOSPITAL_COMMUNITY)
Admission: RE | Admit: 2015-01-20 | Discharge: 2015-01-20 | Disposition: A | Discharge status: Home / Self Care | Payer: No Typology Code available for payment source | Source: Ambulatory Visit | Attending: Interventional Radiology | Admitting: Interventional Radiology

## 2015-01-20 ENCOUNTER — Other Ambulatory Visit (HOSPITAL_COMMUNITY): Payer: Self-pay | Admitting: Interventional Radiology

## 2015-01-20 DIAGNOSIS — IMO0002 Reserved for concepts with insufficient information to code with codable children: Secondary | ICD-10-CM

## 2015-01-22 ENCOUNTER — Encounter: Payer: Self-pay | Admitting: *Deleted

## 2015-01-22 DIAGNOSIS — C329 Malignant neoplasm of larynx, unspecified: Secondary | ICD-10-CM

## 2015-01-22 MED ORDER — PROCHLORPERAZINE MALEATE 10 MG PO TABS: 10.0000 mg | ORAL_TABLET | Freq: Four times a day (QID) | ORAL | Status: DC | PRN

## 2015-01-22 MED ORDER — LIDOCAINE-PRILOCAINE 2.5-2.5 % EX CREA: TOPICAL_CREAM | CUTANEOUS | Status: DC

## 2015-01-22 MED ORDER — LORAZEPAM 0.5 MG PO TABS: 0.5000 mg | ORAL_TABLET | Freq: Four times a day (QID) | ORAL | Status: DC | PRN

## 2015-01-22 MED ORDER — DEXAMETHASONE 4 MG PO TABS: 8.0000 mg | ORAL_TABLET | Freq: Two times a day (BID) | ORAL | Status: DC

## 2015-01-22 MED ORDER — ONDANSETRON HCL 8 MG PO TABS: 8.0000 mg | ORAL_TABLET | Freq: Two times a day (BID) | ORAL | Status: DC | PRN

## 2015-01-25 ENCOUNTER — Ambulatory Visit (HOSPITAL_BASED_OUTPATIENT_CLINIC_OR_DEPARTMENT_OTHER): Payer: Medicaid Other

## 2015-01-25 ENCOUNTER — Other Ambulatory Visit: Payer: Self-pay

## 2015-01-25 DIAGNOSIS — Z5111 Encounter for antineoplastic chemotherapy: Secondary | ICD-10-CM

## 2015-01-25 DIAGNOSIS — C329 Malignant neoplasm of larynx, unspecified: Secondary | ICD-10-CM

## 2015-01-25 MED ORDER — ENSURE COMPLETE SHAKE PO LIQD: 1.0000 | Freq: Two times a day (BID) | ORAL | Status: DC

## 2015-01-25 MED ORDER — DOCETAXEL CHEMO INJECTION 160 MG/16ML
60.0000 mg/m2 | Freq: Once | INTRAVENOUS | Status: AC
  Administered 2015-01-25: 100 mg via INTRAVENOUS
  Filled 2015-01-25: qty 8

## 2015-01-25 MED ORDER — SODIUM CHLORIDE 0.9 % IJ SOLN
10.0000 mL | INTRAMUSCULAR | Status: DC | PRN
  Filled 2015-01-25: qty 10

## 2015-01-25 MED ORDER — SODIUM CHLORIDE 0.9 % IV SOLN
67.5000 mg/m2 | Freq: Once | INTRAVENOUS | Status: AC
  Administered 2015-01-25: 108 mg via INTRAVENOUS
  Filled 2015-01-25: qty 108

## 2015-01-25 MED ORDER — PALONOSETRON HCL INJECTION 0.25 MG/5ML
0.2500 mg | Freq: Once | INTRAVENOUS | Status: AC
  Administered 2015-01-25: 0.25 mg via INTRAVENOUS

## 2015-01-25 MED ORDER — SODIUM CHLORIDE 0.9 % IV SOLN
150.0000 mg | Freq: Once | INTRAVENOUS | Status: AC
  Administered 2015-01-25: 150 mg via INTRAVENOUS
  Filled 2015-01-25: qty 5

## 2015-01-25 MED ORDER — HEPARIN SOD (PORK) LOCK FLUSH 100 UNIT/ML IV SOLN
500.0000 [IU] | Freq: Once | INTRAVENOUS | Status: DC | PRN
  Filled 2015-01-25: qty 5

## 2015-01-25 MED ORDER — PALONOSETRON HCL INJECTION 0.25 MG/5ML
INTRAVENOUS | Status: AC
  Filled 2015-01-25: qty 5

## 2015-01-25 MED ORDER — DEXAMETHASONE SODIUM PHOSPHATE 20 MG/5ML IJ SOLN
12.0000 mg | Freq: Once | INTRAMUSCULAR | Status: AC
  Administered 2015-01-25: 12 mg via INTRAVENOUS

## 2015-01-25 MED ORDER — DEXAMETHASONE SODIUM PHOSPHATE 20 MG/5ML IJ SOLN
INTRAMUSCULAR | Status: AC
  Filled 2015-01-25: qty 5

## 2015-01-25 MED ORDER — SODIUM CHLORIDE 0.9 % IV SOLN
Freq: Once | INTRAVENOUS | Status: AC
  Administered 2015-01-25: 10:00:00 via INTRAVENOUS

## 2015-01-25 MED ORDER — POTASSIUM CHLORIDE 2 MEQ/ML IV SOLN
Freq: Once | INTRAVENOUS | Status: AC
  Administered 2015-01-25: 10:00:00 via INTRAVENOUS
  Filled 2015-01-25: qty 10

## 2015-01-25 MED ORDER — SODIUM CHLORIDE 0.9 % IV SOLN
3800.0000 mg | INTRAVENOUS | Status: DC
  Administered 2015-01-25: 3800 mg via INTRAVENOUS
  Filled 2015-01-25: qty 76

## 2015-01-25 NOTE — Patient Instructions (Addendum)
[facility] Discharge Instructions for Patients Receiving Chemotherapy  Today you received the following chemotherapy agents Taxotere, 5FU, Cisplatin  To help prevent nausea and vomiting after your treatment, we encourage you to take your nausea medication   1) Ondansetron (Zofran) Take 1 tablet  (8 mg) by mouth twice daily once in the am, once in the pm.  Begin this on Thursday 01/28/2015 and take for 3 days.    2) Ativan Take 1 tablet (.5mg ) by mouth every 6 hours as needed for Nausea and Vomiting.  This will make you sleepy. 3) Decadron- Take 2 tablets two times daily starting the day before chemo.  (this time we gave it through your IV)..  Take the day after chemo and for 2 days after chemo. 4) Compazine 10 mg Take one tablet by  Mouth every 6 hours as needed for nausea and vomiting.   Discharge Instructions for Patients Receiving Chemotherapy    To help prevent nausea and vomiting after your treatment, we encourage you to take your nausea medication as prescribed    If you develop nausea and vomiting that is not controlled by your nausea medication, call the clinic. If it is after clinic hours your family physician or the after hours number for the clinic or go to the Emergency Department.   BELOW ARE SYMPTOMS THAT SHOULD BE REPORTED IMMEDIATELY:  *FEVER GREATER THAN 100.5 F  *CHILLS WITH OR WITHOUT FEVER  NAUSEA AND VOMITING THAT IS NOT CONTROLLED WITH YOUR NAUSEA MEDICATION  *UNUSUAL SHORTNESS OF BREATH  *UNUSUAL BRUISING OR BLEEDING  TENDERNESS IN MOUTH AND THROAT WITH OR WITHOUT PRESENCE OF ULCERS  *URINARY PROBLEMS  *BOWEL PROBLEMS  UNUSUAL RASH Items with * indicate a potential emergency and should be followed up as soon as possible.  One of the nurses will contact you 24 hours after your treatment. Please let the nurse know about any problems that you may have experienced. Feel free to call the clinic you have any questions or concerns. The  clinic phone number is [phone].   I have been informed and understand all the instructions given to me. I know to contact the clinic, my physician, or go to the Emergency Department if any problems should occur. I do not have any questions at this time, but understand that I may call the clinic during office hours   should I have any questions or need assistance in obtaining follow up care.    __________________________________________  _____________  __________ Signature of Patient or Authorized Representative            Date                   Time    __________________________________________ Nurse's Signature   Fluorouracil, 5-FU injection What is this medicine? FLUOROURACIL, 5-FU (flure oh YOOR a sil) is a chemotherapy drug. It slows the growth of cancer cells. This medicine is used to treat many types of cancer like breast cancer, colon or rectal cancer, pancreatic cancer, and stomach cancer. This medicine may be used for other purposes; ask your health care provider or pharmacist if you have questions. COMMON BRAND NAME(S): Adrucil What should I tell my health care provider before I take this medicine? They need to know if you have any of these conditions: -blood disorders -dihydropyrimidine dehydrogenase (DPD) deficiency -infection (especially a virus infection such as chickenpox, cold sores, or herpes) -kidney disease -liver disease -malnourished, poor nutrition -recent or ongoing radiation therapy -  an unusual or allergic reaction to fluorouracil, other chemotherapy, other medicines, foods, dyes, or preservatives -pregnant or trying to get pregnant -breast-feeding How should I use this medicine? This drug is given as an infusion or injection into a vein. It is administered in a hospital or clinic by a specially trained health care professional. Talk to your pediatrician regarding the use of this medicine in children. Special care may be needed. Overdosage: If you  think you have taken too much of this medicine contact a poison control center or emergency room at once. NOTE: This medicine is only for you. Do not share this medicine with others. What if I miss a dose? It is important not to miss your dose. Call your doctor or health care professional if you are unable to keep an appointment. What may interact with this medicine? -allopurinol -cimetidine -dapsone -digoxin -hydroxyurea -leucovorin -levamisole -medicines for seizures like ethotoin, fosphenytoin, phenytoin -medicines to increase blood counts like filgrastim, pegfilgrastim, sargramostim -medicines that treat or prevent blood clots like warfarin, enoxaparin, and dalteparin -methotrexate -metronidazole -pyrimethamine -some other chemotherapy drugs like busulfan, cisplatin, estramustine, vinblastine -trimethoprim -trimetrexate -vaccines Talk to your doctor or health care professional before taking any of these medicines: -acetaminophen -aspirin -ibuprofen -ketoprofen -naproxen This list may not describe all possible interactions. Give your health care provider a list of all the medicines, herbs, non-prescription drugs, or dietary supplements you use. Also tell them if you smoke, drink alcohol, or use illegal drugs. Some items may interact with your medicine. What should I watch for while using this medicine? Visit your doctor for checks on your progress. This drug may make you feel generally unwell. This is not uncommon, as chemotherapy can affect healthy cells as well as cancer cells. Report any side effects. Continue your course of treatment even though you feel ill unless your doctor tells you to stop. In some cases, you may be given additional medicines to help with side effects. Follow all directions for their use. Call your doctor or health care professional for advice if you get a fever, chills or sore throat, or other symptoms of a cold or flu. Do not treat yourself. This drug  decreases your body's ability to fight infections. Try to avoid being around people who are sick. This medicine may increase your risk to bruise or bleed. Call your doctor or health care professional if you notice any unusual bleeding. Be careful brushing and flossing your teeth or using a toothpick because you may get an infection or bleed more easily. If you have any dental work done, tell your dentist you are receiving this medicine. Avoid taking products that contain aspirin, acetaminophen, ibuprofen, naproxen, or ketoprofen unless instructed by your doctor. These medicines may hide a fever. Do not become pregnant while taking this medicine. Women should inform their doctor if they wish to become pregnant or think they might be pregnant. There is a potential for serious side effects to an unborn child. Talk to your health care professional or pharmacist for more information. Do not breast-feed an infant while taking this medicine. Men should inform their doctor if they wish to father a child. This medicine may lower sperm counts. Do not treat diarrhea with over the counter products. Contact your doctor if you have diarrhea that lasts more than 2 days or if it is severe and watery. This medicine can make you more sensitive to the sun. Keep out of the sun. If you cannot avoid being in the sun, wear protective clothing  and use sunscreen. Do not use sun lamps or tanning beds/booths. What side effects may I notice from receiving this medicine? Side effects that you should report to your doctor or health care professional as soon as possible: -allergic reactions like skin rash, itching or hives, swelling of the face, lips, or tongue -low blood counts - this medicine may decrease the number of white blood cells, red blood cells and platelets. You may be at increased risk for infections and bleeding. -signs of infection - fever or chills, cough, sore throat, pain or difficulty passing urine -signs of  decreased platelets or bleeding - bruising, pinpoint red spots on the skin, black, tarry stools, blood in the urine -signs of decreased red blood cells - unusually weak or tired, fainting spells, lightheadedness -breathing problems -changes in vision -chest pain -mouth sores -nausea and vomiting -pain, swelling, redness at site where injected -pain, tingling, numbness in the hands or feet -redness, swelling, or sores on hands or feet -stomach pain -unusual bleeding Side effects that usually do not require medical attention (report to your doctor or health care professional if they continue or are bothersome): -changes in finger or toe nails -diarrhea -dry or itchy skin -hair loss -headache -loss of appetite -sensitivity of eyes to the light -stomach upset -unusually teary eyes This list may not describe all possible side effects. Call your doctor for medical advice about side effects. You may report side effects to FDA at 1-800-FDA-1088. Where should I keep my medicine? This drug is given in a hospital or clinic and will not be stored at home. NOTE: This sheet is a summary. It may not cover all possible information. If you have questions about this medicine, talk to your doctor, pharmacist, or health care provider.  2015, [employer] Standard. (2008-04-08 13:53:16) Cisplatin injection What is this medicine? CISPLATIN (SIS pla tin) is a chemotherapy drug. It targets fast dividing cells, like cancer cells, and causes these cells to die. This medicine is used to treat many types of cancer like bladder, ovarian, and testicular cancers. This medicine may be used for other purposes; ask your health care provider or pharmacist if you have questions. COMMON BRAND NAME(S): Platinol, Platinol -AQ What should I tell my health care provider before I take this medicine? They need to know if you have any of these conditions: -blood disorders -hearing problems -kidney disease -recent or  ongoing radiation therapy -an unusual or allergic reaction to cisplatin, carboplatin, other chemotherapy, other medicines, foods, dyes, or preservatives -pregnant or trying to get pregnant -breast-feeding How should I use this medicine? This drug is given as an infusion into a vein. It is administered in a hospital or clinic by a specially trained health care professional. Talk to your pediatrician regarding the use of this medicine in children. Special care may be needed. Overdosage: If you think you have taken too much of this medicine contact a poison control center or emergency room at once. NOTE: This medicine is only for you. Do not share this medicine with others. What if I miss a dose? It is important not to miss a dose. Call your doctor or health care professional if you are unable to keep an appointment. What may interact with this medicine? -dofetilide -foscarnet -medicines for seizures -medicines to increase blood counts like filgrastim, pegfilgrastim, sargramostim -probenecid -pyridoxine used with altretamine -rituximab -some antibiotics like amikacin, gentamicin, neomycin, polymyxin B, streptomycin, tobramycin -sulfinpyrazone -vaccines -zalcitabine Talk to your doctor or health care professional before taking any of these  medicines: -acetaminophen -aspirin -ibuprofen -ketoprofen -naproxen This list may not describe all possible interactions. Give your health care provider a list of all the medicines, herbs, non-prescription drugs, or dietary supplements you use. Also tell them if you smoke, drink alcohol, or use illegal drugs. Some items may interact with your medicine. What should I watch for while using this medicine? Your condition will be monitored carefully while you are receiving this medicine. You will need important blood work done while you are taking this medicine. This drug may make you feel generally unwell. This is not uncommon, as chemotherapy can affect  healthy cells as well as cancer cells. Report any side effects. Continue your course of treatment even though you feel ill unless your doctor tells you to stop. In some cases, you may be given additional medicines to help with side effects. Follow all directions for their use. Call your doctor or health care professional for advice if you get a fever, chills or sore throat, or other symptoms of a cold or flu. Do not treat yourself. This drug decreases your body's ability to fight infections. Try to avoid being around people who are sick. This medicine may increase your risk to bruise or bleed. Call your doctor or health care professional if you notice any unusual bleeding. Be careful brushing and flossing your teeth or using a toothpick because you may get an infection or bleed more easily. If you have any dental work done, tell your dentist you are receiving this medicine. Avoid taking products that contain aspirin, acetaminophen, ibuprofen, naproxen, or ketoprofen unless instructed by your doctor. These medicines may hide a fever. Do not become pregnant while taking this medicine. Women should inform their doctor if they wish to become pregnant or think they might be pregnant. There is a potential for serious side effects to an unborn child. Talk to your health care professional or pharmacist for more information. Do not breast-feed an infant while taking this medicine. Drink fluids as directed while you are taking this medicine. This will help protect your kidneys. Call your doctor or health care professional if you get diarrhea. Do not treat yourself. What side effects may I notice from receiving this medicine? Side effects that you should report to your doctor or health care professional as soon as possible: -allergic reactions like skin rash, itching or hives, swelling of the face, lips, or tongue -signs of infection - fever or chills, cough, sore throat, pain or difficulty passing urine -signs of  decreased platelets or bleeding - bruising, pinpoint red spots on the skin, black, tarry stools, nosebleeds -signs of decreased red blood cells - unusually weak or tired, fainting spells, lightheadedness -breathing problems -changes in hearing -gout pain -low blood counts - This drug may decrease the number of white blood cells, red blood cells and platelets. You may be at increased risk for infections and bleeding. -nausea and vomiting -pain, swelling, redness or irritation at the injection site -pain, tingling, numbness in the hands or feet -problems with balance, movement -trouble passing urine or change in the amount of urine Side effects that usually do not require medical attention (report to your doctor or health care professional if they continue or are bothersome): -changes in vision -loss of appetite -metallic taste in the mouth or changes in taste This list may not describe all possible side effects. Call your doctor for medical advice about side effects. You may report side effects to FDA at 1-800-FDA-1088. Where should I keep my medicine? This  drug is given in a hospital or clinic and will not be stored at home. NOTE: This sheet is a summary. It may not cover all possible information. If you have questions about this medicine, talk to your doctor, pharmacist, or health care provider.  2015, [employer] Standard. (2008-03-10 14:40:54) Docetaxel injection What is this medicine? DOCETAXEL (doe se TAX el) is a chemotherapy drug. It targets fast dividing cells, like cancer cells, and causes these cells to die. This medicine is used to treat many types of cancers like breast cancer, certain stomach cancers, head and neck cancer, lung cancer, and prostate cancer. This medicine may be used for other purposes; ask your health care provider or pharmacist if you have questions. COMMON BRAND NAME(S): Docefrez, Taxotere What should I tell my health care provider before I take this  medicine? They need to know if you have any of these conditions: -infection (especially a virus infection such as chickenpox, cold sores, or herpes) -liver disease -low blood counts, like low white cell, platelet, or red cell counts -an unusual or allergic reaction to docetaxel, polysorbate 80, other chemotherapy agents, other medicines, foods, dyes, or preservatives -pregnant or trying to get pregnant -breast-feeding How should I use this medicine? This drug is given as an infusion into a vein. It is administered in a hospital or clinic by a specially trained health care professional. Talk to your pediatrician regarding the use of this medicine in children. Special care may be needed. Overdosage: If you think you have taken too much of this medicine contact a poison control center or emergency room at once. NOTE: This medicine is only for you. Do not share this medicine with others. What if I miss a dose? It is important not to miss your dose. Call your doctor or health care professional if you are unable to keep an appointment. What may interact with this medicine? -cyclosporine -erythromycin -ketoconazole -medicines to increase blood counts like filgrastim, pegfilgrastim, sargramostim -vaccines Talk to your doctor or health care professional before taking any of these medicines: -acetaminophen -aspirin -ibuprofen -ketoprofen -naproxen This list may not describe all possible interactions. Give your health care provider a list of all the medicines, herbs, non-prescription drugs, or dietary supplements you use. Also tell them if you smoke, drink alcohol, or use illegal drugs. Some items may interact with your medicine. What should I watch for while using this medicine? Your condition will be monitored carefully while you are receiving this medicine. You will need important blood work done while you are taking this medicine. This drug may make you feel generally unwell. This is not  uncommon, as chemotherapy can affect healthy cells as well as cancer cells. Report any side effects. Continue your course of treatment even though you feel ill unless your doctor tells you to stop. In some cases, you may be given additional medicines to help with side effects. Follow all directions for their use. Call your doctor or health care professional for advice if you get a fever, chills or sore throat, or other symptoms of a cold or flu. Do not treat yourself. This drug decreases your body's ability to fight infections. Try to avoid being around people who are sick. This medicine may increase your risk to bruise or bleed. Call your doctor or health care professional if you notice any unusual bleeding. Be careful brushing and flossing your teeth or using a toothpick because you may get an infection or bleed more easily. If you have any dental work done, tell  your dentist you are receiving this medicine. Avoid taking products that contain aspirin, acetaminophen, ibuprofen, naproxen, or ketoprofen unless instructed by your doctor. These medicines may hide a fever. This medicine contains an alcohol in the product. You may get drowsy or dizzy. Do not drive, use machinery, or do anything that needs mental alertness until you know how this medicine affects you. Do not stand or sit up quickly, especially if you are an older patient. This reduces the risk of dizzy or fainting spells. Avoid alcoholic drinks Do not become pregnant while taking this medicine. Women should inform their doctor if they wish to become pregnant or think they might be pregnant. There is a potential for serious side effects to an unborn child. Talk to your health care professional or pharmacist for more information. Do not breast-feed an infant while taking this medicine. What side effects may I notice from receiving this medicine? Side effects that you should report to your doctor or health care professional as soon as  possible: -allergic reactions like skin rash, itching or hives, swelling of the face, lips, or tongue -low blood counts - This drug may decrease the number of white blood cells, red blood cells and platelets. You may be at increased risk for infections and bleeding. -signs of infection - fever or chills, cough, sore throat, pain or difficulty passing urine -signs of decreased platelets or bleeding - bruising, pinpoint red spots on the skin, black, tarry stools, nosebleeds -signs of decreased red blood cells - unusually weak or tired, fainting spells, lightheadedness -breathing problems -fast or irregular heartbeat -low blood pressure -mouth sores -nausea and vomiting -pain, swelling, redness or irritation at the injection site -pain, tingling, numbness in the hands or feet -swelling of the ankle, feet, hands -weight gain Side effects that usually do not require medical attention (report to your prescriber or health care professional if they continue or are bothersome): -bone pain -complete hair loss including hair on your head, underarms, pubic hair, eyebrows, and eyelashes -diarrhea -excessive tearing -changes in the color of fingernails -loosening of the fingernails -nausea -muscle pain -red flush to skin -sweating -weak or tired This list may not describe all possible side effects. Call your doctor for medical advice about side effects. You may report side effects to FDA at 1-800-FDA-1088. Where should I keep my medicine? This drug is given in a hospital or clinic and will not be stored at home. NOTE: This sheet is a summary. It may not cover all possible information. If you have questions about this medicine, talk to your doctor, pharmacist, or health care provider.  2015, [employer] Standard. (2013-10-30 22:21:02)

## 2015-01-26 ENCOUNTER — Encounter: Payer: Self-pay | Admitting: Hematology & Oncology

## 2015-01-27 ENCOUNTER — Telehealth: Payer: Self-pay

## 2015-01-27 ENCOUNTER — Telehealth: Payer: Self-pay | Admitting: Hematology & Oncology

## 2015-01-27 NOTE — Telephone Encounter (Signed)
Received call from pt reporting fatigue and requesting 5FU pump be d/c tomorrow instead of Friday. Denies nausea or vomiting, constipation or diarrhea. States "I just feel so tired." Educated pt on side effects of chemo and that unfortunately her lethargy is from the cisplatin she received earlier in the week vs 5FU. Patient continues to request pump d/c or "at least Dr Marin Olp should see me."  Per Dr Marin Olp, pt scheduled for IVF tomorrow & continue 5FU.  Attempted to contact pt at given # - [phone]. No answer & no opportunity to leave a message until the office closed. Note for desk RN to contact pt in the am.

## 2015-01-27 NOTE — Telephone Encounter (Signed)
Faxed medical records today to: [facility]    F: [phone] P: [place]   Case: [id number]     COPY SCANNED

## 2015-01-28 ENCOUNTER — Telehealth: Payer: Self-pay | Admitting: *Deleted

## 2015-01-28 ENCOUNTER — Ambulatory Visit: Payer: Self-pay

## 2015-01-28 ENCOUNTER — Other Ambulatory Visit: Payer: Self-pay | Admitting: *Deleted

## 2015-01-28 DIAGNOSIS — C329 Malignant neoplasm of larynx, unspecified: Secondary | ICD-10-CM

## 2015-01-28 NOTE — Telephone Encounter (Signed)
Called patient to confirm she was coming in today for IVF. Patient states she has been drinking a lot and feeling better.  Doesn't want to come in today will just come tomorrow for pump dc and IVF.  Told patient to call if she is feeling worse.

## 2015-01-29 ENCOUNTER — Ambulatory Visit (HOSPITAL_BASED_OUTPATIENT_CLINIC_OR_DEPARTMENT_OTHER): Payer: Self-pay

## 2015-01-29 ENCOUNTER — Other Ambulatory Visit: Payer: Self-pay | Admitting: *Deleted

## 2015-01-29 DIAGNOSIS — C321 Malignant neoplasm of supraglottis: Secondary | ICD-10-CM

## 2015-01-29 DIAGNOSIS — C329 Malignant neoplasm of larynx, unspecified: Secondary | ICD-10-CM

## 2015-01-29 MED ORDER — HEPARIN SOD (PORK) LOCK FLUSH 100 UNIT/ML IV SOLN
500.0000 [IU] | Freq: Once | INTRAVENOUS | Status: AC | PRN
  Administered 2015-01-29: 500 [IU]
  Filled 2015-01-29: qty 5

## 2015-01-29 MED ORDER — PEGFILGRASTIM INJECTION 6 MG/0.6ML ~~LOC~~
PREFILLED_SYRINGE | SUBCUTANEOUS | Status: AC
  Filled 2015-01-29: qty 0.6

## 2015-01-29 MED ORDER — MAGIC MOUTHWASH W/LIDOCAINE: 5.0000 mL | Freq: Three times a day (TID) | ORAL | Status: DC

## 2015-01-29 MED ORDER — PEGFILGRASTIM INJECTION 6 MG/0.6ML ~~LOC~~
6.0000 mg | PREFILLED_SYRINGE | Freq: Once | SUBCUTANEOUS | Status: AC
  Administered 2015-01-29: 6 mg via SUBCUTANEOUS

## 2015-01-29 MED ORDER — SODIUM CHLORIDE 0.9 % IJ SOLN
10.0000 mL | INTRAMUSCULAR | Status: DC | PRN
  Administered 2015-01-29: 10 mL
  Filled 2015-01-29: qty 10

## 2015-01-29 NOTE — Patient Instructions (Signed)
Pegfilgrastim injection What is this medicine? PEGFILGRASTIM (peg fil GRA stim) is a long-acting granulocyte colony-stimulating factor that stimulates the growth of neutrophils, a type of white blood cell important in the body's fight against infection. It is used to reduce the incidence of fever and infection in patients with certain types of cancer who are receiving chemotherapy that affects the bone marrow. This medicine may be used for other purposes; ask your health care provider or pharmacist if you have questions. COMMON BRAND NAME(S): Neulasta What should I tell my health care provider before I take this medicine? They need to know if you have any of these conditions: -latex allergy -ongoing radiation therapy -sickle cell disease -skin reactions to acrylic adhesives (On-Body Injector only) -an unusual or allergic reaction to pegfilgrastim, filgrastim, other medicines, foods, dyes, or preservatives -pregnant or trying to get pregnant -breast-feeding How should I use this medicine? This medicine is for injection under the skin. If you get this medicine at home, you will be taught how to prepare and give the pre-filled syringe or how to use the On-body Injector. Refer to the patient Instructions for Use for detailed instructions. Use exactly as directed. Take your medicine at regular intervals. Do not take your medicine more often than directed. It is important that you put your used needles and syringes in a special sharps container. Do not put them in a trash can. If you do not have a sharps container, call your pharmacist or healthcare provider to get one. Talk to your pediatrician regarding the use of this medicine in children. Special care may be needed. Overdosage: If you think you have taken too much of this medicine contact a poison control center or emergency room at once. NOTE: This medicine is only for you. Do not share this medicine with others. What if I miss a dose? It is  important not to miss your dose. Call your doctor or health care professional if you miss your dose. If you miss a dose due to an On-body Injector failure or leakage, a new dose should be administered as soon as possible using a single prefilled syringe for manual use. What may interact with this medicine? Interactions have not been studied. Give your health care provider a list of all the medicines, herbs, non-prescription drugs, or dietary supplements you use. Also tell them if you smoke, drink alcohol, or use illegal drugs. Some items may interact with your medicine. This list may not describe all possible interactions. Give your health care provider a list of all the medicines, herbs, non-prescription drugs, or dietary supplements you use. Also tell them if you smoke, drink alcohol, or use illegal drugs. Some items may interact with your medicine. What should I watch for while using this medicine? You may need blood work done while you are taking this medicine. If you are going to need a MRI, CT scan, or other procedure, tell your doctor that you are using this medicine (On-Body Injector only). What side effects may I notice from receiving this medicine? Side effects that you should report to your doctor or health care professional as soon as possible: -allergic reactions like skin rash, itching or hives, swelling of the face, lips, or tongue -dizziness -fever -pain, redness, or irritation at site where injected -pinpoint red spots on the skin -shortness of breath or breathing problems -stomach or side pain, or pain at the shoulder -swelling -tiredness -trouble passing urine Side effects that usually do not require medical attention (report to your doctor   or health care professional if they continue or are bothersome): -bone pain -muscle pain This list may not describe all possible side effects. Call your doctor for medical advice about side effects. You may report side effects to FDA at  1-800-FDA-1088. Where should I keep my medicine? Keep out of the reach of children. Store pre-filled syringes in a refrigerator between 2 and 8 degrees C (36 and 46 degrees F). Do not freeze. Keep in carton to protect from light. Throw away this medicine if it is left out of the refrigerator for more than 48 hours. Throw away any unused medicine after the expiration date. NOTE: This sheet is a summary. It may not cover all possible information. If you have questions about this medicine, talk to your doctor, pharmacist, or health care provider.  2015, Elsevier/Gold Standard. (2014-03-05 16:14:05)  

## 2015-01-29 NOTE — Progress Notes (Signed)
Complain of sore throat, Rx called in for Magic Mouthwash.  Refuses IVFs  states is drinking and eating fine.

## 2015-02-01 ENCOUNTER — Other Ambulatory Visit: Payer: Self-pay | Admitting: *Deleted

## 2015-02-01 DIAGNOSIS — C329 Malignant neoplasm of larynx, unspecified: Secondary | ICD-10-CM

## 2015-02-01 MED ORDER — ACYCLOVIR 400 MG PO TABS: 800.0000 mg | ORAL_TABLET | Freq: Two times a day (BID) | ORAL | Status: DC

## 2015-02-01 MED ORDER — FLUCONAZOLE 200 MG PO TABS: 200.0000 mg | ORAL_TABLET | Freq: Every day | ORAL | Status: DC

## 2015-02-01 MED ORDER — LIDOCAINE VISCOUS 2 % MT SOLN: 10.0000 mL | OROMUCOSAL | Status: DC | PRN

## 2015-02-01 NOTE — Telephone Encounter (Signed)
Mouth pain. Small blisters and sore to inside of mouth, tongue and esophagus. Hard to drink/eat. Dr Marin Olp notified and orders received.

## 2015-02-02 ENCOUNTER — Emergency Department (HOSPITAL_BASED_OUTPATIENT_CLINIC_OR_DEPARTMENT_OTHER)
Admission: EM | Admit: 2015-02-02 | Discharge: 2015-02-02 | Disposition: A | Discharge status: Home / Self Care | Payer: Medicaid Other | Attending: Emergency Medicine | Admitting: Emergency Medicine

## 2015-02-02 ENCOUNTER — Telehealth: Payer: Self-pay | Admitting: *Deleted

## 2015-02-02 ENCOUNTER — Encounter (HOSPITAL_BASED_OUTPATIENT_CLINIC_OR_DEPARTMENT_OTHER): Payer: Self-pay | Admitting: *Deleted

## 2015-02-02 DIAGNOSIS — D72829 Elevated white blood cell count, unspecified: Secondary | ICD-10-CM | POA: Insufficient documentation

## 2015-02-02 DIAGNOSIS — Z7952 Long term (current) use of systemic steroids: Secondary | ICD-10-CM | POA: Insufficient documentation

## 2015-02-02 DIAGNOSIS — Z792 Long term (current) use of antibiotics: Secondary | ICD-10-CM | POA: Diagnosis not present

## 2015-02-02 DIAGNOSIS — Z87891 Personal history of nicotine dependence: Secondary | ICD-10-CM | POA: Insufficient documentation

## 2015-02-02 DIAGNOSIS — R112 Nausea with vomiting, unspecified: Secondary | ICD-10-CM | POA: Diagnosis present

## 2015-02-02 DIAGNOSIS — Z21 Asymptomatic human immunodeficiency virus [HIV] infection status: Secondary | ICD-10-CM | POA: Diagnosis not present

## 2015-02-02 DIAGNOSIS — Z79899 Other long term (current) drug therapy: Secondary | ICD-10-CM | POA: Diagnosis not present

## 2015-02-02 DIAGNOSIS — R1084 Generalized abdominal pain: Secondary | ICD-10-CM | POA: Diagnosis not present

## 2015-02-02 HISTORY — DX: Malignant (primary) neoplasm, unspecified: C80.1

## 2015-02-02 LAB — CBC WITH DIFFERENTIAL/PLATELET
BAND NEUTROPHILS: 10 % (ref 0–10)
BASOS ABS: 0 10*3/uL (ref 0.0–0.1)
Basophils Relative: 0 % (ref 0–1)
Blasts: 0 %
EOS ABS: 0.3 10*3/uL (ref 0.0–0.7)
EOS PCT: 2 % (ref 0–5)
HEMATOCRIT: 28.9 % — AB (ref 36.0–46.0)
Hemoglobin: 9 g/dL — ABNORMAL LOW (ref 12.0–15.0)
LYMPHS ABS: 5.9 10*3/uL — AB (ref 0.7–4.0)
LYMPHS PCT: 34 % (ref 12–46)
MCH: 22.8 pg — ABNORMAL LOW (ref 26.0–34.0)
MCHC: 31.1 g/dL (ref 30.0–36.0)
MCV: 73.2 fL — AB (ref 78.0–100.0)
METAMYELOCYTES PCT: 0 %
Monocytes Absolute: 2.8 10*3/uL — ABNORMAL HIGH (ref 0.1–1.0)
Monocytes Relative: 16 % — ABNORMAL HIGH (ref 3–12)
Myelocytes: 0 %
Neutro Abs: 8.4 10*3/uL — ABNORMAL HIGH (ref 1.7–7.7)
Neutrophils Relative %: 38 % — ABNORMAL LOW (ref 43–77)
Platelets: 246 10*3/uL (ref 150–400)
Promyelocytes Absolute: 0 %
RBC: 3.95 MIL/uL (ref 3.87–5.11)
RDW: 24.5 % — ABNORMAL HIGH (ref 11.5–15.5)
WBC: 17.4 10*3/uL — AB (ref 4.0–10.5)
nRBC: 2 /100 WBC — ABNORMAL HIGH

## 2015-02-02 LAB — COMPREHENSIVE METABOLIC PANEL
ALBUMIN: 3.3 g/dL — AB (ref 3.5–5.2)
ALT: 16 U/L (ref 0–35)
AST: 18 U/L (ref 0–37)
Alkaline Phosphatase: 102 U/L (ref 39–117)
Anion gap: 3 — ABNORMAL LOW (ref 5–15)
BUN: 9 mg/dL (ref 6–23)
CHLORIDE: 101 mmol/L (ref 96–112)
CO2: 27 mmol/L (ref 19–32)
CREATININE: 0.54 mg/dL (ref 0.50–1.10)
Calcium: 8.5 mg/dL (ref 8.4–10.5)
GFR calc Af Amer: >90 mL/min (ref >90)
Glucose, Bld: 104 mg/dL — ABNORMAL HIGH (ref 70–99)
POTASSIUM: 3.4 mmol/L — AB (ref 3.5–5.1)
SODIUM: 131 mmol/L — AB (ref 135–145)
Total Bilirubin: 0.1 mg/dL — ABNORMAL LOW (ref 0.3–1.2)
Total Protein: 6.6 g/dL (ref 6.0–8.3)

## 2015-02-02 LAB — URINALYSIS, ROUTINE W REFLEX MICROSCOPIC
Glucose, UA: NEGATIVE mg/dL
Hgb urine dipstick: NEGATIVE
Ketones, ur: 15 mg/dL — AB
Nitrite: NEGATIVE
PH: 6 (ref 5.0–8.0)
Protein, ur: NEGATIVE mg/dL
SPECIFIC GRAVITY, URINE: 1.02 (ref 1.005–1.030)
Urobilinogen, UA: 0.2 mg/dL (ref 0.0–1.0)

## 2015-02-02 LAB — URINE MICROSCOPIC-ADD ON

## 2015-02-02 LAB — I-STAT CG4 LACTIC ACID, ED: LACTIC ACID, VENOUS: 1.1 mmol/L (ref 0.5–2.0)

## 2015-02-02 LAB — LIPASE, BLOOD: Lipase: 26 U/L (ref 11–59)

## 2015-02-02 MED ORDER — HYDROMORPHONE HCL 1 MG/ML IJ SOLN
1.0000 mg | Freq: Once | INTRAMUSCULAR | Status: AC
  Administered 2015-02-02: 1 mg via INTRAVENOUS
  Filled 2015-02-02: qty 1

## 2015-02-02 MED ORDER — HEPARIN SOD (PORK) LOCK FLUSH 100 UNIT/ML IV SOLN
INTRAVENOUS | Status: AC
  Filled 2015-02-02: qty 5

## 2015-02-02 MED ORDER — ONDANSETRON HCL 4 MG/2ML IJ SOLN
4.0000 mg | Freq: Once | INTRAMUSCULAR | Status: AC
  Administered 2015-02-02: 4 mg via INTRAVENOUS
  Filled 2015-02-02: qty 2

## 2015-02-02 MED ORDER — LIDOCAINE VISCOUS 2 % MT SOLN: 15.0000 mL | Freq: Once | OROMUCOSAL | Status: DC

## 2015-02-02 MED ORDER — SODIUM CHLORIDE 0.9 % IV BOLUS (SEPSIS)
1000.0000 mL | Freq: Once | INTRAVENOUS | Status: AC
  Administered 2015-02-02: 1000 mL via INTRAVENOUS

## 2015-02-02 NOTE — Discharge Instructions (Signed)
As discussed, it is important that you follow up as soon as possible with your physician for continued management of your condition. ° °If you develop any new, or concerning changes in your condition, please return to the emergency department immediately. ° °

## 2015-02-02 NOTE — ED Notes (Signed)
R chest power port accessed with  Power loc port access kit, flushed w/o difficulty, good blood return, flows to gravity, sterile technique used, site wnl

## 2015-02-02 NOTE — ED Notes (Signed)
Hurting all over, vomiting, headache.

## 2015-02-02 NOTE — ED Notes (Signed)
MD at bedside. 

## 2015-02-02 NOTE — Telephone Encounter (Signed)
Patient stating she is no longer able to drink or eat. All attempts at taking medications results in vomiting. She is very weak, and complains of intense pain. She feels like she needs to go to the emergency room. Patient advised to go to the [facility] Emergency room for workup, possible fluids and potential pain management. Patient is agreeable to this plan and will head there shortly.

## 2015-02-02 NOTE — ED Provider Notes (Signed)
CSN: [id number]     Arrival date & time 02/02/15  1603 History     Chief Complaint  Patient presents with  . Emesis   The history is provided by the patient. No language interpreter was used.     HPI Comments:  Natalie PIGGOTT is a 49 y.o. female who presents to the Emergency Department complaining of ongoing nausea, vomiting, generalized discomfort. Patient was scheduled for chemotherapy today, but was feeling too poorly to tolerate therapy. Patient has oropharyngeal malignancy. This episode of nausea, vomiting, generalized discomfort began about 2 days ago.  Since onset has been progressive, now with by mouth intolerance, persistent nausea or No fever, dyspnea, specific ongoing abdominal pain. No confusion, disorientation. Patient is incapable of taking any medication for symptom relief.   No alleviating factors noted.   Past Medical History  Diagnosis Date  . HIV (human immunodeficiency virus infection)    Past Surgical History  Procedure Laterality Date  . Cesarean section     No family history on file. History  Substance Use Topics  . Smoking status: Former Smoker -- 0.50 packs/day for 32 years    Types: Cigarettes    Start date: 01/13/1986    Quit date: 11/26/2014  . Smokeless tobacco: Never Used     Comment: quit smoking December 2015  . Alcohol Use: No   OB History    No data available     Review of Systems  Constitutional:       Per HPI, otherwise negative  HENT:       Per HPI, otherwise negative  Respiratory:       Per HPI, otherwise negative  Cardiovascular:       Per HPI, otherwise negative  Gastrointestinal: Positive for nausea and vomiting.  Endocrine:       Negative aside from HPI  Genitourinary:       Neg aside from HPI   Musculoskeletal:       Per HPI, otherwise negative  Skin: Negative.   Neurological: Negative for syncope.  All other systems reviewed and are negative.     Allergies  Ancef and Ciprofloxacin  Home Medications    Prior to Admission medications   Medication Sig Start Date End Date Taking? Authorizing Provider  acetaminophen-codeine 120-12 MG/5ML suspension Take 5 mLs by mouth every 6 (six) hours as needed for pain. 12/15/14   Blanchie Dessert, MD  acyclovir (ZOVIRAX) 400 MG tablet Take 2 tablets (800 mg total) by mouth 2 (two) times daily. 02/01/15   Volanda Napoleon, MD  Alum & Mag Hydroxide-Simeth (MAGIC MOUTHWASH W/LIDOCAINE) SOLN Take 5 mLs by mouth 3 (three) times daily. 01/29/15   Volanda Napoleon, MD  cephALEXin (KEFLEX) 500 MG capsule Take 500 mg by mouth 2 (two) times daily.    Historical Provider, MD  dexamethasone (DECADRON) 4 MG tablet Take 2 tablets (8 mg total) by mouth 2 (two) times daily. Start the day before Taxotere. Take once the day after, then 2 times a day x 2d. 01/22/15   Volanda Napoleon, MD  docusate sodium (COLACE) 100 MG capsule Take 100 mg by mouth 2 (two) times daily.    Historical Provider, MD  fluconazole (DIFLUCAN) 200 MG tablet Take 1 tablet (200 mg total) by mouth daily. 02/01/15   Volanda Napoleon, MD  HYDROcodone-acetaminophen (NORCO) 10-325 MG per tablet Take 1 tablet by mouth every 6 (six) hours as needed for moderate pain.  01/11/15 02/10/15  Historical Provider, MD  ibuprofen (ADVIL,MOTRIN) 200  MG tablet Take 400-600 mg by mouth at bedtime.     Historical Provider, MD  lidocaine (XYLOCAINE) 2 % solution Use as directed 10 mLs in the mouth or throat every 4 (four) hours as needed for mouth pain. Swish & swallow 02/01/15   Volanda Napoleon, MD  lidocaine-prilocaine (EMLA) cream Apply to affected area once 01/22/15   Volanda Napoleon, MD  LORazepam (ATIVAN) 0.5 MG tablet Take 1 tablet (0.5 mg total) by mouth every 6 (six) hours as needed (Nausea or vomiting). 01/22/15   Volanda Napoleon, MD  Nutritional Supplements (ENSURE COMPLETE SHAKE) LIQD Take 1 Bottle by mouth 2 (two) times daily. 01/25/15   Volanda Napoleon, MD  ondansetron (ZOFRAN) 4 MG tablet Take 1 tablet (4 mg total) by mouth every  6 (six) hours. 08/17/14   Domenic Moras, PA-C  ondansetron (ZOFRAN) 8 MG tablet Take 1 tablet (8 mg total) by mouth 2 (two) times daily as needed (Nausea or vomiting). Begin 4 days after chemotherapy. 01/22/15   Volanda Napoleon, MD  oxyCODONE (OXY IR/ROXICODONE) 5 MG immediate release tablet Take 5 mg by mouth every 6 (six) hours as needed for moderate pain.  01/07/15   Historical Provider, MD  prochlorperazine (COMPAZINE) 10 MG tablet Take 1 tablet (10 mg total) by mouth every 6 (six) hours as needed (Nausea or vomiting). 01/22/15   Volanda Napoleon, MD   BP 127/66 mmHg  Pulse 98  Temp(Src) 98.7 F (37.1 C) (Oral)  Resp 20  Ht 5\' 7"  (1.702 m)  Wt 121 lb (54.885 kg)  BMI 18.95 kg/m2  SpO2 99%  LMP 01/12/2015 Physical Exam  Constitutional: She is oriented to person, place, and time. No distress.  Very uncomfortable appearing cachectic female  HENT:  Head: Normocephalic and atraumatic.  Eyes: Conjunctivae and EOM are normal.  Cardiovascular: Normal rate and regular rhythm.   Pulmonary/Chest: Effort normal and breath sounds normal. No stridor. No respiratory distress.  Abdominal:  Mild diffuse tenderness  Musculoskeletal: She exhibits no edema.  Neurological: She is alert and oriented to person, place, and time. She displays atrophy. She displays no tremor. No cranial nerve deficit. She displays no seizure activity.  Skin: Skin is warm and dry.  Psychiatric: She has a normal mood and affect.  Nursing note and vitals reviewed.   ED Course  Procedures   DIAGNOSTIC STUDIES:  Oxygen Saturation is 99% on RA, normal by my interpretation.    COORDINATION OF CARE: After my initial evaluation I reviewed the patient's chart including her oncology visit notes. Patient has supraglottic neck mass with bilateral necrotic lymph nodes.  I reviewed the labs.  Patient has mild leukocytosis.  She remains afebrile.  Update: Patient markedly better, states that she feels substantially better.   MDM   Patient with oropharyngeal carcinoma presents with nausea, vomiting.  Patient has minimal abdominal discomfort, and all symptoms resolved after fluid resuscitation, antiemetics, analgesics. Patient remained hemodynamically stable throughout, and her improvement is consistent with dehydration, likely secondary to persistent nausea and vomiting. With improvement, she was discharged in stable condition to follow-up with her oncologist tomorrow.  Carmin Muskrat, MD 02/03/15 [phone]

## 2015-02-04 ENCOUNTER — Telehealth: Payer: Self-pay | Admitting: Hematology & Oncology

## 2015-02-04 ENCOUNTER — Encounter: Payer: Self-pay | Admitting: Family

## 2015-02-04 ENCOUNTER — Other Ambulatory Visit: Payer: Self-pay | Admitting: *Deleted

## 2015-02-04 ENCOUNTER — Ambulatory Visit (HOSPITAL_BASED_OUTPATIENT_CLINIC_OR_DEPARTMENT_OTHER): Payer: Medicaid Other | Admitting: Family

## 2015-02-04 ENCOUNTER — Other Ambulatory Visit (HOSPITAL_BASED_OUTPATIENT_CLINIC_OR_DEPARTMENT_OTHER): Payer: Medicaid Other | Admitting: Lab

## 2015-02-04 VITALS — BP 119/55 | HR 86 | Temp 97.6°F | Resp 16 | Ht 66.0 in | Wt 121.0 lb

## 2015-02-04 DIAGNOSIS — C329 Malignant neoplasm of larynx, unspecified: Secondary | ICD-10-CM

## 2015-02-04 LAB — COMPREHENSIVE METABOLIC PANEL
ALBUMIN: 3.3 g/dL — AB (ref 3.5–5.2)
ALT: 11 U/L (ref 0–35)
AST: 15 U/L (ref 0–37)
Alkaline Phosphatase: 126 U/L — ABNORMAL HIGH (ref 39–117)
BUN: 7 mg/dL (ref 6–23)
CALCIUM: 8.2 mg/dL — AB (ref 8.4–10.5)
CO2: 25 mEq/L (ref 19–32)
Chloride: 102 mEq/L (ref 96–112)
Creatinine, Ser: 0.63 mg/dL (ref 0.50–1.10)
GLUCOSE: 111 mg/dL — AB (ref 70–99)
POTASSIUM: 3.2 meq/L — AB (ref 3.5–5.3)
SODIUM: 136 meq/L (ref 135–145)
Total Bilirubin: 0.2 mg/dL (ref 0.2–1.2)
Total Protein: 5.8 g/dL — ABNORMAL LOW (ref 6.0–8.3)

## 2015-02-04 LAB — CBC WITH DIFFERENTIAL (CANCER CENTER ONLY)
BASO#: 0 10*3/uL (ref 0.0–0.2)
BASO%: 0.1 % (ref 0.0–2.0)
EOS ABS: 0.1 10*3/uL (ref 0.0–0.5)
EOS%: 0.2 % (ref 0.0–7.0)
HEMATOCRIT: 29.8 % — AB (ref 34.8–46.6)
HEMOGLOBIN: 9.2 g/dL — AB (ref 11.6–15.9)
LYMPH#: 3.4 10*3/uL — ABNORMAL HIGH (ref 0.9–3.3)
LYMPH%: 11.1 % — ABNORMAL LOW (ref 14.0–48.0)
MCH: 23.3 pg — ABNORMAL LOW (ref 26.0–34.0)
MCHC: 30.9 g/dL — AB (ref 32.0–36.0)
MCV: 75 fL — ABNORMAL LOW (ref 81–101)
MONO#: 4.6 10*3/uL — AB (ref 0.1–0.9)
MONO%: 15.2 % — ABNORMAL HIGH (ref 0.0–13.0)
NEUT%: 73.4 % (ref 39.6–80.0)
NEUTROS ABS: 22.4 10*3/uL — AB (ref 1.5–6.5)
Platelets: 252 10*3/uL (ref 145–400)
RBC: 3.95 10*6/uL (ref 3.70–5.32)
RDW: 25.6 % — ABNORMAL HIGH (ref 11.1–15.7)
WBC: 30.5 10*3/uL — ABNORMAL HIGH (ref 3.9–10.0)

## 2015-02-04 LAB — TECHNOLOGIST REVIEW CHCC SATELLITE

## 2015-02-04 LAB — LACTATE DEHYDROGENASE: LDH: 224 U/L (ref 94–250)

## 2015-02-04 LAB — PREALBUMIN: Prealbumin: 16.2 mg/dL — ABNORMAL LOW (ref 17.0–34.0)

## 2015-02-04 MED ORDER — PROCHLORPERAZINE MALEATE 10 MG PO TABS: 10.0000 mg | ORAL_TABLET | Freq: Four times a day (QID) | ORAL | Status: DC | PRN

## 2015-02-04 MED ORDER — FENTANYL 25 MCG/HR TD PT72: 25.0000 ug | MEDICATED_PATCH | TRANSDERMAL | Status: DC

## 2015-02-04 MED ORDER — LORAZEPAM 0.5 MG PO TABS: 0.5000 mg | ORAL_TABLET | Freq: Four times a day (QID) | ORAL | Status: DC | PRN

## 2015-02-04 MED ORDER — DEXAMETHASONE 4 MG PO TABS: 8.0000 mg | ORAL_TABLET | Freq: Two times a day (BID) | ORAL | Status: DC

## 2015-02-04 NOTE — Telephone Encounter (Signed)
Pt brought back [facility] [place] appl and it is being forwarded inter-ofc to:  [facility] Nolene Bernheim: [phone]  Patient Accts [place] [employer]  PAR-Customer Svc/Self Pay Rep  SW-Pt Acct-Customer Service   [email or web address]

## 2015-02-04 NOTE — Progress Notes (Signed)
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 [name] is here today after having n/v and a sore throat and going to the ED on Tuesday night. She was given fluids and released. She is a recent diagnosis of laryngeal cancer and is s/p cycle 1 of treatment. She has sores in her throat which make it difficult for her to eat and drink. She has started using the lidocaine gel and states that it has really helped with the pain.  She also states that the oxycodone bothered her stomach so she is no longer taking that. She feels that the Norco and Fentanyl patches are controlling her pain.  The swelling in her throat comes and goes. She is feeling fine right now.  She did not get her prescriptions for filled for the Decadron, Compazine or Ativan. The orders did not go through. We reordered these again today and verified with the pharmacy that they were received. She has refill prescriptions in hand for the ativan and fentanyl. This should really make a huge difference with her symptom management.   She denies fever, chills, cough, rash, dizziness, SOB, chest pain, palpitations, abdominal [ain, constipation, diarrhea, blood in urine or stool. She has headaches at times behind her right ear. This is not a new issue.  She has no lymphadenopathy today.  No swelling, tenderness, numbness or tingling in her extremities. No new aches or pains.  Her appetite is getting better since she started using the lidocaine mouth rinse and she is able to hold down fluids. Her weight is stable at 121 lbs. She is followed by Dr. Wynetta Emery with [facility]. We will fax her a note to let her know that we have started her on a 25 mcg Fentanyl Duragesic  patch. She is currently living with her aunt.   CURRENT TREATMENT: Docetaxel Cisplatin D1/ 5FU IVCI D1-D5 q 21 days  REVIEW OF SYSTEMS: All other 10 point review of systems is negative.   PAST MEDICAL HISTORY: Past Medical History  Diagnosis Date  . HIV (human immunodeficiency virus infection)   . Cancer     head and neck    PAST SURGICAL HISTORY: Past Surgical History  Procedure Laterality Date  . Cesarean section      FAMILY HISTORY No family history on file.  GYNECOLOGIC HISTORY:  Patient's last menstrual period was 01/12/2015.   SOCIAL HISTORY: History   Social History  . Marital Status: Widowed    Spouse Name: N/A  . Number of Children: N/A  . Years of Education: N/A   Occupational History  . Not on file.   Social History Main Topics  . Smoking status: Former Smoker -- 0.50 packs/day for 32 years    Types: Cigarettes    Start date: 01/13/1986    Quit date: 11/26/2014  . Smokeless tobacco: Never Used     Comment: quit smoking December 2015  . Alcohol Use: No  . Drug Use: No  . Sexual Activity: Not on file   Other Topics Concern  . Not on file   Social History Narrative    ADVANCED DIRECTIVES:  <no information>  HEALTH MAINTENANCE: History  Substance Use Topics  . Smoking status: Former Smoker -- 0.50 packs/day for 32 years  Types: Cigarettes    Start date: 01/13/1986    Quit date: 11/26/2014  . Smokeless tobacco: Never Used     Comment: quit smoking December 2015  . Alcohol Use: No   Colonoscopy: PAP: Bone density: Lipid panel:  Allergies  Allergen Reactions  . Ancef [Cefazolin] Hives  . Ciprofloxacin     hives    Current Outpatient Prescriptions  Medication Sig Dispense Refill  . acetaminophen-codeine 120-12 MG/5ML suspension Take 5 mLs by mouth every 6 (six) hours as needed for pain. 60 mL 1  . acyclovir (ZOVIRAX) 400 MG tablet Take 2 tablets (800 mg total) by mouth 2 (two) times daily. 120 tablet 2  . Alum & Mag  Hydroxide-Simeth (MAGIC MOUTHWASH W/LIDOCAINE) SOLN Take 5 mLs by mouth 3 (three) times daily. 240 mL 0  . cephALEXin (KEFLEX) 500 MG capsule Take 500 mg by mouth 2 (two) times daily.    Marland Kitchen dexamethasone (DECADRON) 4 MG tablet Take 2 tablets (8 mg total) by mouth 2 (two) times daily. Start the day before Taxotere. Take once the day after, then 2 times a day x 2d. 30 tablet 1  . docusate sodium (COLACE) 100 MG capsule Take 100 mg by mouth 2 (two) times daily.    . fluconazole (DIFLUCAN) 200 MG tablet Take 1 tablet (200 mg total) by mouth daily. 30 tablet 2  . HYDROcodone-acetaminophen (NORCO) 10-325 MG per tablet Take 1 tablet by mouth every 6 (six) hours as needed for moderate pain.     Marland Kitchen ibuprofen (ADVIL,MOTRIN) 200 MG tablet Take 400-600 mg by mouth at bedtime.     . lidocaine (XYLOCAINE) 2 % solution Use as directed 10 mLs in the mouth or throat every 4 (four) hours as needed for mouth pain. Swish & swallow 200 mL 0  . lidocaine-prilocaine (EMLA) cream Apply to affected area once 30 g 3  . LORazepam (ATIVAN) 0.5 MG tablet Take 1 tablet (0.5 mg total) by mouth every 6 (six) hours as needed (Nausea or vomiting). 30 tablet 0  . Nutritional Supplements (ENSURE COMPLETE SHAKE) LIQD Take 1 Bottle by mouth 2 (two) times daily. 60 Bottle 12  . ondansetron (ZOFRAN) 4 MG tablet Take 1 tablet (4 mg total) by mouth every 6 (six) hours. 12 tablet 0  . ondansetron (ZOFRAN) 8 MG tablet Take 1 tablet (8 mg total) by mouth 2 (two) times daily as needed (Nausea or vomiting). Begin 4 days after chemotherapy. 30 tablet 1  . oxyCODONE (OXY IR/ROXICODONE) 5 MG immediate release tablet Take 5 mg by mouth every 6 (six) hours as needed for moderate pain.     Marland Kitchen prochlorperazine (COMPAZINE) 10 MG tablet Take 1 tablet (10 mg total) by mouth every 6 (six) hours as needed (Nausea or vomiting). 30 tablet 1   No current facility-administered medications for this visit.    OBJECTIVE: There were no vitals filed for this visit.  There were no vitals filed for this visit. ECOG FS:1 - Symptomatic but completely ambulatory Ocular: Sclerae unicteric, pupils equal, round and reactive to light Ear-nose-throat: Oropharynx clear, dentition fair Lymphatic: No cervical or supraclavicular adenopathy Lungs no rales or rhonchi, good excursion bilaterally Heart regular rate and rhythm, no murmur appreciated Abd soft, nontender, positive bowel sounds MSK no focal spinal tenderness, no joint edema Neuro: non-focal, well-oriented, appropriate affect Breasts: Deferred  LAB RESULTS: CMP     Component Value Date/Time   NA 131* 02/02/2015 1900   K 3.4* 02/02/2015 1900   CL 101 02/02/2015 1900  CO2 27 02/02/2015 1900   GLUCOSE 104* 02/02/2015 1900   BUN 9 02/02/2015 1900   CREATININE 0.54 02/02/2015 1900   CALCIUM 8.5 02/02/2015 1900   PROT 6.6 02/02/2015 1900   ALBUMIN 3.3* 02/02/2015 1900   AST 18 02/02/2015 1900   ALT 16 02/02/2015 1900   ALKPHOS 102 02/02/2015 1900   BILITOT 0.1* 02/02/2015 1900   GFRNONAA >90 02/02/2015 1900   GFRAA >90 02/02/2015 1900   INo results found for: SPEP, UPEP Lab Results  Component Value Date   WBC 17.4* 02/02/2015   NEUTROABS 8.4* 02/02/2015   HGB 9.0* 02/02/2015   HCT 28.9* 02/02/2015   MCV 73.2* 02/02/2015   PLT 246 02/02/2015   No results found for: LABCA2 No components found for: [id number] No results for input(s): INR in the last 168 hours.  STUDIES: None  ASSESSMENT/PLAN: Ms. [name] is a very pleasant 49 yo white female with recently diagnosed laryngeal cancer. She has had n/v and a sore throat. She is feeling better today but still needs to have her prescriptions for decadron, compazine, ativan and fentanyl filled. She is picking them up after leaving here.  She has completed cycle 1 of DC 5FU.  We will see what her labs from today show.  She has her appointment and treatment schedule.  She knows to call here with any questions or concerns and to go to the ED in the  event of an emergency. We can certainly see her sooner if ned be.   Eliezer Bottom, NP 02/04/2015 11:52 AM

## 2015-02-05 ENCOUNTER — Other Ambulatory Visit: Payer: Self-pay | Admitting: Lab

## 2015-02-05 ENCOUNTER — Ambulatory Visit: Payer: Self-pay

## 2015-02-05 ENCOUNTER — Ambulatory Visit: Payer: Self-pay | Admitting: Hematology & Oncology

## 2015-02-09 ENCOUNTER — Encounter: Payer: Self-pay | Admitting: *Deleted

## 2015-02-09 NOTE — Progress Notes (Signed)
[place] Psychosocial Distress Screening Clinical Social Work  Clinical Social Work was referred by distress screening protocol.  The patient scored a 5 on the Psychosocial Distress Thermometer which indicates moderate distress. Clinical Social Worker phoned pt and reviewed her chart to assess for distress and other psychosocial needs. Pt requested CSW to call later as she could not talk right now. CSW to return call.   ONCBCN DISTRESS SCREENING 02/04/2015  Screening Type Initial Screening  Distress experienced in past week (1-10) 5  Practical problem type Food  Family Problem type Other (comment)  Emotional problem type Nervousness/Anxiety  Spiritual/Religous concerns type (No Data)  Information Concerns Type (No Data)  Physical Problem type Nausea/vomiting;Mouth sores/swallowing  Physician notified of physical symptoms Yes  Referral to clinical psychology No  Referral to clinical social work No  Referral to dietition No  Referral to financial advocate No  Referral to support programs No  Referral to palliative care No    Clinical Social Worker follow up needed: Yes.    If yes, follow up plan:  See above Loren Racer, [place] Worker [place]  [facility] Phone: [phone] Fax: [phone]
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
## 2015-02-15 ENCOUNTER — Encounter: Payer: Self-pay | Admitting: Hematology & Oncology

## 2015-02-15 ENCOUNTER — Other Ambulatory Visit: Payer: Self-pay | Admitting: *Deleted

## 2015-02-15 ENCOUNTER — Ambulatory Visit (HOSPITAL_BASED_OUTPATIENT_CLINIC_OR_DEPARTMENT_OTHER): Payer: Medicaid Other

## 2015-02-15 ENCOUNTER — Other Ambulatory Visit (HOSPITAL_BASED_OUTPATIENT_CLINIC_OR_DEPARTMENT_OTHER): Payer: Medicaid Other | Admitting: Lab

## 2015-02-15 ENCOUNTER — Ambulatory Visit (HOSPITAL_BASED_OUTPATIENT_CLINIC_OR_DEPARTMENT_OTHER): Payer: Medicaid Other | Admitting: Hematology & Oncology

## 2015-02-15 VITALS — BP 102/67 | HR 91 | Temp 98.1°F | Resp 14 | Ht 66.0 in | Wt 123.0 lb

## 2015-02-15 DIAGNOSIS — C321 Malignant neoplasm of supraglottis: Secondary | ICD-10-CM

## 2015-02-15 DIAGNOSIS — D509 Iron deficiency anemia, unspecified: Secondary | ICD-10-CM

## 2015-02-15 DIAGNOSIS — C329 Malignant neoplasm of larynx, unspecified: Secondary | ICD-10-CM

## 2015-02-15 DIAGNOSIS — Z5111 Encounter for antineoplastic chemotherapy: Secondary | ICD-10-CM

## 2015-02-15 DIAGNOSIS — D501 Sideropenic dysphagia: Secondary | ICD-10-CM

## 2015-02-15 LAB — CBC WITH DIFFERENTIAL (CANCER CENTER ONLY)
BASO#: 0.1 10*3/uL (ref 0.0–0.2)
BASO%: 0.9 % (ref 0.0–2.0)
EOS%: 1.2 % (ref 0.0–7.0)
Eosinophils Absolute: 0.1 10*3/uL (ref 0.0–0.5)
HEMATOCRIT: 28.2 % — AB (ref 34.8–46.6)
HGB: 8.6 g/dL — ABNORMAL LOW (ref 11.6–15.9)
LYMPH#: 1.5 10*3/uL (ref 0.9–3.3)
LYMPH%: 20.7 % (ref 14.0–48.0)
MCH: 23.5 pg — ABNORMAL LOW (ref 26.0–34.0)
MCHC: 30.5 g/dL — ABNORMAL LOW (ref 32.0–36.0)
MCV: 77 fL — ABNORMAL LOW (ref 81–101)
MONO#: 1 10*3/uL — ABNORMAL HIGH (ref 0.1–0.9)
MONO%: 12.9 % (ref 0.0–13.0)
NEUT#: 4.8 10*3/uL (ref 1.5–6.5)
NEUT%: 64.3 % (ref 39.6–80.0)
PLATELETS: 516 10*3/uL — AB (ref 145–400)
RBC: 3.66 10*6/uL — AB (ref 3.70–5.32)
RDW: 25.5 % — ABNORMAL HIGH (ref 11.1–15.7)
WBC: 7.5 10*3/uL (ref 3.9–10.0)

## 2015-02-15 LAB — PREALBUMIN: Prealbumin: 11.2 mg/dL — ABNORMAL LOW (ref 17.0–34.0)

## 2015-02-15 LAB — LACTATE DEHYDROGENASE: LDH: 137 U/L (ref 94–250)

## 2015-02-15 LAB — CMP (CANCER CENTER ONLY)
ALBUMIN: 2.6 g/dL — AB (ref 3.3–5.5)
ALT(SGPT): 16 U/L (ref 10–47)
AST: 20 U/L (ref 11–38)
Alkaline Phosphatase: 152 U/L — ABNORMAL HIGH (ref 26–84)
BUN: 6 mg/dL — AB (ref 7–22)
CHLORIDE: 98 meq/L (ref 98–108)
CO2: 27 mEq/L (ref 18–33)
CREATININE: 0.6 mg/dL (ref 0.6–1.2)
Calcium: 9.3 mg/dL (ref 8.0–10.3)
GLUCOSE: 118 mg/dL (ref 73–118)
POTASSIUM: 3.9 meq/L (ref 3.3–4.7)
Sodium: 136 mEq/L (ref 128–145)
TOTAL PROTEIN: 7.3 g/dL (ref 6.4–8.1)
Total Bilirubin: 0.4 mg/dl (ref 0.20–1.60)

## 2015-02-15 MED ORDER — HYDROMORPHONE HCL 1 MG/ML IJ SOLN
1.0000 mg | Freq: Once | INTRAMUSCULAR | Status: AC
  Administered 2015-02-15: 1 mg via INTRAVENOUS

## 2015-02-15 MED ORDER — POTASSIUM CHLORIDE 2 MEQ/ML IV SOLN
Freq: Once | INTRAVENOUS | Status: AC
  Administered 2015-02-15: 11:00:00 via INTRAVENOUS
  Filled 2015-02-15: qty 10

## 2015-02-15 MED ORDER — DOCETAXEL CHEMO INJECTION 160 MG/16ML
60.0000 mg/m2 | Freq: Once | INTRAVENOUS | Status: AC
  Administered 2015-02-15: 100 mg via INTRAVENOUS
  Filled 2015-02-15: qty 10

## 2015-02-15 MED ORDER — HYDROCODONE-ACETAMINOPHEN 5-325 MG PO TABS
ORAL_TABLET | ORAL | Status: AC
  Filled 2015-02-15: qty 1

## 2015-02-15 MED ORDER — PALONOSETRON HCL INJECTION 0.25 MG/5ML
INTRAVENOUS | Status: AC
  Filled 2015-02-15: qty 5

## 2015-02-15 MED ORDER — HYDROMORPHONE HCL 1 MG/ML IJ SOLN
INTRAMUSCULAR | Status: AC
  Filled 2015-02-15: qty 1

## 2015-02-15 MED ORDER — SODIUM CHLORIDE 0.9 % IV SOLN
150.0000 mg | Freq: Once | INTRAVENOUS | Status: AC
  Administered 2015-02-15: 150 mg via INTRAVENOUS
  Filled 2015-02-15: qty 5

## 2015-02-15 MED ORDER — SODIUM CHLORIDE 0.9 % IV SOLN
Freq: Once | INTRAVENOUS | Status: AC
  Administered 2015-02-15: 10:00:00 via INTRAVENOUS

## 2015-02-15 MED ORDER — SODIUM CHLORIDE 0.9 % IV SOLN
600.0000 mg/m2/d | INTRAVENOUS | Status: DC
  Administered 2015-02-15: 3850 mg via INTRAVENOUS
  Filled 2015-02-15: qty 77

## 2015-02-15 MED ORDER — DEXAMETHASONE SODIUM PHOSPHATE 20 MG/5ML IJ SOLN
INTRAMUSCULAR | Status: AC
  Filled 2015-02-15: qty 5

## 2015-02-15 MED ORDER — SODIUM CHLORIDE 0.9 % IV SOLN
60.0000 mg/m2 | Freq: Once | INTRAVENOUS | Status: AC
  Administered 2015-02-15: 96 mg via INTRAVENOUS
  Filled 2015-02-15: qty 96

## 2015-02-15 MED ORDER — VARENICLINE TARTRATE 0.5 MG PO TABS: 0.5000 mg | ORAL_TABLET | Freq: Two times a day (BID) | ORAL | Status: DC

## 2015-02-15 MED ORDER — SODIUM CHLORIDE 0.9 % IV SOLN
510.0000 mg | Freq: Once | INTRAVENOUS | Status: AC
  Administered 2015-02-15: 510 mg via INTRAVENOUS
  Filled 2015-02-15: qty 17

## 2015-02-15 MED ORDER — PALONOSETRON HCL INJECTION 0.25 MG/5ML
0.2500 mg | Freq: Once | INTRAVENOUS | Status: AC
  Administered 2015-02-15: 0.25 mg via INTRAVENOUS

## 2015-02-15 MED ORDER — HYDROCODONE-ACETAMINOPHEN 5-325 MG PO TABS
1.0000 | ORAL_TABLET | Freq: Once | ORAL | Status: AC
  Administered 2015-02-15: 1 via ORAL

## 2015-02-15 MED ORDER — DEXAMETHASONE SODIUM PHOSPHATE 20 MG/5ML IJ SOLN
12.0000 mg | Freq: Once | INTRAMUSCULAR | Status: AC
  Administered 2015-02-15: 12 mg via INTRAVENOUS

## 2015-02-15 NOTE — Patient Instructions (Signed)
[facility] Discharge Instructions for Patients Receiving Chemotherapy  Today you received the following chemotherapy agents: Taxotere and Cisplatin.  To help prevent nausea and vomiting after your treatment, we encourage you to take your nausea medication as prescribed.   If you develop nausea and vomiting that is not controlled by your nausea medication, call the clinic.   BELOW ARE SYMPTOMS THAT SHOULD BE REPORTED IMMEDIATELY:  *FEVER GREATER THAN 100.5 F  *CHILLS WITH OR WITHOUT FEVER  NAUSEA AND VOMITING THAT IS NOT CONTROLLED WITH YOUR NAUSEA MEDICATION  *UNUSUAL SHORTNESS OF BREATH  *UNUSUAL BRUISING OR BLEEDING  TENDERNESS IN MOUTH AND THROAT WITH OR WITHOUT PRESENCE OF ULCERS  *URINARY PROBLEMS  *BOWEL PROBLEMS  UNUSUAL RASH Items with * indicate a potential emergency and should be followed up as soon as possible.  Feel free to call the clinic you have any questions or concerns. The clinic phone number is [phone].    

## 2015-02-15 NOTE — Progress Notes (Signed)
Hematology and Oncology Follow Up Visit  AURIANNA EARLYWINE [id number] February 03, 1966 49 y.o. 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  Tortorelli is back for follow-up. She has shows quite good. The with adenopathy in her neck has resolved already. She did have some difficulties with side effects initially. She has some mouth sores. She has some nausea. I'm unsure if she's taken the nausea medication on schedule.  She has gained a lot of weight. Her appetite has come back. There is no mouth sores. Her voice is a whole lot better now. Her voice and it did not sound nearly as course.  She comes in with her aunt. Her aunt is quite pleased with how well she is doing.  She has had no problems with diarrhea. She's had no constipation.  There's been no rashes. She's had no bleeding.  Overall, her performance status is ECOG 1.  Medications:  Current outpatient prescriptions:  .  acyclovir (ZOVIRAX) 400 MG tablet, Take 2 tablets (800 mg total) by mouth 2 (two) times daily., Disp: 120 tablet, Rfl: 2 .  dexamethasone (DECADRON) 4 MG tablet, Take 2 tablets (8 mg total) by mouth 2 (two) times daily. Start the day before Taxotere. Take once the day after, then 2 times a day x 2d., Disp: 30 tablet, Rfl: 1 .  fentaNYL (DURAGESIC - DOSED MCG/HR) 25 MCG/HR patch, Place 1 patch (25 mcg total) onto the skin every 3 (three) days., Disp: 10 patch, Rfl: 0 .  fluconazole (DIFLUCAN) 200 MG tablet, Take 1 tablet (200 mg total) by mouth daily., Disp: 30 tablet, Rfl: 2 .  ibuprofen (ADVIL,MOTRIN) 200 MG tablet, Take 400-600 mg by mouth at bedtime. , Disp: , Rfl:  .  lidocaine (XYLOCAINE) 2 % solution, Use as directed 10 mLs in the mouth or throat every 4 (four) hours as needed for mouth pain. Swish & swallow, Disp: 200 mL, Rfl:  0 .  LORazepam (ATIVAN) 0.5 MG tablet, Take 1 tablet (0.5 mg total) by mouth every 6 (six) hours as needed (Nausea or vomiting)., Disp: 120 tablet, Rfl: 0 .  Nutritional Supplements (ENSURE COMPLETE SHAKE) LIQD, Take 1 Bottle by mouth 2 (two) times daily., Disp: 60 Bottle, Rfl: 12 .  ondansetron (ZOFRAN) 4 MG tablet, Take 1 tablet (4 mg total) by mouth every 6 (six) hours. (Patient taking differently: Take 4 mg by mouth every 6 (six) hours. MODERATE TO SEVERE NAUSEA), Disp: 12 tablet, Rfl: 0 .  prochlorperazine (COMPAZINE) 10 MG tablet, Take 1 tablet (10 mg total) by mouth every 6 (six) hours as needed (Nausea or vomiting)., Disp: 120 tablet, Rfl: 1 .  senna (SENOKOT) 8.6 MG tablet, Take 2 tablets by mouth at bedtime., Disp: , Rfl:  .  Alum & Mag Hydroxide-Simeth (MAGIC MOUTHWASH W/LIDOCAINE) SOLN, Take 5 mLs by mouth 3 (three) times daily. (Patient not taking: Reported on 02/15/2015), Disp: 240 mL, Rfl: 0 .  lidocaine-prilocaine (EMLA) cream, Apply to affected area once (Patient not taking: Reported on 02/15/2015), Disp: 30 g, Rfl: 3 .  ondansetron (ZOFRAN) 8 MG tablet, Take 1 tablet (8 mg total) by mouth 2 (two) times daily as needed (Nausea or vomiting). Begin 4 days after chemotherapy. (Patient not taking: Reported on 02/15/2015), Disp: 30 tablet, Rfl: 1  Allergies:  Allergies  Allergen Reactions  . Ancef [Cefazolin] Hives  . Ciprofloxacin     hives    Past Medical History, Surgical history, Social history, and Family History were reviewed and updated.  Review of Systems: As above  Physical Exam:  height is 5\' 6"  (1.676 m) and weight is 123 lb (55.792 kg). Her oral temperature is 98.1 F (36.7 C). Her blood pressure is 102/67 and her pulse is 91. Her respiration is 14.   Thin white female in no obvious distress. Head and neck exam shows no ocular or oral lesions. I cannot palpate any lymphadenopathy in her neck. She has no mucositis. Lungs are clear. Cardiac exam regular rate and rhythm  with no murmurs, rubs or bruits. Abdomen is soft. She has good bowel sound. There is no fluid wave. There is no palpable liver or spleen tip. Back exam shows no tenderness over the spine, ribs or hips. Extremities shows no clubbing, cyanosis or edema. Skin exam shows no rashes, ecchymoses or petechia. Neurological exam is nonfocal.  Lab Results  Component Value Date   WBC 7.5 02/15/2015   HGB 8.6* 02/15/2015   HCT 28.2* 02/15/2015   MCV 77* 02/15/2015   PLT 516* 02/15/2015     Chemistry      Component Value Date/Time   NA 136 02/15/2015 0837   NA 136 02/04/2015 1116   K 3.9 02/15/2015 0837   K 3.2* 02/04/2015 1116   CL 98 02/15/2015 0837   CL 102 02/04/2015 1116   CO2 27 02/15/2015 0837   CO2 25 02/04/2015 1116   BUN 6* 02/15/2015 0837   BUN 7 02/04/2015 1116   CREATININE 0.6 02/15/2015 0837   CREATININE 0.63 02/04/2015 1116      Component Value Date/Time   CALCIUM 9.3 02/15/2015 0837   CALCIUM 8.2* 02/04/2015 1116   ALKPHOS 152* 02/15/2015 0837   ALKPHOS 126* 02/04/2015 1116   AST 20 02/15/2015 0837   AST 15 02/04/2015 1116   ALT 16 02/15/2015 0837   ALT 11 02/04/2015 1116   BILITOT 0.40 02/15/2015 0837   BILITOT 0.2 02/04/2015 1116         Impression and Plan: Ms. Gully is a 49 year old white female. She has a locally advanced squamous carcinoma of the false focal cord. This is of the right false vocal cord.  After 1 cycle of induction chemotherapy, she is responding. The lymphadenopathy has resolved. Her voice is now without hoarseness.  I still think that we should proceed with a total of 3 cycles of treatment and then we can rescan her.  I spent about a half hour with she and her aunt. I went over her lab work. I explained our recommendations and our plan strategy. She is in agreement.  We will plan to get her back in 3 more weeks for her third and final cycle of induction therapy.  Her iron is on the low side. I will give her dose of IV iron  today.   Volanda Napoleon, MD 2/29/20169:34 AM

## 2015-02-19 ENCOUNTER — Ambulatory Visit (HOSPITAL_BASED_OUTPATIENT_CLINIC_OR_DEPARTMENT_OTHER): Payer: Medicaid Other

## 2015-02-19 DIAGNOSIS — Z5189 Encounter for other specified aftercare: Secondary | ICD-10-CM

## 2015-02-19 DIAGNOSIS — C329 Malignant neoplasm of larynx, unspecified: Secondary | ICD-10-CM

## 2015-02-19 DIAGNOSIS — C321 Malignant neoplasm of supraglottis: Secondary | ICD-10-CM

## 2015-02-19 MED ORDER — HEPARIN SOD (PORK) LOCK FLUSH 100 UNIT/ML IV SOLN
500.0000 [IU] | Freq: Once | INTRAVENOUS | Status: AC | PRN
  Administered 2015-02-19: 500 [IU]
  Filled 2015-02-19: qty 5

## 2015-02-19 MED ORDER — SODIUM CHLORIDE 0.9 % IJ SOLN
10.0000 mL | INTRAMUSCULAR | Status: DC | PRN
  Administered 2015-02-19: 10 mL
  Filled 2015-02-19: qty 10

## 2015-02-19 MED ORDER — PEGFILGRASTIM INJECTION 6 MG/0.6ML ~~LOC~~
6.0000 mg | PREFILLED_SYRINGE | Freq: Once | SUBCUTANEOUS | Status: AC
  Administered 2015-02-19: 6 mg via SUBCUTANEOUS

## 2015-02-19 MED ORDER — PEGFILGRASTIM INJECTION 6 MG/0.6ML ~~LOC~~
PREFILLED_SYRINGE | SUBCUTANEOUS | Status: AC
  Filled 2015-02-19: qty 0.6

## 2015-02-19 NOTE — Patient Instructions (Addendum)
Fluorouracil, 5-FU injection What is this medicine? FLUOROURACIL, 5-FU (flure oh YOOR a sil) is a chemotherapy drug. It slows the growth of cancer cells. This medicine is used to treat many types of cancer like breast cancer, colon or rectal cancer, pancreatic cancer, and stomach cancer. This medicine may be used for other purposes; ask your health care provider or pharmacist if you have questions. COMMON BRAND NAME(S): Adrucil What should I tell my health care provider before I take this medicine? They need to know if you have any of these conditions: -blood disorders -dihydropyrimidine dehydrogenase (DPD) deficiency -infection (especially a virus infection such as chickenpox, cold sores, or herpes) -kidney disease -liver disease -malnourished, poor nutrition -recent or ongoing radiation therapy -an unusual or allergic reaction to fluorouracil, other chemotherapy, other medicines, foods, dyes, or preservatives -pregnant or trying to get pregnant -breast-feeding How should I use this medicine? This drug is given as an infusion or injection into a vein. It is administered in a hospital or clinic by a specially trained health care professional. Talk to your pediatrician regarding the use of this medicine in children. Special care may be needed. Overdosage: If you think you have taken too much of this medicine contact a poison control center or emergency room at once. NOTE: This medicine is only for you. Do not share this medicine with others. What if I miss a dose? It is important not to miss your dose. Call your doctor or health care professional if you are unable to keep an appointment. What may interact with this medicine? -allopurinol -cimetidine -dapsone -digoxin -hydroxyurea -leucovorin -levamisole -medicines for seizures like ethotoin, fosphenytoin, phenytoin -medicines to increase blood counts like filgrastim, pegfilgrastim, sargramostim -medicines that treat or prevent blood  clots like warfarin, enoxaparin, and dalteparin -methotrexate -metronidazole -pyrimethamine -some other chemotherapy drugs like busulfan, cisplatin, estramustine, vinblastine -trimethoprim -trimetrexate -vaccines Talk to your doctor or health care professional before taking any of these medicines: -acetaminophen -aspirin -ibuprofen -ketoprofen -naproxen This list may not describe all possible interactions. Give your health care provider a list of all the medicines, herbs, non-prescription drugs, or dietary supplements you use. Also tell them if you smoke, drink alcohol, or use illegal drugs. Some items may interact with your medicine. What should I watch for while using this medicine? Visit your doctor for checks on your progress. This drug may make you feel generally unwell. This is not uncommon, as chemotherapy can affect healthy cells as well as cancer cells. Report any side effects. Continue your course of treatment even though you feel ill unless your doctor tells you to stop. In some cases, you may be given additional medicines to help with side effects. Follow all directions for their use. Call your doctor or health care professional for advice if you get a fever, chills or sore throat, or other symptoms of a cold or flu. Do not treat yourself. This drug decreases your body's ability to fight infections. Try to avoid being around people who are sick. This medicine may increase your risk to bruise or bleed. Call your doctor or health care professional if you notice any unusual bleeding. Be careful brushing and flossing your teeth or using a toothpick because you may get an infection or bleed more easily. If you have any dental work done, tell your dentist you are receiving this medicine. Avoid taking products that contain aspirin, acetaminophen, ibuprofen, naproxen, or ketoprofen unless instructed by your doctor. These medicines may hide a fever. Do not become pregnant while taking this  medicine. Women should inform their doctor if they wish to become pregnant or think they might be pregnant. There is a potential for serious side effects to an unborn child. Talk to your health care professional or pharmacist for more information. Do not breast-feed an infant while taking this medicine. Men should inform their doctor if they wish to father a child. This medicine may lower sperm counts. Do not treat diarrhea with over the counter products. Contact your doctor if you have diarrhea that lasts more than 2 days or if it is severe and watery. This medicine can make you more sensitive to the sun. Keep out of the sun. If you cannot avoid being in the sun, wear protective clothing and use sunscreen. Do not use sun lamps or tanning beds/booths. What side effects may I notice from receiving this medicine? Side effects that you should report to your doctor or health care professional as soon as possible: -allergic reactions like skin rash, itching or hives, swelling of the face, lips, or tongue -low blood counts - this medicine may decrease the number of white blood cells, red blood cells and platelets. You may be at increased risk for infections and bleeding. -signs of infection - fever or chills, cough, sore throat, pain or difficulty passing urine -signs of decreased platelets or bleeding - bruising, pinpoint red spots on the skin, black, tarry stools, blood in the urine -signs of decreased red blood cells - unusually weak or tired, fainting spells, lightheadedness -breathing problems -changes in vision -chest pain -mouth sores -nausea and vomiting -pain, swelling, redness at site where injected -pain, tingling, numbness in the hands or feet -redness, swelling, or sores on hands or feet -stomach pain -unusual bleeding Side effects that usually do not require medical attention (report to your doctor or health care professional if they continue or are bothersome): -changes in finger or  toe nails -diarrhea -dry or itchy skin -hair loss -headache -loss of appetite -sensitivity of eyes to the light -stomach upset -unusually teary eyes This list may not describe all possible side effects. Call your doctor for medical advice about side effects. You may report side effects to FDA at 1-800-FDA-1088. Where should I keep my medicine? This drug is given in a hospital or clinic and will not be stored at home. NOTE: This sheet is a summary. It may not cover all possible information. If you have questions about this medicine, talk to your doctor, pharmacist, or health care provider.  2015, Elsevier/Gold Standard. (2008-04-08 13:53:16) Implanted [facility] Guide An implanted port is a type of central line that is placed under the skin. Central lines are used to provide IV access when treatment or nutrition needs to be given through a person's veins. Implanted ports are used for long-term IV access. An implanted port may be placed because:   You need IV medicine that would be irritating to the small veins in your hands or arms.   You need long-term IV medicines, such as antibiotics.   You need IV nutrition for a long period.   You need frequent blood draws for lab tests.   You need dialysis.  Implanted ports are usually placed in the chest area, but they can also be placed in the upper arm, the abdomen, or the leg. An implanted port has two main parts:   Reservoir. The reservoir is round and will appear as a small, raised area under your skin. The reservoir is the part where a needle is inserted to give  medicines or draw blood.   Catheter. The catheter is a thin, flexible tube that extends from the reservoir. The catheter is placed into a large vein. Medicine that is inserted into the reservoir goes into the catheter and then into the vein.  HOW WILL I CARE FOR MY INCISION SITE? Do not get the incision site wet. Bathe or shower as directed by your health care provider.    HOW IS MY PORT ACCESSED? Special steps must be taken to access the port:   Before the port is accessed, a numbing cream can be placed on the skin. This helps numb the skin over the port site.   Your health care provider uses a sterile technique to access the port.  Your health care provider must put on a mask and sterile gloves.  The skin over your port is cleaned carefully with an antiseptic and allowed to dry.  The port is gently pinched between sterile gloves, and a needle is inserted into the port.  Only "non-coring" port needles should be used to access the port. Once the port is accessed, a blood return should be checked. This helps ensure that the port is in the vein and is not clogged.   If your port needs to remain accessed for a constant infusion, a clear (transparent) bandage will be placed over the needle site. The bandage and needle will need to be changed every week, or as directed by your health care provider.   Keep the bandage covering the needle clean and dry. Do not get it wet. Follow your health care provider's instructions on how to take a shower or bath while the port is accessed.   If your port does not need to stay accessed, no bandage is needed over the port.  WHAT IS FLUSHING? Flushing helps keep the port from getting clogged. Follow your health care provider's instructions on how and when to flush the port. Ports are usually flushed with saline solution or a medicine called heparin. The need for flushing will depend on how the port is used.   If the port is used for intermittent medicines or blood draws, the port will need to be flushed:   After medicines have been given.   After blood has been drawn.   As part of routine maintenance.   If a constant infusion is running, the port may not need to be flushed.  HOW LONG WILL MY PORT STAY IMPLANTED? The port can stay in for as long as your health care provider thinks it is needed. When it is time  for the port to come out, surgery will be done to remove it. The procedure is similar to the one performed when the port was put in.  WHEN SHOULD I SEEK IMMEDIATE MEDICAL CARE? When you have an implanted port, you should seek immediate medical care if:   You notice a bad smell coming from the incision site.   You have swelling, redness, or drainage at the incision site.   You have more swelling or pain at the port site or the surrounding area.   You have a fever that is not controlled with medicine. Document Released: 12/04/2005 Document Revised: 09/24/2013 Document Reviewed: 08/11/2013 [facility] Patient Information 2015 [place], [place]. This information is not intended to replace advice given to you by your health care provider. Make sure you discuss any questions you have with your health care provider. Pegfilgrastim injection What is this medicine? PEGFILGRASTIM (peg fil GRA stim) is a long-acting granulocyte  colony-stimulating factor that stimulates the growth of neutrophils, a type of white blood cell important in the body's fight against infection. It is used to reduce the incidence of fever and infection in patients with certain types of cancer who are receiving chemotherapy that affects the bone marrow. This medicine may be used for other purposes; ask your health care provider or pharmacist if you have questions. COMMON BRAND NAME(S): Neulasta What should I tell my health care provider before I take this medicine? They need to know if you have any of these conditions: -latex allergy -ongoing radiation therapy -sickle cell disease -skin reactions to acrylic adhesives (On-Body Injector only) -an unusual or allergic reaction to pegfilgrastim, filgrastim, other medicines, foods, dyes, or preservatives -pregnant or trying to get pregnant -breast-feeding How should I use this medicine? This medicine is for injection under the skin. If you get this medicine at home, you will be taught  how to prepare and give the pre-filled syringe or how to use the On-body Injector. Refer to the patient Instructions for Use for detailed instructions. Use exactly as directed. Take your medicine at regular intervals. Do not take your medicine more often than directed. It is important that you put your used needles and syringes in a special sharps container. Do not put them in a trash can. If you do not have a sharps container, call your pharmacist or healthcare provider to get one. Talk to your pediatrician regarding the use of this medicine in children. Special care may be needed. Overdosage: If you think you have taken too much of this medicine contact a poison control center or emergency room at once. NOTE: This medicine is only for you. Do not share this medicine with others. What if I miss a dose? It is important not to miss your dose. Call your doctor or health care professional if you miss your dose. If you miss a dose due to an On-body Injector failure or leakage, a new dose should be administered as soon as possible using a single prefilled syringe for manual use. What may interact with this medicine? Interactions have not been studied. Give your health care provider a list of all the medicines, herbs, non-prescription drugs, or dietary supplements you use. Also tell them if you smoke, drink alcohol, or use illegal drugs. Some items may interact with your medicine. This list may not describe all possible interactions. Give your health care provider a list of all the medicines, herbs, non-prescription drugs, or dietary supplements you use. Also tell them if you smoke, drink alcohol, or use illegal drugs. Some items may interact with your medicine. What should I watch for while using this medicine? You may need blood work done while you are taking this medicine. If you are going to need a MRI, CT scan, or other procedure, tell your doctor that you are using this medicine (On-Body Injector  only). What side effects may I notice from receiving this medicine? Side effects that you should report to your doctor or health care professional as soon as possible: -allergic reactions like skin rash, itching or hives, swelling of the face, lips, or tongue -dizziness -fever -pain, redness, or irritation at site where injected -pinpoint red spots on the skin -shortness of breath or breathing problems -stomach or side pain, or pain at the shoulder -swelling -tiredness -trouble passing urine Side effects that usually do not require medical attention (report to your doctor or health care professional if they continue or are bothersome): -bone pain -muscle pain  This list may not describe all possible side effects. Call your doctor for medical advice about side effects. You may report side effects to FDA at 1-800-FDA-1088. Where should I keep my medicine? Keep out of the reach of children. Store pre-filled syringes in a refrigerator between 2 and 8 degrees C (36 and 46 degrees F). Do not freeze. Keep in carton to protect from light. Throw away this medicine if it is left out of the refrigerator for more than 48 hours. Throw away any unused medicine after the expiration date. NOTE: This sheet is a summary. It may not cover all possible information. If you have questions about this medicine, talk to your doctor, pharmacist, or health care provider.  2015, Elsevier/Gold Standard. (2014-03-05 16:14:05)

## 2015-03-08 ENCOUNTER — Ambulatory Visit (HOSPITAL_BASED_OUTPATIENT_CLINIC_OR_DEPARTMENT_OTHER): Payer: Medicaid Other

## 2015-03-08 ENCOUNTER — Encounter: Payer: Self-pay | Admitting: Family

## 2015-03-08 ENCOUNTER — Ambulatory Visit (HOSPITAL_BASED_OUTPATIENT_CLINIC_OR_DEPARTMENT_OTHER): Payer: Medicaid Other | Admitting: Family

## 2015-03-08 ENCOUNTER — Other Ambulatory Visit (HOSPITAL_BASED_OUTPATIENT_CLINIC_OR_DEPARTMENT_OTHER): Payer: Medicaid Other | Admitting: Lab

## 2015-03-08 DIAGNOSIS — C329 Malignant neoplasm of larynx, unspecified: Secondary | ICD-10-CM

## 2015-03-08 DIAGNOSIS — D509 Iron deficiency anemia, unspecified: Secondary | ICD-10-CM

## 2015-03-08 DIAGNOSIS — C321 Malignant neoplasm of supraglottis: Secondary | ICD-10-CM

## 2015-03-08 DIAGNOSIS — D501 Sideropenic dysphagia: Secondary | ICD-10-CM

## 2015-03-08 DIAGNOSIS — Z5111 Encounter for antineoplastic chemotherapy: Secondary | ICD-10-CM

## 2015-03-08 LAB — CBC WITH DIFFERENTIAL (CANCER CENTER ONLY)
BASO#: 0.1 10*3/uL (ref 0.0–0.2)
BASO%: 0.9 % (ref 0.0–2.0)
EOS%: 1.1 % (ref 0.0–7.0)
Eosinophils Absolute: 0.1 10*3/uL (ref 0.0–0.5)
HCT: 33.4 % — ABNORMAL LOW (ref 34.8–46.6)
HGB: 10.4 g/dL — ABNORMAL LOW (ref 11.6–15.9)
LYMPH#: 2.4 10*3/uL (ref 0.9–3.3)
LYMPH%: 29.7 % (ref 14.0–48.0)
MCH: 26.2 pg (ref 26.0–34.0)
MCHC: 31.1 g/dL — AB (ref 32.0–36.0)
MCV: 84 fL (ref 81–101)
MONO#: 1.2 10*3/uL — AB (ref 0.1–0.9)
MONO%: 14.5 % — AB (ref 0.0–13.0)
NEUT#: 4.3 10*3/uL (ref 1.5–6.5)
NEUT%: 53.8 % (ref 39.6–80.0)
PLATELETS: 305 10*3/uL (ref 145–400)
RBC: 3.97 10*6/uL (ref 3.70–5.32)
RDW: 29.7 % — AB (ref 11.1–15.7)
WBC: 8 10*3/uL (ref 3.9–10.0)

## 2015-03-08 LAB — CMP (CANCER CENTER ONLY)
ALT(SGPT): 19 U/L (ref 10–47)
AST: 28 U/L (ref 11–38)
Albumin: 3.4 g/dL (ref 3.3–5.5)
Alkaline Phosphatase: 95 U/L — ABNORMAL HIGH (ref 26–84)
BUN, Bld: 7 mg/dL (ref 7–22)
CALCIUM: 9.6 mg/dL (ref 8.0–10.3)
CO2: 30 mEq/L (ref 18–33)
Chloride: 102 mEq/L (ref 98–108)
Creat: 0.6 mg/dl (ref 0.6–1.2)
GLUCOSE: 102 mg/dL (ref 73–118)
POTASSIUM: 3.9 meq/L (ref 3.3–4.7)
SODIUM: 140 meq/L (ref 128–145)
Total Bilirubin: 0.5 mg/dl (ref 0.20–1.60)
Total Protein: 7.3 g/dL (ref 6.4–8.1)

## 2015-03-08 LAB — IRON AND TIBC CHCC
%SAT: 20 % — ABNORMAL LOW (ref 21–57)
Iron: 57 ug/dL (ref 41–142)
TIBC: 276 ug/dL (ref 236–444)
UIBC: 220 ug/dL (ref 120–384)

## 2015-03-08 LAB — MAGNESIUM: MAGNESIUM: 2 mg/dL (ref 1.5–2.5)

## 2015-03-08 LAB — FERRITIN CHCC: FERRITIN: 196 ng/mL (ref 9–269)

## 2015-03-08 MED ORDER — POTASSIUM CHLORIDE 2 MEQ/ML IV SOLN
Freq: Once | INTRAVENOUS | Status: AC
  Administered 2015-03-08: 10:00:00 via INTRAVENOUS
  Filled 2015-03-08: qty 10

## 2015-03-08 MED ORDER — FOSAPREPITANT DIMEGLUMINE INJECTION 150 MG
Freq: Once | INTRAVENOUS | Status: AC
  Administered 2015-03-08: 11:00:00 via INTRAVENOUS
  Filled 2015-03-08: qty 5

## 2015-03-08 MED ORDER — SODIUM CHLORIDE 0.9 % IV SOLN
600.0000 mg/m2/d | INTRAVENOUS | Status: DC
  Administered 2015-03-08: 3850 mg via INTRAVENOUS
  Filled 2015-03-08: qty 77

## 2015-03-08 MED ORDER — PALONOSETRON HCL INJECTION 0.25 MG/5ML
INTRAVENOUS | Status: AC
  Filled 2015-03-08: qty 5

## 2015-03-08 MED ORDER — SODIUM CHLORIDE 0.9 % IV SOLN
60.0000 mg/m2 | Freq: Once | INTRAVENOUS | Status: AC
  Administered 2015-03-08: 96 mg via INTRAVENOUS
  Filled 2015-03-08: qty 96

## 2015-03-08 MED ORDER — DEXTROSE 5 % IV SOLN
60.0000 mg/m2 | Freq: Once | INTRAVENOUS | Status: AC
  Administered 2015-03-08: 100 mg via INTRAVENOUS
  Filled 2015-03-08: qty 10

## 2015-03-08 MED ORDER — SODIUM CHLORIDE 0.9 % IV SOLN
Freq: Once | INTRAVENOUS | Status: AC
  Administered 2015-03-08: 10:00:00 via INTRAVENOUS

## 2015-03-08 MED ORDER — PALONOSETRON HCL INJECTION 0.25 MG/5ML
0.2500 mg | Freq: Once | INTRAVENOUS | Status: AC
  Administered 2015-03-08: 0.25 mg via INTRAVENOUS

## 2015-03-08 NOTE — Progress Notes (Signed)
Hematology and Oncology Follow Up Visit  [name] [id number] Apr 29, 1966 48 y.o. 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 She is feeling much better. Her appetite has increased and her weight is stable. She is staying hydrated. She is exercising daily.  She denies fever, vomiting, chills, cough, mouth sores, rash, dizziness, SOB, chest pain, palpitations, abdominal pain, constipation, diarrhea, blood in urine or stool. She still has headaches at times. She has had a little nausea but this has improved with antiemetics.  Her neck lymphadenopathy has resolved.  No swelling, tenderness, numbness or tingling in her extremities. No new aches or pains.  She is still living with her aunt.   Medications:    Medication List       This list is accurate as of: 03/08/15  8:51 AM.  Always use your most recent med list.               acyclovir 400 MG tablet  Commonly known as:  ZOVIRAX  Take 2 tablets (800 mg total) by mouth 2 (two) times daily.     dexamethasone 4 MG tablet  Commonly known as:  DECADRON  Take 2 tablets (8 mg total) by mouth 2 (two) times daily. Start the day before Taxotere. Take once the day after, then 2 times a day x 2d.     ENSURE COMPLETE SHAKE Liqd  Take 1 Bottle by mouth 2 (two) times daily.     fentaNYL 25 MCG/HR patch  Commonly known as:  DURAGESIC - dosed mcg/hr  Place 1 patch (25 mcg total) onto the skin every 3 (three) days.     fluconazole 200 MG tablet  Commonly known as:  DIFLUCAN  Take 1 tablet (200 mg total) by mouth daily.     HYDROcodone-acetaminophen 10-325 MG per tablet  Commonly known as:  NORCO  Take 1 tablet by mouth every 6 (six) hours as needed for moderate pain.     HYDROmorphone  4 MG tablet  Commonly known as:  DILAUDID  Take 4 mg by mouth every 4 (four) hours as needed for severe pain.     ibuprofen 200 MG tablet  Commonly known as:  ADVIL,MOTRIN  Take 400-600 mg by mouth at bedtime.     lidocaine 2 % solution  Commonly known as:  XYLOCAINE  Use as directed 10 mLs in the mouth or throat every 4 (four) hours as needed for mouth pain. Swish & swallow     lidocaine-prilocaine cream  Commonly known as:  EMLA  Apply to affected area once     LORazepam 0.5 MG tablet  Commonly known as:  ATIVAN  Take 1 tablet (0.5 mg total) by mouth every 6 (six) hours as needed (Nausea or vomiting).     magic mouthwash w/lidocaine Soln  Take 5 mLs by mouth 3 (three) times daily.     ondansetron 4 MG tablet  Commonly known as:  ZOFRAN  Take 1 tablet (4 mg total) by mouth every 6 (six) hours.     ondansetron 8 MG tablet  Commonly known as:  ZOFRAN  Take 1 tablet (8 mg total) by mouth 2 (two) times daily as needed (Nausea or vomiting). Begin 4 days after chemotherapy.  oxyCODONE 5 MG immediate release tablet  Commonly known as:  Oxy IR/ROXICODONE  Take 5 mg by mouth every 6 (six) hours as needed for severe pain.     prochlorperazine 10 MG tablet  Commonly known as:  COMPAZINE  Take 1 tablet (10 mg total) by mouth every 6 (six) hours as needed (Nausea or vomiting).     senna 8.6 MG tablet  Commonly known as:  SENOKOT  Take 2 tablets by mouth at bedtime.     varenicline 0.5 MG tablet  Commonly known as:  CHANTIX  Take 1 tablet (0.5 mg total) by mouth 2 (two) times daily. Take one tablet daily for days 1-3. Then one tablet two times per day for days 4-7. Then start two tablets two times per day for remainder of therapy.        Allergies:  Allergies  Allergen Reactions  . Ancef [Cefazolin] Hives  . Ciprofloxacin     hives    Past Medical History, Surgical history, Social history, and Family History were reviewed and updated.  Review of Systems: All other  10 point review of systems is negative.   Physical Exam:  vitals were not taken for this visit.  Wt Readings from Last 3 Encounters:  02/15/15 123 lb (55.792 kg)  02/04/15 121 lb (54.885 kg)  02/02/15 121 lb (54.885 kg)    Ocular: Sclerae unicteric, pupils equal, round and reactive to light Ear-nose-throat: Oropharynx clear, dentition fair Lymphatic: No cervical or supraclavicular adenopathy Lungs no rales or rhonchi, good excursion bilaterally Heart regular rate and rhythm, no murmur appreciated Abd soft, nontender, positive bowel sounds MSK no focal spinal tenderness, no joint edema Neuro: non-focal, well-oriented, appropriate affect Breasts: Deferred  Lab Results  Component Value Date   WBC 8.0 03/08/2015   HGB 10.4* 03/08/2015   HCT 33.4* 03/08/2015   MCV 84 03/08/2015   PLT 305 03/08/2015   No results found for: FERRITIN, IRON, TIBC, UIBC, IRONPCTSAT Lab Results  Component Value Date   RBC 3.97 03/08/2015   No results found for: KPAFRELGTCHN, LAMBDASER, KAPLAMBRATIO No results found for: IGGSERUM, IGA, IGMSERUM No results found for: Odetta Pink, SPEI   Chemistry      Component Value Date/Time   NA 136 02/15/2015 0837   NA 136 02/04/2015 1116   K 3.9 02/15/2015 0837   K 3.2* 02/04/2015 1116   CL 98 02/15/2015 0837   CL 102 02/04/2015 1116   CO2 27 02/15/2015 0837   CO2 25 02/04/2015 1116   BUN 6* 02/15/2015 0837   BUN 7 02/04/2015 1116   CREATININE 0.6 02/15/2015 0837   CREATININE 0.63 02/04/2015 1116      Component Value Date/Time   CALCIUM 9.3 02/15/2015 0837   CALCIUM 8.2* 02/04/2015 1116   ALKPHOS 152* 02/15/2015 0837   ALKPHOS 126* 02/04/2015 1116   AST 20 02/15/2015 0837   AST 15 02/04/2015 1116   ALT 16 02/15/2015 0837   ALT 11 02/04/2015 1116   BILITOT 0.40 02/15/2015 0837   BILITOT 0.2 02/04/2015 1116     Impression and Plan: [name] is a very pleasant 49 yo white female locally  advanced squamous carcinoma of the right false vocal cord. She is able to eat, exercise and is gaining back some weight. Her nausea had improved and is controlled with antiemetics.  We will proceed with cycle 3 of DC 5FU today. We will schedule her for a repeat CT of the neck next week.  We will give her a new appointment schedule today.  She knows to call here with any questions or concerns and to go to the ED in the event of an emergency. We can certainly see her sooner if ned be.   Eliezer Bottom, NP 3/21/20168:51 AM

## 2015-03-08 NOTE — Patient Instructions (Signed)
[place] Discharge Instructions for Patients Receiving Chemotherapy  Today you received the following chemotherapy agents 5FU, Taxotere and Cisplatin.   To help prevent nausea and vomiting after your treatment, we encourage you to take your nausea medication.   If you develop nausea and vomiting that is not controlled by your nausea medication, call the clinic.   BELOW ARE SYMPTOMS THAT SHOULD BE REPORTED IMMEDIATELY:  *FEVER GREATER THAN 100.5 F  *CHILLS WITH OR WITHOUT FEVER  NAUSEA AND VOMITING THAT IS NOT CONTROLLED WITH YOUR NAUSEA MEDICATION  *UNUSUAL SHORTNESS OF BREATH  *UNUSUAL BRUISING OR BLEEDING  TENDERNESS IN MOUTH AND THROAT WITH OR WITHOUT PRESENCE OF ULCERS  *URINARY PROBLEMS  *BOWEL PROBLEMS  UNUSUAL RASH Items with * indicate a potential emergency and should be followed up as soon as possible.  Feel free to call the clinic you have any questions or concerns. The clinic phone number is [phone].  Please show the [facility] at check-in to the Emergency Department and triage nurse.

## 2015-03-12 ENCOUNTER — Ambulatory Visit (HOSPITAL_BASED_OUTPATIENT_CLINIC_OR_DEPARTMENT_OTHER): Payer: Medicaid Other

## 2015-03-12 ENCOUNTER — Telehealth: Payer: Self-pay | Admitting: Hematology & Oncology

## 2015-03-12 DIAGNOSIS — C329 Malignant neoplasm of larynx, unspecified: Secondary | ICD-10-CM

## 2015-03-12 DIAGNOSIS — Z452 Encounter for adjustment and management of vascular access device: Secondary | ICD-10-CM

## 2015-03-12 DIAGNOSIS — C321 Malignant neoplasm of supraglottis: Secondary | ICD-10-CM

## 2015-03-12 MED ORDER — HEPARIN SOD (PORK) LOCK FLUSH 100 UNIT/ML IV SOLN
500.0000 [IU] | Freq: Once | INTRAVENOUS | Status: AC | PRN
  Administered 2015-03-12: 500 [IU]
  Filled 2015-03-12: qty 5

## 2015-03-12 MED ORDER — SODIUM CHLORIDE 0.9 % IJ SOLN
10.0000 mL | INTRAMUSCULAR | Status: DC | PRN
  Administered 2015-03-12: 10 mL
  Filled 2015-03-12: qty 10

## 2015-03-12 MED ORDER — HEPARIN SOD (PORK) LOCK FLUSH 100 UNIT/ML IV SOLN
250.0000 [IU] | Freq: Once | INTRAVENOUS | Status: DC | PRN
  Filled 2015-03-12: qty 5

## 2015-03-12 NOTE — Telephone Encounter (Signed)
Received call from radiology pt cx 3-28 CT. Pt was here for appointment and I asked her if she wanted me to reschedule it for this week, she said no that she would call herself. When she came in she said she felt really bad. RN is aware of all the above.

## 2015-03-12 NOTE — Telephone Encounter (Signed)
na

## 2015-03-12 NOTE — Progress Notes (Signed)
Per Dr. Cherlynn Polo no Neulasta needed.

## 2015-03-15 ENCOUNTER — Other Ambulatory Visit: Payer: Self-pay | Admitting: Nurse Practitioner

## 2015-03-15 ENCOUNTER — Ambulatory Visit (HOSPITAL_BASED_OUTPATIENT_CLINIC_OR_DEPARTMENT_OTHER): Payer: Medicaid Other

## 2015-03-15 DIAGNOSIS — C329 Malignant neoplasm of larynx, unspecified: Secondary | ICD-10-CM

## 2015-03-15 MED ORDER — LIDOCAINE VISCOUS 2 % MT SOLN: 10.0000 mL | OROMUCOSAL | Status: DC | PRN

## 2015-03-16 ENCOUNTER — Encounter (HOSPITAL_COMMUNITY): Payer: Self-pay | Admitting: Emergency Medicine

## 2015-03-16 ENCOUNTER — Emergency Department (HOSPITAL_COMMUNITY): Payer: Medicaid Other

## 2015-03-16 ENCOUNTER — Emergency Department (HOSPITAL_COMMUNITY)
Admission: EM | Admit: 2015-03-16 | Discharge: 2015-03-16 | Disposition: A | Discharge status: Home / Self Care | Payer: Medicaid Other | Attending: Emergency Medicine | Admitting: Emergency Medicine

## 2015-03-16 DIAGNOSIS — C329 Malignant neoplasm of larynx, unspecified: Secondary | ICD-10-CM | POA: Insufficient documentation

## 2015-03-16 DIAGNOSIS — R1310 Dysphagia, unspecified: Secondary | ICD-10-CM

## 2015-03-16 DIAGNOSIS — Z9221 Personal history of antineoplastic chemotherapy: Secondary | ICD-10-CM | POA: Insufficient documentation

## 2015-03-16 DIAGNOSIS — Z79899 Other long term (current) drug therapy: Secondary | ICD-10-CM | POA: Diagnosis not present

## 2015-03-16 DIAGNOSIS — Z21 Asymptomatic human immunodeficiency virus [HIV] infection status: Secondary | ICD-10-CM | POA: Insufficient documentation

## 2015-03-16 DIAGNOSIS — K1379 Other lesions of oral mucosa: Secondary | ICD-10-CM | POA: Diagnosis present

## 2015-03-16 DIAGNOSIS — Z87891 Personal history of nicotine dependence: Secondary | ICD-10-CM | POA: Diagnosis not present

## 2015-03-16 LAB — BASIC METABOLIC PANEL
Anion gap: 7 (ref 5–15)
BUN: 7 mg/dL (ref 6–23)
CO2: 26 mmol/L (ref 19–32)
Calcium: 9 mg/dL (ref 8.4–10.5)
Chloride: 99 mmol/L (ref 96–112)
Creatinine, Ser: 0.55 mg/dL (ref 0.50–1.10)
GFR calc Af Amer: >90 mL/min (ref >90)
Glucose, Bld: 110 mg/dL — ABNORMAL HIGH (ref 70–99)
Potassium: 3.8 mmol/L (ref 3.5–5.1)
Sodium: 132 mmol/L — ABNORMAL LOW (ref 135–145)

## 2015-03-16 LAB — CBC WITH DIFFERENTIAL/PLATELET
BASOS ABS: 0 10*3/uL (ref 0.0–0.1)
Basophils Relative: 1 % (ref 0–1)
Eosinophils Absolute: 0.1 10*3/uL (ref 0.0–0.7)
Eosinophils Relative: 2 % (ref 0–5)
HCT: 28 % — ABNORMAL LOW (ref 36.0–46.0)
Hemoglobin: 8.9 g/dL — ABNORMAL LOW (ref 12.0–15.0)
LYMPHS PCT: 58 % — AB (ref 12–46)
Lymphs Abs: 1.7 10*3/uL (ref 0.7–4.0)
MCH: 26.1 pg (ref 26.0–34.0)
MCHC: 31.8 g/dL (ref 30.0–36.0)
MCV: 82.1 fL (ref 78.0–100.0)
Monocytes Absolute: 0.2 10*3/uL (ref 0.1–1.0)
Monocytes Relative: 8 % (ref 3–12)
NEUTROS ABS: 0.9 10*3/uL — AB (ref 1.7–7.7)
Neutrophils Relative %: 31 % — ABNORMAL LOW (ref 43–77)
Platelets: 164 10*3/uL (ref 150–400)
RBC: 3.41 MIL/uL — ABNORMAL LOW (ref 3.87–5.11)
RDW: 26.6 % — ABNORMAL HIGH (ref 11.5–15.5)
WBC: 2.9 10*3/uL — ABNORMAL LOW (ref 4.0–10.5)

## 2015-03-16 LAB — PATHOLOGIST SMEAR REVIEW

## 2015-03-16 MED ORDER — HYDROMORPHONE HCL 1 MG/ML IJ SOLN
2.0000 mg | Freq: Once | INTRAMUSCULAR | Status: AC
  Administered 2015-03-16: 2 mg via INTRAVENOUS
  Filled 2015-03-16: qty 2

## 2015-03-16 MED ORDER — LIDOCAINE VISCOUS 2 % MT SOLN
15.0000 mL | Freq: Once | OROMUCOSAL | Status: AC
  Administered 2015-03-16: 15 mL via OROMUCOSAL
  Filled 2015-03-16: qty 15

## 2015-03-16 MED ORDER — OXYCODONE-ACETAMINOPHEN 5-325 MG PO TABS: 1.0000 | ORAL_TABLET | Freq: Three times a day (TID) | ORAL | Status: DC | PRN

## 2015-03-16 MED ORDER — NYSTATIN 100000 UNIT/ML MT SUSP: 500000.0000 [IU] | Freq: Four times a day (QID) | OROMUCOSAL | Status: DC

## 2015-03-16 NOTE — ED Notes (Signed)
Gave pt some water per [employer].

## 2015-03-16 NOTE — ED Notes (Signed)
Pt complained of pain. Informed RN.

## 2015-03-16 NOTE — Discharge Instructions (Signed)
Please take the medicine prescribed. SEE THE PAIN DOCTOR IN 3 DAYS AS PLANNED SEE THE INFECTIOUS DISEASE DOCTOR AS SOON AS POSSIBLE.  Esophagitis Esophagitis is inflammation of the esophagus. It can involve swelling, soreness, and pain in the esophagus. This condition can make it difficult and painful to swallow. CAUSES  Most causes of esophagitis are not serious. Many different factors can cause esophagitis, including:  Gastroesophageal reflux disease (GERD). This is when acid from your stomach flows up into the esophagus.  Recurrent vomiting.  An allergic-type reaction.  Certain medicines, especially those that come in large pills.  Ingestion of harmful chemicals, such as household cleaning products.  Heavy alcohol use.  An infection of the esophagus.  Radiation treatment for cancer.  Certain diseases such as sarcoidosis, Crohn's disease, and scleroderma. These diseases may cause recurrent esophagitis. SYMPTOMS   Trouble swallowing.  Painful swallowing.  Chest pain.  Difficulty breathing.  Nausea.  Vomiting.  Abdominal pain. DIAGNOSIS  Your caregiver will take your history and do a physical exam. Depending upon what your caregiver finds, certain tests may also be done, including:  Barium X-ray. You will drink a solution that coats the esophagus, and X-rays will be taken.  Endoscopy. A lighted tube is put down the esophagus so your caregiver can examine the area.  Allergy tests. These can sometimes be arranged through follow-up visits. TREATMENT  Treatment will depend on the cause of your esophagitis. In some cases, steroids or other medicines may be given to help relieve your symptoms or to treat the underlying cause of your condition. Medicines that may be recommended include:  Viscous lidocaine, to soothe the esophagus.  Antacids.  Acid reducers.  Proton pump inhibitors.  Antiviral medicines for certain viral infections of the esophagus.  Antifungal  medicines for certain fungal infections of the esophagus.  Antibiotic medicines, depending on the cause of the esophagitis. HOME CARE INSTRUCTIONS   Avoid foods and drinks that seem to make your symptoms worse.  Eat small, frequent meals instead of large meals.  Avoid eating for the 3 hours prior to your bedtime.  If you have trouble taking pills, use a pill splitter to decrease the size and likelihood of the pill getting stuck or injuring the esophagus on the way down. Drinking water after taking a pill also helps.  Stop smoking if you smoke.  Maintain a healthy weight.  Wear loose-fitting clothing. Do not wear anything tight around your waist that causes pressure on your stomach.  Raise the head of your bed 6 to 8 inches with wood blocks to help you sleep. Extra pillows will not help.  Only take over-the-counter or prescription medicines as directed by your caregiver. SEEK IMMEDIATE MEDICAL CARE IF:  You have severe chest pain that radiates into your arm, neck, or jaw.  You feel sweaty, dizzy, or lightheaded.  You have shortness of breath.  You vomit blood.  You have difficulty or pain with swallowing.  You have bloody or black, tarry stools.  You have a fever.  You have a burning sensation in the chest more than 3 times a week for more than 2 weeks.  You cannot swallow, drink, or eat.  You drool because you cannot swallow your saliva. MAKE SURE YOU:  Understand these instructions.  Will watch your condition.  Will get help right away if you are not doing well or get worse. Document Released: 01/11/2005 Document Revised: 02/26/2012 Document Reviewed: 08/04/2011 [facility] Patient Information 2015 [place], [place]. This information is not intended  to replace advice given to you by your health care provider. Make sure you discuss any questions you have with your health care provider. ° °

## 2015-03-16 NOTE — ED Notes (Addendum)
Pt presents with mouth pain and lesions since Saturday- pt has mouth and throat cancer stage IV and received completed third treatment of DC SFU on Friday.  Airway intact, no respiratory distress noted.

## 2015-03-16 NOTE — ED Provider Notes (Signed)
CSN: [id number]     Arrival date & time 03/16/15  0112 History   None    This chart was scribed for Varney Biles, MD by Forrestine Him, ED Scribe. This patient was seen in room B17C/B17C and the patient's care was started 3:36 AM.   Chief Complaint  Patient presents with  . Mouth Lesions   HPI  HPI Comments: [name] is a 49 y.o. female with a PMHx of HIV and cancer of head and neck who presents to the Emergency Department complaining of constant, ongoing lesions to the mouth and throat pain x 4 days. Pt states ulcers are extremely painful. Discomfort is worsened with swallowing and eating without any alleviating factors. She has tried prescribed lidocaine and chloraseptic rinse with mild temporary improvement for symptoms. Pt is also on 4 mg of Dilaudid at home for break through pain. No recent blood in sputum. [name] is currently undergoing chemotherapy. Last treatment Friday 3/24. No radiation treatment at this time. Pt with known allergies to Ancef and Ciprofloxacin. No known fevers.  She is followed by [facility]. Next follow up Thursday 3/31 Past Medical History  Diagnosis Date  . HIV (human immunodeficiency virus infection)   . Cancer     head and neck   Past Surgical History  Procedure Laterality Date  . Cesarean section     No family history on file. History  Substance Use Topics  . Smoking status: Former Smoker -- 0.50 packs/day for 32 years    Types: Cigarettes    Start date: 01/13/1986    Quit date: 11/26/2014  . Smokeless tobacco: Never Used     Comment: quit smoking December 2015  . Alcohol Use: No   OB History    No data available     Review of Systems  HENT: Positive for mouth sores and sore throat. Negative for drooling.   Respiratory: Negative for cough.   Allergic/Immunologic: Positive for immunocompromised state.  Hematological: Does not bruise/bleed easily.      Allergies  Ancef and Ciprofloxacin  Home Medications   Prior to  Admission medications   Medication Sig Start Date End Date Taking? Authorizing Provider  acyclovir (ZOVIRAX) 400 MG tablet Take 2 tablets (800 mg total) by mouth 2 (two) times daily. 02/01/15  Yes Volanda Napoleon, MD  Alum & Mag Hydroxide-Simeth (MAGIC MOUTHWASH W/LIDOCAINE) SOLN Take 5 mLs by mouth 3 (three) times daily. 01/29/15  Yes Volanda Napoleon, MD  HYDROmorphone (DILAUDID) 4 MG tablet Take 4 mg by mouth every 4 (four) hours as needed for severe pain.   Yes Historical Provider, MD  ondansetron (ZOFRAN) 4 MG tablet Take 1 tablet (4 mg total) by mouth every 6 (six) hours. Patient taking differently: Take 4 mg by mouth every 6 (six) hours. MODERATE TO SEVERE NAUSEA 08/17/14  Yes Domenic Moras, PA-C  ondansetron (ZOFRAN) 8 MG tablet Take 1 tablet (8 mg total) by mouth 2 (two) times daily as needed (Nausea or vomiting). Begin 4 days after chemotherapy. 01/22/15  Yes Volanda Napoleon, MD  prochlorperazine (COMPAZINE) 10 MG tablet Take 1 tablet (10 mg total) by mouth every 6 (six) hours as needed (Nausea or vomiting). 02/04/15  Yes Volanda Napoleon, MD  dexamethasone (DECADRON) 4 MG tablet Take 2 tablets (8 mg total) by mouth 2 (two) times daily. Start the day before Taxotere. Take once the day after, then 2 times a day x 2d. Patient not taking: Reported on 03/16/2015 02/04/15   Rudell Cobb  Ennever, MD  fentaNYL (DURAGESIC - DOSED MCG/HR) 25 MCG/HR patch Place 1 patch (25 mcg total) onto the skin every 3 (three) days. Patient not taking: Reported on 03/16/2015 02/04/15   Eliezer Bottom, NP  fluconazole (DIFLUCAN) 200 MG tablet Take 1 tablet (200 mg total) by mouth daily. Patient not taking: Reported on 03/16/2015 02/01/15   Volanda Napoleon, MD  lidocaine (XYLOCAINE) 2 % solution Use as directed 10 mLs in the mouth or throat every 4 (four) hours as needed for mouth pain. Swish & swallow Patient not taking: Reported on 03/16/2015 03/15/15   Volanda Napoleon, MD  lidocaine-prilocaine (EMLA) cream Apply to affected area  once Patient not taking: Reported on 03/16/2015 01/22/15   Volanda Napoleon, MD  LORazepam (ATIVAN) 0.5 MG tablet Take 1 tablet (0.5 mg total) by mouth every 6 (six) hours as needed (Nausea or vomiting). Patient not taking: Reported on 03/16/2015 02/04/15   Volanda Napoleon, MD  Nutritional Supplements (ENSURE COMPLETE SHAKE) LIQD Take 1 Bottle by mouth 2 (two) times daily. Patient not taking: Reported on 03/16/2015 01/25/15   Volanda Napoleon, MD  nystatin (MYCOSTATIN) 100000 UNIT/ML suspension Take 5 mLs (500,000 Units total) by mouth 4 (four) times daily. 03/16/15   Varney Biles, MD  oxyCODONE-acetaminophen (PERCOCET/ROXICET) 5-325 MG per tablet Take 1 tablet by mouth every 8 (eight) hours as needed for severe pain. 03/16/15   Varney Biles, MD  varenicline (CHANTIX) 0.5 MG tablet Take 1 tablet (0.5 mg total) by mouth 2 (two) times daily. Take one tablet daily for days 1-3. Then one tablet two times per day for days 4-7. Then start two tablets two times per day for remainder of therapy. Patient not taking: Reported on 03/16/2015 02/15/15   Volanda Napoleon, MD   Triage Vitals: BP 99/53 mmHg  Pulse 81  Temp(Src) 98.1 F (36.7 C) (Oral)  Resp 14  Ht 5\' 7"  (1.702 m)  Wt 112 lb 9.6 oz (51.075 kg)  BMI 17.63 kg/m2  SpO2 97%  LMP 02/14/2015   Physical Exam  Constitutional: She is oriented to person, place, and time. She appears well-developed and well-nourished. No distress.  HENT:  Head: Normocephalic and atraumatic.  Mouth/Throat: No oropharyngeal exudate.  Oral ulcers on L and R buccal mucous  No oropharyngeal lesions seen, no candida type lesion No bleeding noted  Eyes: EOM are normal.  Neck: Normal range of motion.  Cardiovascular: Normal rate, regular rhythm and normal heart sounds.   Pulmonary/Chest: Effort normal and breath sounds normal. No stridor.  Abdominal: Soft. She exhibits no distension. There is no tenderness.  Musculoskeletal: Normal range of motion.  Neurological: She is alert  and oriented to person, place, and time.  Skin: Skin is warm and dry.  Psychiatric: She has a normal mood and affect. Judgment normal.  Nursing note and vitals reviewed.   ED Course  Procedures (including critical care time)  \  COORDINATION OF CARE: 6:10 AM-Discussed treatment plan with pt at bedside and pt agreed to plan.     Labs Review Labs Reviewed  CBC WITH DIFFERENTIAL/PLATELET - Abnormal; Notable for the following:    WBC 2.9 (*)    RBC 3.41 (*)    Hemoglobin 8.9 (*)    HCT 28.0 (*)    RDW 26.6 (*)    All other components within normal limits  BASIC METABOLIC PANEL - Abnormal; Notable for the following:    Sodium 132 (*)    Glucose, Bld 110 (*)  All other components within normal limits    Imaging Review Dg Neck Soft Tissue  03/16/2015   CLINICAL DATA:  Acute onset of neck pain and swelling. Initial encounter.  EXAM: NECK SOFT TISSUES - 1+ VIEW  COMPARISON:  CT of the neck performed 12/15/2014  FINDINGS: The nasopharynx, oropharynx and hypopharynx are grossly well-aerated. 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 The proximal trachea is unremarkable. Prevertebral soft tissues are within normal limits.  Slight kyphosis of the lower cervical spine appears to be chronic in nature, with minimal disc space narrowing at C5-C6. No acute osseous abnormalities are seen.  IMPRESSION: Known malignancy of the supraglottic region is partially characterized on radiograph. The soft tissues are otherwise grossly unremarkable.   Electronically Signed   By: Garald Balding M.D.   On: 03/16/2015 04:36     EKG Interpretation None      MDM   Final diagnoses:  Laryngeal cancer  Odynophagia    I personally performed the services described in this documentation, which was scribed in my presence. The recorded information has been reviewed and is accurate.  Pt comes in with cc of throat pain and mouth pain. She has a couple of apthous type  lesions. Hx of HIV, laryngeal CA on chemo. No fevers, only SIRs criteria is low WC. She is not neutropenic. She is immunocompromised due to chemo and due to HIV. Xrays show no gas in the neck - so free air. Pain meds helped, pt wants to go home. Discussed concerns for candida type infection in the esophagus given the sx - pt will get nystatin meds. She has been advised to continue seeing her pain and cancer doctors. She also has been asked to see her ID doctor. Will d/c.  Varney Biles, MD 03/16/15 [phone]

## 2015-03-25 ENCOUNTER — Emergency Department (HOSPITAL_COMMUNITY): Payer: Medicaid Other

## 2015-03-25 ENCOUNTER — Encounter (HOSPITAL_COMMUNITY): Payer: Self-pay | Admitting: Emergency Medicine

## 2015-03-25 ENCOUNTER — Emergency Department (HOSPITAL_COMMUNITY)
Admission: EM | Admit: 2015-03-25 | Discharge: 2015-03-25 | Disposition: A | Discharge status: Home / Self Care | Payer: Medicaid Other | Attending: Emergency Medicine | Admitting: Emergency Medicine

## 2015-03-25 DIAGNOSIS — B20 Human immunodeficiency virus [HIV] disease: Secondary | ICD-10-CM | POA: Diagnosis not present

## 2015-03-25 DIAGNOSIS — Z87891 Personal history of nicotine dependence: Secondary | ICD-10-CM | POA: Diagnosis not present

## 2015-03-25 DIAGNOSIS — Z79899 Other long term (current) drug therapy: Secondary | ICD-10-CM | POA: Insufficient documentation

## 2015-03-25 DIAGNOSIS — Z7952 Long term (current) use of systemic steroids: Secondary | ICD-10-CM | POA: Insufficient documentation

## 2015-03-25 DIAGNOSIS — R51 Headache: Secondary | ICD-10-CM | POA: Diagnosis present

## 2015-03-25 DIAGNOSIS — C329 Malignant neoplasm of larynx, unspecified: Secondary | ICD-10-CM | POA: Insufficient documentation

## 2015-03-25 DIAGNOSIS — R519 Headache, unspecified: Secondary | ICD-10-CM

## 2015-03-25 DIAGNOSIS — Z791 Long term (current) use of non-steroidal anti-inflammatories (NSAID): Secondary | ICD-10-CM | POA: Insufficient documentation

## 2015-03-25 LAB — CBC WITH DIFFERENTIAL/PLATELET
Basophils Absolute: 0 10*3/uL (ref 0.0–0.1)
Basophils Relative: 1 % (ref 0–1)
Eosinophils Absolute: 0 10*3/uL (ref 0.0–0.7)
Eosinophils Relative: 1 % (ref 0–5)
HEMATOCRIT: 30.5 % — AB (ref 36.0–46.0)
Hemoglobin: 9.6 g/dL — ABNORMAL LOW (ref 12.0–15.0)
LYMPHS ABS: 3.1 10*3/uL (ref 0.7–4.0)
LYMPHS PCT: 69 % — AB (ref 12–46)
MCH: 26.9 pg (ref 26.0–34.0)
MCHC: 31.5 g/dL (ref 30.0–36.0)
MCV: 85.4 fL (ref 78.0–100.0)
Monocytes Absolute: 0.9 10*3/uL (ref 0.1–1.0)
Monocytes Relative: 22 % — ABNORMAL HIGH (ref 3–12)
NEUTROS PCT: 7 % — AB (ref 43–77)
Neutro Abs: 0.3 10*3/uL — ABNORMAL LOW (ref 1.7–7.7)
Platelets: 241 10*3/uL (ref 150–400)
RBC: 3.57 MIL/uL — ABNORMAL LOW (ref 3.87–5.11)
RDW: 24.5 % — AB (ref 11.5–15.5)
WBC: 4.3 10*3/uL (ref 4.0–10.5)

## 2015-03-25 LAB — BASIC METABOLIC PANEL
ANION GAP: 10 (ref 5–15)
BUN: 7 mg/dL (ref 6–23)
CALCIUM: 8.6 mg/dL (ref 8.4–10.5)
CO2: 24 mmol/L (ref 19–32)
Chloride: 101 mmol/L (ref 96–112)
Creatinine, Ser: 0.58 mg/dL (ref 0.50–1.10)
GFR calc non Af Amer: >90 mL/min (ref >90)
Glucose, Bld: 106 mg/dL — ABNORMAL HIGH (ref 70–99)
Potassium: 3.8 mmol/L (ref 3.5–5.1)
Sodium: 135 mmol/L (ref 135–145)

## 2015-03-25 MED ORDER — ONDANSETRON HCL 4 MG/2ML IJ SOLN
4.0000 mg | Freq: Once | INTRAMUSCULAR | Status: AC
  Administered 2015-03-25: 4 mg via INTRAVENOUS
  Filled 2015-03-25: qty 2

## 2015-03-25 MED ORDER — SODIUM CHLORIDE 0.9 % IV BOLUS (SEPSIS)
1000.0000 mL | Freq: Once | INTRAVENOUS | Status: AC
  Administered 2015-03-25: 1000 mL via INTRAVENOUS

## 2015-03-25 MED ORDER — PROCHLORPERAZINE EDISYLATE 5 MG/ML IJ SOLN
10.0000 mg | Freq: Four times a day (QID) | INTRAMUSCULAR | Status: DC | PRN
  Administered 2015-03-25: 10 mg via INTRAVENOUS
  Filled 2015-03-25: qty 2

## 2015-03-25 MED ORDER — NAPROXEN 500 MG PO TABS: 500.0000 mg | ORAL_TABLET | Freq: Two times a day (BID) | ORAL | Status: DC

## 2015-03-25 MED ORDER — HYDROMORPHONE HCL 1 MG/ML IJ SOLN
1.0000 mg | Freq: Once | INTRAMUSCULAR | Status: AC
  Administered 2015-03-25: 1 mg via INTRAVENOUS
  Filled 2015-03-25: qty 1

## 2015-03-25 NOTE — ED Notes (Addendum)
Dr Venora Maples notified of BP 90/44, ordered to give bolus. Pt alert and oriented x 4 but drowsy

## 2015-03-25 NOTE — ED Notes (Signed)
Pt c/o migraine headache x's 1 week.  Pt is currently taking Chemo.  Pt st's she feels like her cognition is slower than usual. Nausea without vomting

## 2015-03-25 NOTE — Discharge Instructions (Signed)

## 2015-03-25 NOTE — ED Provider Notes (Signed)
CSN: [id number]     Arrival date & time 03/25/15  1705 History   First MD Initiated Contact with Patient 03/25/15 1842     Chief Complaint  Patient presents with  . Migraine      HPI Patient has a history of laryngeal carcinoma.  She is currently receiving chemotherapy.  She reports worsening headache over the past week and some mild gait instability.  No significant history of headaches before in the past.  No recent injury or trauma.  Denies nausea vomiting or diarrhea.  No other complaints.  Attempted anti-inflammatories without improvement.  Past Medical History  Diagnosis Date  . HIV (human immunodeficiency virus infection)   . Cancer     head and neck   Past Surgical History  Procedure Laterality Date  . Cesarean section     No family history on file. History  Substance Use Topics  . Smoking status: Former Smoker -- 0.50 packs/day for 32 years    Types: Cigarettes    Start date: 01/13/1986    Quit date: 11/26/2014  . Smokeless tobacco: Never Used     Comment: quit smoking December 2015  . Alcohol Use: No   OB History    No data available     Review of Systems  All other systems reviewed and are negative.     Allergies  Ancef and Ciprofloxacin  Home Medications   Prior to Admission medications   Medication Sig Start Date End Date Taking? Authorizing Provider  acyclovir (ZOVIRAX) 400 MG tablet Take 2 tablets (800 mg total) by mouth 2 (two) times daily. 02/01/15  Yes Volanda Napoleon, MD  HYDROcodone-acetaminophen (NORCO) 10-325 MG per tablet Take 1 tablet by mouth every 6 (six) hours as needed. 03/18/15  Yes Historical Provider, MD  Alum & Mag Hydroxide-Simeth (MAGIC MOUTHWASH W/LIDOCAINE) SOLN Take 5 mLs by mouth 3 (three) times daily. Patient not taking: Reported on 03/25/2015 01/29/15   Volanda Napoleon, MD  dexamethasone (DECADRON) 4 MG tablet Take 2 tablets (8 mg total) by mouth 2 (two) times daily. Start the day before Taxotere. Take once the day after, then 2  times a day x 2d. Patient not taking: Reported on 03/16/2015 02/04/15   Volanda Napoleon, MD  fentaNYL (DURAGESIC - DOSED MCG/HR) 25 MCG/HR patch Place 1 patch (25 mcg total) onto the skin every 3 (three) days. Patient not taking: Reported on 03/16/2015 02/04/15   Eliezer Bottom, NP  fluconazole (DIFLUCAN) 200 MG tablet Take 1 tablet (200 mg total) by mouth daily. Patient not taking: Reported on 03/16/2015 02/01/15   Volanda Napoleon, MD  lidocaine (XYLOCAINE) 2 % solution Use as directed 10 mLs in the mouth or throat every 4 (four) hours as needed for mouth pain. Swish & swallow Patient not taking: Reported on 03/16/2015 03/15/15   Volanda Napoleon, MD  lidocaine-prilocaine (EMLA) cream Apply to affected area once Patient not taking: Reported on 03/16/2015 01/22/15   Volanda Napoleon, MD  LORazepam (ATIVAN) 0.5 MG tablet Take 1 tablet (0.5 mg total) by mouth every 6 (six) hours as needed (Nausea or vomiting). Patient not taking: Reported on 03/16/2015 02/04/15   Volanda Napoleon, MD  naproxen (NAPROSYN) 500 MG tablet Take 1 tablet (500 mg total) by mouth 2 (two) times daily. 03/25/15   Jola Schmidt, MD  Nutritional Supplements (ENSURE COMPLETE SHAKE) LIQD Take 1 Bottle by mouth 2 (two) times daily. Patient not taking: Reported on 03/16/2015 01/25/15   Volanda Napoleon, MD  nystatin (MYCOSTATIN) 100000 UNIT/ML suspension Take 5 mLs (500,000 Units total) by mouth 4 (four) times daily. 03/16/15   Varney Biles, MD  ondansetron (ZOFRAN) 4 MG tablet Take 1 tablet (4 mg total) by mouth every 6 (six) hours. Patient taking differently: Take 4 mg by mouth every 6 (six) hours. MODERATE TO SEVERE NAUSEA 08/17/14   Domenic Moras, PA-C  ondansetron (ZOFRAN) 8 MG tablet Take 1 tablet (8 mg total) by mouth 2 (two) times daily as needed (Nausea or vomiting). Begin 4 days after chemotherapy. Patient not taking: Reported on 03/25/2015 01/22/15   Volanda Napoleon, MD  oxyCODONE-acetaminophen (PERCOCET/ROXICET) 5-325 MG per tablet Take 1  tablet by mouth every 8 (eight) hours as needed for severe pain. Patient not taking: Reported on 03/25/2015 03/16/15   Varney Biles, MD  prochlorperazine (COMPAZINE) 10 MG tablet Take 1 tablet (10 mg total) by mouth every 6 (six) hours as needed (Nausea or vomiting). Patient not taking: Reported on 03/25/2015 02/04/15   Volanda Napoleon, MD  varenicline (CHANTIX) 0.5 MG tablet Take 1 tablet (0.5 mg total) by mouth 2 (two) times daily. Take one tablet daily for days 1-3. Then one tablet two times per day for days 4-7. Then start two tablets two times per day for remainder of therapy. Patient not taking: Reported on 03/16/2015 02/15/15   Volanda Napoleon, MD   BP 91/46 mmHg  Pulse 72  Temp(Src) 98.4 F (36.9 C) (Oral)  Resp 16  Ht 5\' 7"  (1.702 m)  Wt 107 lb (48.535 kg)  BMI 16.75 kg/m2  SpO2 95%  LMP 03/04/2015 Physical Exam  Constitutional: She is oriented to person, place, and time. She appears well-developed and well-nourished. No distress.  HENT:  Head: Normocephalic and atraumatic.  Eyes: EOM are normal. Pupils are equal, round, and reactive to light.  Neck: Normal range of motion.  Cardiovascular: Normal rate, regular rhythm and normal heart sounds.   Pulmonary/Chest: Effort normal and breath sounds normal.  Abdominal: Soft. She exhibits no distension. There is no tenderness.  Musculoskeletal: Normal range of motion.  Neurological: She is alert and oriented to person, place, and time.  5/5 strength in major muscle groups of  bilateral upper and lower extremities. Speech normal. No facial asymetry.   Skin: Skin is warm and dry.  Psychiatric: She has a normal mood and affect. Judgment normal.  Nursing note and vitals reviewed.   ED Course  Procedures (including critical care time) Labs Review Labs Reviewed  BASIC METABOLIC PANEL - Abnormal; Notable for the following:    Glucose, Bld 106 (*)    All other components within normal limits  CBC WITH DIFFERENTIAL/PLATELET - Abnormal;  Notable for the following:    RBC 3.57 (*)    Hemoglobin 9.6 (*)    HCT 30.5 (*)    RDW 24.5 (*)    Neutrophils Relative % 7 (*)    Lymphocytes Relative 69 (*)    Monocytes Relative 22 (*)    Neutro Abs 0.3 (*)    All other components within normal limits    Imaging Review Ct Head Wo Contrast  03/25/2015   CLINICAL DATA:  Headache. Undergoing chemotherapy for laryngeal cancer.  EXAM: CT HEAD WITHOUT CONTRAST  TECHNIQUE: Contiguous axial images were obtained from the base of the skull through the vertex without intravenous contrast.  COMPARISON:  12/15/2014  FINDINGS: Skull and Sinuses:Negative for fracture or destructive process. The mastoids, middle ears, and imaged paranasal sinuses are clear.  Orbits: Dysconjugate gaze, nonspecific.  Brain: No evidence of acute infarction, hemorrhage, hydrocephalus, or mass lesion/mass effect.  IMPRESSION: Negative head CT.   Electronically Signed   By: Monte Fantasia M.D.   On: 03/25/2015 21:04  I personally reviewed the imaging tests through PACS system I reviewed available ER/hospitalization records through the EMR    EKG Interpretation None      MDM   Final diagnoses:  Headache, unspecified headache type  Laryngeal carcinoma    Patient's headache improved at this time.  Head CT negative.  Doubt stroke.  No masses/cancerous lesions/mass effect noted.  Discharge home with primary care and oncology follow-up.  No indication for additional workup in the emergency department.    Jola Schmidt, MD 03/25/15 [id number]

## 2015-04-01 ENCOUNTER — Ambulatory Visit (HOSPITAL_BASED_OUTPATIENT_CLINIC_OR_DEPARTMENT_OTHER): Admission: RE | Admit: 2015-04-01 | Payer: Medicaid Other | Source: Ambulatory Visit

## 2015-04-05 ENCOUNTER — Other Ambulatory Visit: Payer: Medicaid Other

## 2015-04-05 ENCOUNTER — Ambulatory Visit: Payer: Medicaid Other | Admitting: Hematology & Oncology

## 2015-04-15 ENCOUNTER — Encounter (HOSPITAL_COMMUNITY): Payer: Self-pay | Admitting: *Deleted

## 2015-04-15 ENCOUNTER — Emergency Department (HOSPITAL_COMMUNITY)
Admission: EM | Admit: 2015-04-15 | Discharge: 2015-04-15 | Disposition: A | Discharge status: Home / Self Care | Payer: Medicaid Other | Attending: Emergency Medicine | Admitting: Emergency Medicine

## 2015-04-15 ENCOUNTER — Emergency Department (HOSPITAL_COMMUNITY): Payer: Medicaid Other

## 2015-04-15 DIAGNOSIS — S060X0A Concussion without loss of consciousness, initial encounter: Secondary | ICD-10-CM | POA: Insufficient documentation

## 2015-04-15 DIAGNOSIS — W109XXA Fall (on) (from) unspecified stairs and steps, initial encounter: Secondary | ICD-10-CM | POA: Insufficient documentation

## 2015-04-15 DIAGNOSIS — Y929 Unspecified place or not applicable: Secondary | ICD-10-CM | POA: Insufficient documentation

## 2015-04-15 DIAGNOSIS — Z79899 Other long term (current) drug therapy: Secondary | ICD-10-CM | POA: Diagnosis not present

## 2015-04-15 DIAGNOSIS — Y9301 Activity, walking, marching and hiking: Secondary | ICD-10-CM | POA: Diagnosis not present

## 2015-04-15 DIAGNOSIS — S81012A Laceration without foreign body, left knee, initial encounter: Secondary | ICD-10-CM | POA: Diagnosis not present

## 2015-04-15 DIAGNOSIS — Z87891 Personal history of nicotine dependence: Secondary | ICD-10-CM | POA: Insufficient documentation

## 2015-04-15 DIAGNOSIS — S0990XA Unspecified injury of head, initial encounter: Secondary | ICD-10-CM | POA: Diagnosis present

## 2015-04-15 DIAGNOSIS — Z859 Personal history of malignant neoplasm, unspecified: Secondary | ICD-10-CM | POA: Diagnosis not present

## 2015-04-15 DIAGNOSIS — Y998 Other external cause status: Secondary | ICD-10-CM | POA: Insufficient documentation

## 2015-04-15 DIAGNOSIS — Z21 Asymptomatic human immunodeficiency virus [HIV] infection status: Secondary | ICD-10-CM | POA: Diagnosis not present

## 2015-04-15 DIAGNOSIS — M25562 Pain in left knee: Secondary | ICD-10-CM

## 2015-04-15 DIAGNOSIS — W19XXXA Unspecified fall, initial encounter: Secondary | ICD-10-CM

## 2015-04-15 DIAGNOSIS — Z791 Long term (current) use of non-steroidal anti-inflammatories (NSAID): Secondary | ICD-10-CM | POA: Insufficient documentation

## 2015-04-15 MED ORDER — HYDROCODONE-ACETAMINOPHEN 5-325 MG PO TABS
1.0000 | ORAL_TABLET | Freq: Once | ORAL | Status: AC
  Administered 2015-04-15: 1 via ORAL
  Filled 2015-04-15: qty 1

## 2015-04-15 MED ORDER — OXYCODONE-ACETAMINOPHEN 5-325 MG PO TABS: 1.0000 | ORAL_TABLET | Freq: Four times a day (QID) | ORAL | Status: DC | PRN

## 2015-04-15 NOTE — ED Notes (Signed)
Declined W/C at D/C and was escorted to lobby by RN. 

## 2015-04-15 NOTE — ED Notes (Addendum)
Pt in stating she tripped while walking down her daughters stairs today, approx 5 steps, c/o pain to left knee and left ankle, also c/o generalized pain to bilateral legs but states that is not new, ambulatory to triage, pt states she hit her head but denies LOC, denies dizziness or vomiting

## 2015-04-15 NOTE — Discharge Instructions (Signed)
Wear knee sleeve and Use crutches as needed for comfort. Ice and elevate knee throughout the day. Alternate between ibuprofen and percocet for pain relief. Do not drive or operate machinery with pain medication use. Follow up with your regular doctor in 1 week for recheck. Return to the ER for changes or worsening symptoms.   Use Ibuprofen or Tylenol for pain. Get plenty of rest, use ice on your head. Stay in a quiet, not simulating, dark environment. No TV, computer use, video games until headache is resolved completely. Follow Up with primary care physician in 3-4 days if headache persists.  Return to the emergency department if patient becomes lethargic, begins vomiting or other change in mental status.    Concussion A concussion is a brain injury. It is caused by:  A hit to the head.  A quick and sudden movement (jolt) of the head or neck. A concussion is usually not life threatening. Even so, it can cause serious problems. If you had a concussion before, you may have concussion-like problems after a hit to your head. HOME CARE General Instructions  Follow your doctor's directions carefully.  Take medicines only as told by your doctor.  Only take medicines your doctor says are safe.  Do not drink alcohol until your doctor says it is okay. Alcohol and some drugs can slow down healing. They can also put you at risk for further injury.  If you are having trouble remembering things, write them down.  Try to do one thing at a time if you get distracted easily. For example, do not watch TV while making dinner.  Talk to your family members or close friends when making important decisions.  Follow up with your doctor as told.  Watch your symptoms. Tell others to do the same. Serious problems can sometimes happen after a concussion. Older adults are more likely to have these problems.  Tell your teachers, school nurse, school counselor, coach, Product/process development scientist, or work Freight forwarder about your  concussion. Tell them about what you can or cannot do. They should watch to see if:  It gets even harder for you to pay attention or concentrate.  It gets even harder for you to remember things or learn new things.  You need more time than normal to finish things.  You become annoyed (irritable) more than before.  You are not able to deal with stress as well.  You have more problems than before.  Rest. Make sure you:  Get plenty of sleep at night.  Go to sleep early.  Go to bed at the same time every day. Try to wake up at the same time.  Rest during the day.  Take naps when you feel tired.  Limit activities where you have to think a lot or concentrate. These include:  Doing homework.  Doing work related to a job.  Watching TV.  Using the computer. Returning To Your Regular Activities Return to your normal activities slowly, not all at once. You must give your body and brain enough time to heal.   Do not play sports or do other athletic activities until your doctor says it is okay.  Ask your doctor when you can drive, ride a bicycle, or work other vehicles or machines. Never do these things if you feel dizzy.  Ask your doctor about when you can return to work or school. Preventing Another Concussion It is very important to avoid another brain injury, especially before you have healed. In rare cases, another  injury can lead to permanent brain damage, brain swelling, or death. The risk of this is greatest during the first 7-10 days after your injury. Avoid injuries by:   Wearing a seat belt when riding in a car.  Not drinking too much alcohol.  Avoiding activities that could lead to a second concussion (such as contact sports).  Wearing a helmet when doing activities like:  Biking.  Skiing.  Skateboarding.  Skating.  Making your home safer by:  Removing things from the floor or stairways that could make you trip.  Using grab bars in bathrooms and  handrails by stairs.  Placing non-slip mats on floors and in bathtubs.  Improve lighting in dark areas. GET HELP IF:  It gets even harder for you to pay attention or concentrate.  It gets even harder for you to remember things or learn new things.  You need more time than normal to finish things.  You become annoyed (irritable) more than before.  You are not able to deal with stress as well.  You have more problems than before.  You have problems keeping your balance.  You are not able to react quickly when you should. Get help if you have any of these problems for more than 2 weeks:   Lasting (chronic) headaches.  Dizziness or trouble balancing.  Feeling sick to your stomach (nausea).  Seeing (vision) problems.  Being affected by noises or light more than normal.  Feeling sad, low, down in the dumps, blue, gloomy, or empty (depressed).  Mood changes (mood swings).  Feeling of fear or nervousness about what may happen (anxiety).  Feeling annoyed.  Memory problems.  Problems concentrating or paying attention.  Sleep problems.  Feeling tired all the time. GET HELP RIGHT AWAY IF:   You have bad headaches or your headaches get worse.  You have weakness (even if it is in one hand, leg, or part of the face).  You have loss of feeling (numbness).  You feel off balance.  You keep throwing up (vomiting).  You feel tired.  One black center of your eye (pupil) is larger than the other.  You twitch or shake violently (convulse).  Your speech is not clear (slurred).  You are more confused, easily angered (agitated), or annoyed than before.  You have more trouble resting than before.  You are unable to recognize people or places.  You have neck pain.  It is difficult to wake you up.  You have unusual behavior changes.  You pass out (lose consciousness). MAKE SURE YOU:   Understand these instructions.  Will watch your condition.  Will get help  right away if you are not doing well or get worse. Document Released: 11/22/2009 Document Revised: 04/20/2014 Document Reviewed: 06/26/2013 [facility] Patient Information 2015 [place], [place]. This information is not intended to replace advice given to you by your health care provider. Make sure you discuss any questions you have with your health care provider.  Arthralgia Arthralgia is joint pain. A joint is a place where two bones meet. Joint pain can happen for many reasons. The joint can be bruised, stiff, infected, or weak from aging. Pain usually goes away after resting and taking medicine for soreness.  HOME CARE  Rest the joint as told by your doctor.  Keep the sore joint raised (elevated) for the first 24 hours.  Put ice on the joint area.  Put ice in a plastic bag.  Place a towel between your skin and the bag.  Leave the ice on for 15-20 minutes, 03-04 times a day.  Wear your splint, casting, elastic bandage, or sling as told by your doctor.  Only take medicine as told by your doctor. Do not take aspirin.  Use crutches as told by your doctor. Do not put weight on the joint until told to by your doctor. GET HELP RIGHT AWAY IF:   You have bruising, puffiness (swelling), or more pain.  Your fingers or toes turn blue or start to lose feeling (numb).  Your medicine does not lessen the pain.  Your pain becomes severe.  You have a temperature by mouth above 102 F (38.9 C), not controlled by medicine.  You cannot move or use the joint. MAKE SURE YOU:   Understand these instructions.  Will watch your condition.  Will get help right away if you are not doing well or get worse. Document Released: 11/22/2009 Document Revised: 02/26/2012 Document Reviewed: 11/22/2009 [facility] Patient Information 2015 [place], [place]. This information is not intended to replace advice given to you by your health care provider. Make sure you discuss any questions you have with your health  care provider.  Cryotherapy Cryotherapy means treatment with cold. Ice or gel packs can be used to reduce both pain and swelling. Ice is the most helpful within the first 24 to 48 hours after an injury or flare-up from overusing a muscle or joint. Sprains, strains, spasms, burning pain, shooting pain, and aches can all be eased with ice. Ice can also be used when recovering from surgery. Ice is effective, has very few side effects, and is safe for most people to use. PRECAUTIONS  Ice is not a safe treatment option for people with:  Raynaud phenomenon. This is a condition affecting small blood vessels in the extremities. Exposure to cold may cause your problems to return.  Cold hypersensitivity. There are many forms of cold hypersensitivity, including:  Cold urticaria. Red, itchy hives appear on the skin when the tissues begin to warm after being iced.  Cold erythema. This is a red, itchy rash caused by exposure to cold.  Cold hemoglobinuria. Red blood cells break down when the tissues begin to warm after being iced. The hemoglobin that carry oxygen are passed into the urine because they cannot combine with blood proteins fast enough.  Numbness or altered sensitivity in the area being iced. If you have any of the following conditions, do not use ice until you have discussed cryotherapy with your caregiver:  Heart conditions, such as arrhythmia, angina, or chronic heart disease.  High blood pressure.  Healing wounds or open skin in the area being iced.  Current infections.  Rheumatoid arthritis.  Poor circulation.  Diabetes. Ice slows the blood flow in the region it is applied. This is beneficial when trying to stop inflamed tissues from spreading irritating chemicals to surrounding tissues. However, if you expose your skin to cold temperatures for too long or without the proper protection, you can damage your skin or nerves. Watch for signs of skin damage due to cold. HOME CARE  INSTRUCTIONS Follow these tips to use ice and cold packs safely.  Place a dry or damp towel between the ice and skin. A damp towel will cool the skin more quickly, so you may need to shorten the time that the ice is used.  For a more rapid response, add gentle compression to the ice.  Ice for no more than 10 to 20 minutes at a time. The bonier the area you  are icing, the less time it will take to get the benefits of ice.  Check your skin after 5 minutes to make sure there are no signs of a poor response to cold or skin damage.  Rest 20 minutes or more between uses.  Once your skin is numb, you can end your treatment. You can test numbness by very lightly touching your skin. The touch should be so light that you do not see the skin dimple from the pressure of your fingertip. When using ice, most people will feel these normal sensations in this order: cold, burning, aching, and numbness.  Do not use ice on someone who cannot communicate their responses to pain, such as small children or people with dementia. HOW TO MAKE AN ICE PACK Ice packs are the most common way to use ice therapy. Other methods include ice massage, ice baths, and cryosprays. Muscle creams that cause a cold, tingly feeling do not offer the same benefits that ice offers and should not be used as a substitute unless recommended by your caregiver. To make an ice pack, do one of the following:  Place crushed ice or a bag of frozen vegetables in a sealable plastic bag. Squeeze out the excess air. Place this bag inside another plastic bag. Slide the bag into a pillowcase or place a damp towel between your skin and the bag.  Mix 3 parts water with 1 part rubbing alcohol. Freeze the mixture in a sealable plastic bag. When you remove the mixture from the freezer, it will be slushy. Squeeze out the excess air. Place this bag inside another plastic bag. Slide the bag into a pillowcase or place a damp towel between your skin and the  bag. SEEK MEDICAL CARE IF:  You develop white spots on your skin. This may give the skin a blotchy (mottled) appearance.  Your skin turns blue or pale.  Your skin becomes waxy or hard.  Your swelling gets worse. MAKE SURE YOU:   Understand these instructions.  Will watch your condition.  Will get help right away if you are not doing well or get worse. Document Released: 07/31/2011 Document Revised: 04/20/2014 Document Reviewed: 07/31/2011 [facility] Patient Information 2015 [place], [place]. This information is not intended to replace advice given to you by your health care provider. Make sure you discuss any questions you have with your health care provider.  Knee Pain The knee is the complex joint between your thigh and your lower leg. It is made up of bones, tendons, ligaments, and cartilage. The bones that make up the knee are:  The femur in the thigh.  The tibia and fibula in the lower leg.  The patella or kneecap riding in the groove on the lower femur. CAUSES  Knee pain is a common complaint with many causes. A few of these causes are:  Injury, such as:  A ruptured ligament or tendon injury.  Torn cartilage.  Medical conditions, such as:  Gout  Arthritis  Infections  Overuse, over training, or overdoing a physical activity. Knee pain can be minor or severe. Knee pain can accompany debilitating injury. Minor knee problems often respond well to self-care measures or get well on their own. More serious injuries may need medical intervention or even surgery. SYMPTOMS The knee is complex. Symptoms of knee problems can vary widely. Some of the problems are:  Pain with movement and weight bearing.  Swelling and tenderness.  Buckling of the knee.  Inability to straighten or extend your knee.  Your knee locks and you cannot straighten it.  Warmth and redness with pain and fever.  Deformity or dislocation of the kneecap. DIAGNOSIS  Determining what is wrong  may be very straight forward such as when there is an injury. It can also be challenging because of the complexity of the knee. Tests to make a diagnosis may include:  Your caregiver taking a history and doing a physical exam.  Routine X-rays can be used to rule out other problems. X-rays will not reveal a cartilage tear. Some injuries of the knee can be diagnosed by:  Arthroscopy a surgical technique by which a small video camera is inserted through tiny incisions on the sides of the knee. This procedure is used to examine and repair internal knee joint problems. Tiny instruments can be used during arthroscopy to repair the torn knee cartilage (meniscus).  Arthrography is a radiology technique. A contrast liquid is directly injected into the knee joint. Internal structures of the knee joint then become visible on X-ray film.  An MRI scan is a non X-ray radiology procedure in which magnetic fields and a computer produce two- or three-dimensional images of the inside of the knee. Cartilage tears are often visible using an MRI scanner. MRI scans have largely replaced arthrography in diagnosing cartilage tears of the knee.  Blood work.  Examination of the fluid that helps to lubricate the knee joint (synovial fluid). This is done by taking a sample out using a needle and a syringe. TREATMENT The treatment of knee problems depends on the cause. Some of these treatments are:  Depending on the injury, proper casting, splinting, surgery, or physical therapy care will be needed.  Give yourself adequate recovery time. Do not overuse your joints. If you begin to get sore during workout routines, back off. Slow down or do fewer repetitions.  For repetitive activities such as cycling or running, maintain your strength and nutrition.  Alternate muscle groups. For example, if you are a weight lifter, work the upper body on one day and the lower body the next.  Either tight or weak muscles do not give the  proper support for your knee. Tight or weak muscles do not absorb the stress placed on the knee joint. Keep the muscles surrounding the knee strong.  Take care of mechanical problems.  If you have flat feet, orthotics or special shoes may help. See your caregiver if you need help.  Arch supports, sometimes with wedges on the inner or outer aspect of the heel, can help. These can shift pressure away from the side of the knee most bothered by osteoarthritis.  A brace called an "unloader" brace also may be used to help ease the pressure on the most arthritic side of the knee.  If your caregiver has prescribed crutches, braces, wraps or ice, use as directed. The acronym for this is PRICE. This means protection, rest, ice, compression, and elevation.  Nonsteroidal anti-inflammatory drugs (NSAIDs), can help relieve pain. But if taken immediately after an injury, they may actually increase swelling. Take NSAIDs with food in your stomach. Stop them if you develop stomach problems. Do not take these if you have a history of ulcers, stomach pain, or bleeding from the bowel. Do not take without your caregiver's approval if you have problems with fluid retention, heart failure, or kidney problems.  For ongoing knee problems, physical therapy may be helpful.  Glucosamine and chondroitin are over-the-counter dietary supplements. Both may help relieve the pain of osteoarthritis in the  knee. These medicines are different from the usual anti-inflammatory drugs. Glucosamine may decrease the rate of cartilage destruction.  Injections of a corticosteroid drug into your knee joint may help reduce the symptoms of an arthritis flare-up. They may provide pain relief that lasts a few months. You may have to wait a few months between injections. The injections do have a small increased risk of infection, water retention, and elevated blood sugar levels.  Hyaluronic acid injected into damaged joints may ease pain and  provide lubrication. These injections may work by reducing inflammation. A series of shots may give relief for as long as 6 months.  Topical painkillers. Applying certain ointments to your skin may help relieve the pain and stiffness of osteoarthritis. Ask your pharmacist for suggestions. Many over the-counter products are approved for temporary relief of arthritis pain.  In some countries, doctors often prescribe topical NSAIDs for relief of chronic conditions such as arthritis and tendinitis. A review of treatment with NSAID creams found that they worked as well as oral medications but without the serious side effects. PREVENTION  Maintain a healthy weight. Extra pounds put more strain on your joints.  Get strong, stay limber. Weak muscles are a common cause of knee injuries. Stretching is important. Include flexibility exercises in your workouts.  Be smart about exercise. If you have osteoarthritis, chronic knee pain or recurring injuries, you may need to change the way you exercise. This does not mean you have to stop being active. If your knees ache after jogging or playing basketball, consider switching to swimming, water aerobics, or other low-impact activities, at least for a few days a week. Sometimes limiting high-impact activities will provide relief.  Make sure your shoes fit well. Choose footwear that is right for your sport.  Protect your knees. Use the proper gear for knee-sensitive activities. Use kneepads when playing volleyball or laying carpet. Buckle your seat belt every time you drive. Most shattered kneecaps occur in car accidents.  Rest when you are tired. SEEK MEDICAL CARE IF:  You have knee pain that is continual and does not seem to be getting better.  SEEK IMMEDIATE MEDICAL CARE IF:  Your knee joint feels hot to the touch and you have a high fever. MAKE SURE YOU:   Understand these instructions.  Will watch your condition.  Will get help right away if you are  not doing well or get worse. Document Released: 10/01/2007 Document Revised: 02/26/2012 Document Reviewed: 10/01/2007 [facility] Patient Information 2015 [place], [place]. This information is not intended to replace advice given to you by your health care provider. Make sure you discuss any questions you have with your health care provider.

## 2015-04-15 NOTE — ED Provider Notes (Signed)
CSN: [id number]     Arrival date & time 04/15/15  2003 History  This chart was scribed for non-physician practitioner working with Serita Grit, MD by Molli Posey, ED Scribe. This patient was seen in room TR11C/TR11C and the patient's care was started at 8:54 PM.   Chief Complaint  Patient presents with  . Fall    Patient is a 49 y.o. female presenting with fall. The history is provided by the patient. No language interpreter was used.  Fall This is a new problem. The current episode started 1 to 2 hours ago. The problem occurs rarely. The problem has not changed since onset.Associated symptoms include headaches (mild). Pertinent negatives include no chest pain, no abdominal pain and no shortness of breath. The symptoms are aggravated by walking. Nothing relieves the symptoms. She has tried nothing for the symptoms. The treatment provided no relief.   HPI Comments: [name] is a 49 y.o. 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 Pt states that she fell down her daughter's 4-5 concrete steps falling onto her left side. Pt states that she was not dizzy prior to the fall and denies LOC. Pt now complains of intermittent left knee pain that she rates as a 7/10 currently.  She describes her knee pain as throbbing and states it does not radiate. She says that weight bearing aggravates her pain. Pt also complains of a moderate HA at this time and says it is similar to her typical HA episodes. Pt reports that she is UTD on her tetanus. She states that she is not on any anticoagulation medication. Pt reports no alleviating factors at this time given that she has not tried anything for her pain. She denies any visual disturbances, confusion, AMS, tinnitus, hearing loss, SOB, CP, abdominal pain, nausea, vomiting, weakness, numbness, pelvic pain, back or neck pain, dizziness,  or tingling. No incontinence or cauda equina symptoms.   Past Medical History  Diagnosis Date  . HIV (human immunodeficiency virus infection)   . Cancer     head and neck   Past Surgical History  Procedure Laterality Date  . Cesarean section     History reviewed. No pertinent family history. History  Substance Use Topics  . Smoking status: Former Smoker -- 0.50 packs/day for 32 years    Types: Cigarettes    Start date: 01/13/1986    Quit date: 11/26/2014  . Smokeless tobacco: Never Used     Comment: quit smoking December 2015  . Alcohol Use: No   OB History    No data available     Review of Systems  Constitutional: Negative for activity change.  HENT: Negative for facial swelling, hearing loss and tinnitus.   Eyes: Negative for visual disturbance.  Respiratory: Negative for shortness of breath.   Cardiovascular: Negative for chest pain.  Gastrointestinal: Negative for nausea, vomiting and abdominal pain.  Genitourinary: Negative for difficulty urinating (no incontinence) and pelvic pain.  Musculoskeletal: Positive for arthralgias. Negative for back pain, joint swelling and neck pain.  Skin: Positive for wound (small abrasion to L knee).  Allergic/Immunologic: Positive for immunocompromised state (on chemo).  Neurological: Positive for headaches (mild). Negative for dizziness, syncope, weakness, light-headedness and numbness.  Hematological: Does not bruise/bleed easily.  Psychiatric/Behavioral: Negative for confusion.    10 Systems reviewed and all are negative for acute change except as noted in  the HPI.  Allergies  Ancef and Ciprofloxacin  Home Medications   Prior to Admission medications   Medication Sig Start Date End Date Taking? Authorizing Provider  acyclovir (ZOVIRAX) 400 MG tablet Take 2 tablets (800 mg total) by mouth 2 (two) times daily. 02/01/15   Volanda Napoleon, MD  Alum & Mag Hydroxide-Simeth (MAGIC MOUTHWASH W/LIDOCAINE) SOLN Take 5 mLs by mouth 3  (three) times daily. Patient not taking: Reported on 03/25/2015 01/29/15   Volanda Napoleon, MD  dexamethasone (DECADRON) 4 MG tablet Take 2 tablets (8 mg total) by mouth 2 (two) times daily. Start the day before Taxotere. Take once the day after, then 2 times a day x 2d. Patient not taking: Reported on 03/16/2015 02/04/15   Volanda Napoleon, MD  fentaNYL (DURAGESIC - DOSED MCG/HR) 25 MCG/HR patch Place 1 patch (25 mcg total) onto the skin every 3 (three) days. Patient not taking: Reported on 03/16/2015 02/04/15   Eliezer Bottom, NP  fluconazole (DIFLUCAN) 200 MG tablet Take 1 tablet (200 mg total) by mouth daily. Patient not taking: Reported on 03/16/2015 02/01/15   Volanda Napoleon, MD  HYDROcodone-acetaminophen [facility]) 10-325 MG per tablet Take 1 tablet by mouth every 6 (six) hours as needed. 03/18/15   Historical Provider, MD  lidocaine (XYLOCAINE) 2 % solution Use as directed 10 mLs in the mouth or throat every 4 (four) hours as needed for mouth pain. Swish & swallow Patient not taking: Reported on 03/16/2015 03/15/15   Volanda Napoleon, MD  lidocaine-prilocaine (EMLA) cream Apply to affected area once Patient not taking: Reported on 03/16/2015 01/22/15   Volanda Napoleon, MD  LORazepam (ATIVAN) 0.5 MG tablet Take 1 tablet (0.5 mg total) by mouth every 6 (six) hours as needed (Nausea or vomiting). Patient not taking: Reported on 03/16/2015 02/04/15   Volanda Napoleon, MD  naproxen (NAPROSYN) 500 MG tablet Take 1 tablet (500 mg total) by mouth 2 (two) times daily. 03/25/15   Jola Schmidt, MD  Nutritional Supplements (ENSURE COMPLETE SHAKE) LIQD Take 1 Bottle by mouth 2 (two) times daily. Patient not taking: Reported on 03/16/2015 01/25/15   Volanda Napoleon, MD  nystatin (MYCOSTATIN) 100000 UNIT/ML suspension Take 5 mLs (500,000 Units total) by mouth 4 (four) times daily. 03/16/15   Varney Biles, MD  ondansetron (ZOFRAN) 4 MG tablet Take 1 tablet (4 mg total) by mouth every 6 (six) hours. Patient taking differently:  Take 4 mg by mouth every 6 (six) hours. MODERATE TO SEVERE NAUSEA 08/17/14   Domenic Moras, PA-C  ondansetron (ZOFRAN) 8 MG tablet Take 1 tablet (8 mg total) by mouth 2 (two) times daily as needed (Nausea or vomiting). Begin 4 days after chemotherapy. Patient not taking: Reported on 03/25/2015 01/22/15   Volanda Napoleon, MD  oxyCODONE-acetaminophen (PERCOCET/ROXICET) 5-325 MG per tablet Take 1 tablet by mouth every 8 (eight) hours as needed for severe pain. Patient not taking: Reported on 03/25/2015 03/16/15   Varney Biles, MD  prochlorperazine (COMPAZINE) 10 MG tablet Take 1 tablet (10 mg total) by mouth every 6 (six) hours as needed (Nausea or vomiting). Patient not taking: Reported on 03/25/2015 02/04/15   Volanda Napoleon, MD  varenicline (CHANTIX) 0.5 MG tablet Take 1 tablet (0.5 mg total) by mouth 2 (two) times daily. Take one tablet daily for days 1-3. Then one tablet two times per day for days 4-7. Then start two tablets two times per day for remainder of therapy. Patient not taking: Reported on 03/16/2015  02/15/15   Volanda Napoleon, MD   BP 119/78 mmHg  Pulse 85  Temp(Src) 98 F (36.7 C) (Oral)  Resp 20  Wt 112 lb (50.803 kg)  SpO2 100%  LMP 03/04/2015 Physical Exam  Constitutional: She is oriented to person, place, and time. Vital signs are normal. She appears well-developed and well-nourished.  Non-toxic appearance. No distress.  Afebrile, nontoxic, NAD  HENT:  Head: Normocephalic and atraumatic. Head is without raccoon's eyes, without abrasion and without contusion.  Mouth/Throat: Mucous membranes are normal.  [place]/AT, no scalp TTP or contusions, no abrasions, no battle's sign or raccoon eyes, no scalp bony crepitus or deformities.   Eyes: Conjunctivae and EOM are normal. Pupils are equal, round, and reactive to light. Right eye exhibits no discharge. Left eye exhibits no discharge.  PERRL, EOMI, no nystagmus, no visual field deficits   Neck: Normal range of motion. Neck supple. No spinous  process tenderness and no muscular tenderness present. No rigidity. Normal range of motion present.  FROM intact without spinous process or paraspinous muscle TTP, no bony stepoffs or deformities, no muscle spasms. No rigidity or meningeal signs. No bruising or swelling.  Cardiovascular: Normal rate and intact distal pulses.   Pulmonary/Chest: Effort normal. No respiratory distress.  Abdominal: Normal appearance. She exhibits no distension.  Musculoskeletal: Normal range of motion.       Left knee: She exhibits laceration (abrasion). She exhibits normal range of motion, no swelling, no effusion, no ecchymosis, no erythema, normal alignment, no LCL laxity, normal patellar mobility and no MCL laxity. Tenderness found. Medial joint line and lateral joint line tenderness noted.  L knee with FROM intact, mild diffuse joint line TTP, no swelling/effusion/deformity, no bruising, small abrasion to lateral side without erythema or warmth, no abnormal alignment or patellar mobility, no varus/valgus laxity, neg anterior drawer test, no crepitus. Gait mildly antalgic due to pain, but ambulatory without assistance.  MAE x4 Strength and sensation grossly intact Distal pulses intact All spinal levels nonTTP without bony deformities.  Neurological: She is alert and oriented to person, place, and time. She has normal strength. No cranial nerve deficit or sensory deficit. Coordination and gait normal. GCS eye subscore is 4. GCS verbal subscore is 5. GCS motor subscore is 6.  CN 2-12 grossly intact A&O x4 GCS 15 Sensation and strength intact Gait nonataxic including with tandem walking Coordination with finger-to-nose WNL  Skin: Skin is warm and dry. Abrasion noted. No rash noted.  Small abrasion to L knee as noted above  Psychiatric: She has a normal mood and affect. Her behavior is normal.  Nursing note and vitals reviewed.   ED Course  Procedures   DIAGNOSTIC STUDIES: Oxygen Saturation is 100% on RA,  normal by my interpretation.    COORDINATION OF CARE: 9:00 PM Discussed treatment plan with pt at bedside and pt agreed to plan.   Labs Review Labs Reviewed - No data to display  Imaging Review Dg Knee Complete 4 Views Left  04/15/2015   CLINICAL DATA:  Anterior knee pain after falling today. Initial encounter.  EXAM: LEFT KNEE - COMPLETE 4+ VIEW  COMPARISON:  None.  FINDINGS: The mineralization and alignment are normal. There is no evidence of acute fracture or dislocation. The joint spaces are maintained. No significant joint effusion is identified. There may be some soft tissue swelling anteriorly in the distal thigh.  IMPRESSION: No acute osseous findings. Possible suprapatellar soft tissue swelling without significant joint effusion.   Electronically Signed   By:  Richardean Sale M.D.   On: 04/15/2015 21:59     EKG Interpretation None      MDM   Final diagnoses:  Fall  Left knee pain  Concussion, without loss of consciousness, initial encounter    49 y.o. female here with L knee pain after fall down 4 steps. Extremities neurovascularly intact with soft compartments. Small abrasion to knee, with mild TTP to joint line. Will obtain imaging. Also states she hit her head, mild HA, neurologically intact without deficits, no LOC, no scalp crepitus or contusions. Doubt need for imaging. Will give pain control and reassess after imaging. Tetanus UTD.  10:26 PM Xray neg. Will give knee sleeve and crutches. Discussed concussion tx with mental rest. Discussed RICE therapy. Will have her f/up with PCP in 1wk. I explained the diagnosis and have given explicit precautions to return to the ER including for any other new or worsening symptoms. The patient understands and accepts the medical plan as it's been dictated and I have answered their questions. Discharge instructions concerning home care and prescriptions have been given. The patient is STABLE and is discharged to home in good  condition.   I personally performed the services described in this documentation, which was scribed in my presence. The recorded information has been reviewed and is accurate.  BP 105/52 mmHg  Pulse 77  Temp(Src) 98.6 F (37 C) (Oral)  Resp 16  Wt 112 lb (50.803 kg)  SpO2 100%  LMP 03/04/2015  Meds ordered this encounter  Medications  . HYDROcodone-acetaminophen (NORCO/VICODIN) 5-325 MG per tablet 1 tablet    Sig:   . oxyCODONE-acetaminophen (PERCOCET) 5-325 MG per tablet    Sig: Take 1 tablet by mouth every 6 (six) hours as needed for severe pain.    Dispense:  6 tablet    Refill:  0    Order Specific Question:  Supervising Provider    Answer:  Noemi Chapel [[id number]]       Natalie Sato Camprubi-Soms, PA-C 04/15/15 2226  Serita Grit, MD 04/17/15 1227

## 2015-04-19 ENCOUNTER — Ambulatory Visit (HOSPITAL_BASED_OUTPATIENT_CLINIC_OR_DEPARTMENT_OTHER): Payer: Medicaid Other

## 2015-04-19 ENCOUNTER — Telehealth: Payer: Self-pay | Admitting: Hematology & Oncology

## 2015-04-19 NOTE — Telephone Encounter (Signed)
Pt cx 5-2 CT downstairs MD still wants to see pt on 5-6

## 2015-04-20 ENCOUNTER — Telehealth: Payer: Self-pay | Admitting: *Deleted

## 2015-04-20 NOTE — Telephone Encounter (Signed)
Patient currently has a pain management clinic however she is unhappy and would like a referral to [facility]. Spoke to Dr Marin Olp who reports that patient has not followed through with her last several appointments. If the patient would like to change pain management clinics, he is willing to discuss this with her at her next appointment. She is currently scheduled this Friday. Attempted to call patient back and inform her of Dr Antonieta Pert decision, however her phone had a message that she was unavailable without the possibility of leaving a message.

## 2015-04-23 ENCOUNTER — Other Ambulatory Visit: Payer: Medicaid Other

## 2015-04-23 ENCOUNTER — Ambulatory Visit: Payer: Medicaid Other | Admitting: Hematology & Oncology

## 2015-05-04 ENCOUNTER — Other Ambulatory Visit (HOSPITAL_BASED_OUTPATIENT_CLINIC_OR_DEPARTMENT_OTHER): Payer: Medicaid Other

## 2015-05-05 ENCOUNTER — Encounter: Payer: Self-pay | Admitting: Nurse Practitioner

## 2015-05-05 NOTE — Progress Notes (Signed)
Patient called and lvm stating she needed to r/s previously missed MD appointment and scans. Attempted to call pt back x2 both times it states wireless customer is not able to accept calls. Will continue to call.

## 2015-05-25 ENCOUNTER — Other Ambulatory Visit: Payer: Medicaid Other

## 2015-05-25 ENCOUNTER — Ambulatory Visit: Payer: Medicaid Other | Admitting: Hematology & Oncology

## 2015-05-31 ENCOUNTER — Other Ambulatory Visit: Payer: Self-pay | Admitting: Hematology & Oncology

## 2015-06-16 ENCOUNTER — Encounter: Payer: Self-pay | Admitting: Family

## 2015-06-23 ENCOUNTER — Telehealth: Payer: Self-pay | Admitting: Hematology & Oncology

## 2015-06-23 NOTE — Telephone Encounter (Signed)
Tried to contact numbers on file,on referral,  and cell phone number([phone])  given and can't reach pt to schedule appt. Contacted referring office ([facility]) will attempt to contact pt to have are call the office for an appt.

## 2015-06-29 ENCOUNTER — Encounter: Payer: Self-pay | Admitting: *Deleted

## 2015-07-05 ENCOUNTER — Telehealth: Payer: Self-pay | Admitting: Hematology & Oncology

## 2015-07-05 NOTE — Telephone Encounter (Signed)
CONTACT [facility] REGARDING THE PT REFERRAL, IS CLOSED DUE TO NO RESPONSE FROM PT

## 2015-07-08 ENCOUNTER — Encounter: Payer: Medicaid Other | Admitting: Family Medicine

## 2015-07-14 ENCOUNTER — Other Ambulatory Visit (HOSPITAL_BASED_OUTPATIENT_CLINIC_OR_DEPARTMENT_OTHER): Payer: Medicaid Other

## 2015-07-14 ENCOUNTER — Other Ambulatory Visit: Payer: Self-pay | Admitting: *Deleted

## 2015-07-14 ENCOUNTER — Ambulatory Visit (HOSPITAL_BASED_OUTPATIENT_CLINIC_OR_DEPARTMENT_OTHER): Payer: Medicaid Other

## 2015-07-14 ENCOUNTER — Ambulatory Visit (HOSPITAL_BASED_OUTPATIENT_CLINIC_OR_DEPARTMENT_OTHER): Payer: Medicaid Other | Admitting: Family

## 2015-07-14 ENCOUNTER — Encounter: Payer: Self-pay | Admitting: Family

## 2015-07-14 VITALS — BP 125/68 | HR 60 | Temp 97.5°F | Resp 18 | Wt 132.0 lb

## 2015-07-14 DIAGNOSIS — C329 Malignant neoplasm of larynx, unspecified: Secondary | ICD-10-CM

## 2015-07-14 DIAGNOSIS — C321 Malignant neoplasm of supraglottis: Secondary | ICD-10-CM

## 2015-07-14 DIAGNOSIS — F419 Anxiety disorder, unspecified: Secondary | ICD-10-CM

## 2015-07-14 DIAGNOSIS — D509 Iron deficiency anemia, unspecified: Secondary | ICD-10-CM

## 2015-07-14 LAB — CBC WITH DIFFERENTIAL (CANCER CENTER ONLY)
BASO#: 0 10*3/uL (ref 0.0–0.2)
BASO%: 0.4 % (ref 0.0–2.0)
EOS%: 2 % (ref 0.0–7.0)
Eosinophils Absolute: 0.2 10*3/uL (ref 0.0–0.5)
HCT: 38.2 % (ref 34.8–46.6)
HEMOGLOBIN: 13.2 g/dL (ref 11.6–15.9)
LYMPH#: 2.6 10*3/uL (ref 0.9–3.3)
LYMPH%: 34.3 % (ref 14.0–48.0)
MCH: 31.4 pg (ref 26.0–34.0)
MCHC: 34.6 g/dL (ref 32.0–36.0)
MCV: 91 fL (ref 81–101)
MONO#: 0.6 10*3/uL (ref 0.1–0.9)
MONO%: 7.9 % (ref 0.0–13.0)
NEUT#: 4.2 10*3/uL (ref 1.5–6.5)
NEUT%: 55.4 % (ref 39.6–80.0)
PLATELETS: 253 10*3/uL (ref 145–400)
RBC: 4.21 10*6/uL (ref 3.70–5.32)
RDW: 13.5 % (ref 11.1–15.7)
WBC: 7.6 10*3/uL (ref 3.9–10.0)

## 2015-07-14 LAB — FERRITIN CHCC: Ferritin: 26 ng/ml (ref 9–269)

## 2015-07-14 LAB — COMPREHENSIVE METABOLIC PANEL (CC13)
ALT: 23 U/L (ref 0–55)
AST: 25 U/L (ref 5–34)
Albumin: 4.2 g/dL (ref 3.5–5.0)
Alkaline Phosphatase: 93 U/L (ref 40–150)
Anion Gap: 11 mEq/L (ref 3–11)
BUN: 9.6 mg/dL (ref 7.0–26.0)
CHLORIDE: 102 meq/L (ref 98–109)
CO2: 26 meq/L (ref 22–29)
Calcium: 10 mg/dL (ref 8.4–10.4)
Creatinine: 0.8 mg/dL (ref 0.6–1.1)
EGFR: 88 mL/min/{1.73_m2} — ABNORMAL LOW (ref >90)
GLUCOSE: 100 mg/dL (ref 70–140)
Potassium: 3.9 mEq/L (ref 3.5–5.1)
Sodium: 140 mEq/L (ref 136–145)
TOTAL PROTEIN: 7.6 g/dL (ref 6.4–8.3)
Total Bilirubin: 0.39 mg/dL (ref 0.20–1.20)

## 2015-07-14 LAB — IRON AND TIBC CHCC
%SAT: 28 % (ref 21–57)
IRON: 98 ug/dL (ref 41–142)
TIBC: 348 ug/dL (ref 236–444)
UIBC: 250 ug/dL (ref 120–384)

## 2015-07-14 LAB — MAGNESIUM: Magnesium: 2 mg/dL (ref 1.5–2.5)

## 2015-07-14 MED ORDER — SODIUM CHLORIDE 0.9 % IJ SOLN
10.0000 mL | INTRAMUSCULAR | Status: DC | PRN
  Administered 2015-07-14: 10 mL via INTRAVENOUS
  Filled 2015-07-14: qty 10

## 2015-07-14 MED ORDER — DIAZEPAM 5 MG PO TABS: 5.0000 mg | ORAL_TABLET | Freq: Three times a day (TID) | ORAL | Status: DC | PRN

## 2015-07-14 MED ORDER — HEPARIN SOD (PORK) LOCK FLUSH 100 UNIT/ML IV SOLN
500.0000 [IU] | Freq: Once | INTRAVENOUS | Status: AC
  Administered 2015-07-14: 500 [IU] via INTRAVENOUS
  Filled 2015-07-14: qty 5

## 2015-07-14 NOTE — Progress Notes (Signed)
Hematology and Oncology Follow Up Visit  [name] [id number] 1966/10/20 49 y.o. 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 She has missed several appointment with [place]. We have not seen her since March. She saw dr. Tharon Aquas with [facility] yesterday and had a laryngoscopy which showed new tumor growth.  She was recently in the ED with c/o sore throat and hoarseness. She has also had episodes of blacking out and short term memory loss.   Her last scans were in February. We will repeat these next week.  She states that she feels nervous and anxious a lot of the time.  She denies fever, n/v, chills, cough, oral sores, rash, dizziness, headaches, vision changes, SOB, chest pain, palpitations, abdominal pain, constipation, diarrhea, blood in urine or stool.  No lymphadenopathy found on assessment.  She is able to eat and has a good appetite. No problems swallowing. She is staying hydrated. She has gained 10 lbs since her last visit.  No swelling or tenderness in her extremities. She has some mild numbness and tingling in her right hand. No new aches or pains.  She is living with her daughter here in town now.   Medications:    Medication List       This list is accurate as of: 07/14/15 10:53 AM.  Always use your most recent med list.               acyclovir 400 MG tablet  Commonly known as:  ZOVIRAX  Take 2 tablets (800 mg total) by mouth 2 (two) times daily.     dexamethasone 4 MG tablet  Commonly known as:  DECADRON  Take 2 tablets (8 mg total) by mouth 2 (two) times daily. Start the day before Taxotere. Take once the day after, then 2 times a day x 2d.     ENSURE COMPLETE SHAKE Liqd  Take 1 Bottle by mouth 2 (two) times daily.     fentaNYL 25 MCG/HR patch  Commonly known as:  DURAGESIC - dosed mcg/hr  Place 1 patch (25 mcg total) onto the skin every 3 (three) days.     fluconazole 200 MG tablet  Commonly known as:  DIFLUCAN  Take 1 tablet (200 mg total) by mouth daily.     HYDROcodone-acetaminophen 10-325 MG per tablet  Commonly known as:  NORCO  Take 1 tablet by mouth every 6 (six) hours as needed.     lidocaine 2 % solution  Commonly known as:  XYLOCAINE  Use as directed 10 mLs in the mouth or throat every 4 (four) hours as needed for mouth pain. Swish & swallow     lidocaine 2 % solution  Commonly known as:  XYLOCAINE  SWISH AND SWALLOW 10 MLS BY MOUTH EVERY 4 HOURS AS NEEDED FOR MOUTH PAIN AS DIRECTED     lidocaine-prilocaine cream  Commonly known as:  EMLA  Apply to affected area once     LORazepam 0.5 MG tablet  Commonly known as:  ATIVAN  Take 1 tablet (0.5 mg total) by mouth every 6 (six) hours as needed (Nausea or vomiting).     magic mouthwash w/lidocaine Soln  Take 5 mLs by mouth 3 (three) times daily.  naproxen 500 MG tablet  Commonly known as:  NAPROSYN  Take 1 tablet (500 mg total) by mouth 2 (two) times daily.     nystatin 100000 UNIT/ML suspension  Commonly known as:  MYCOSTATIN  Take 5 mLs (500,000 Units total) by mouth 4 (four) times daily.     ondansetron 4 MG tablet  Commonly known as:  ZOFRAN  Take 1 tablet (4 mg total) by mouth every 6 (six) hours.     ondansetron 8 MG tablet  Commonly known as:  ZOFRAN  Take 1 tablet (8 mg total) by mouth 2 (two) times daily as needed (Nausea or vomiting). Begin 4 days after chemotherapy.     oxyCODONE-acetaminophen 5-325 MG per tablet  Commonly known as:  PERCOCET/ROXICET  Take 1 tablet by mouth every 8 (eight) hours as needed for severe pain.     oxyCODONE-acetaminophen 5-325 MG per tablet  Commonly known as:  PERCOCET  Take 1 tablet by mouth every 6 (six) hours as needed for severe pain.     prochlorperazine 10 MG tablet    Commonly known as:  COMPAZINE  Take 1 tablet (10 mg total) by mouth every 6 (six) hours as needed (Nausea or vomiting).     varenicline 0.5 MG tablet  Commonly known as:  CHANTIX  Take 1 tablet (0.5 mg total) by mouth 2 (two) times daily. Take one tablet daily for days 1-3. Then one tablet two times per day for days 4-7. Then start two tablets two times per day for remainder of therapy.        Allergies:  Allergies  Allergen Reactions  . Ancef [Cefazolin] Hives  . Ciprofloxacin Hives    Past Medical History, Surgical history, Social history, and Family History were reviewed and updated.  Review of Systems: All other 10 point review of systems is negative.   Physical Exam:  vitals were not taken for this visit.  Wt Readings from Last 3 Encounters:  04/15/15 112 lb (50.803 kg)  03/25/15 107 lb (48.535 kg)  03/16/15 112 lb 9.6 oz (51.075 kg)    Ocular: Sclerae unicteric, pupils equal, round and reactive to light Ear-nose-throat: Oropharynx clear, dentition fair Lymphatic: No cervical or supraclavicular adenopathy Lungs no rales or rhonchi, good excursion bilaterally Heart regular rate and rhythm, no murmur appreciated Abd soft, nontender, positive bowel sounds MSK no focal spinal tenderness, no joint edema Neuro: non-focal, well-oriented, appropriate affect Breasts: Deferred  Lab Results  Component Value Date   WBC 7.6 07/14/2015   HGB 13.2 07/14/2015   HCT 38.2 07/14/2015   MCV 91 07/14/2015   PLT 253 07/14/2015   Lab Results  Component Value Date   FERRITIN 196 03/08/2015   IRON 57 03/08/2015   TIBC 276 03/08/2015   UIBC 220 03/08/2015   IRONPCTSAT 20* 03/08/2015   Lab Results  Component Value Date   RBC 4.21 07/14/2015   No results found for: KPAFRELGTCHN, LAMBDASER, KAPLAMBRATIO No results found for: IGGSERUM, IGA, IGMSERUM No results found for: Odetta Pink, SPEI   Chemistry      Component  Value Date/Time   NA 135 03/25/2015 1924   NA 140 03/08/2015 0832   K 3.8 03/25/2015 1924   K 3.9 03/08/2015 0832   CL 101 03/25/2015 1924   CL 102 03/08/2015 0832   CO2 24 03/25/2015 1924   CO2 30 03/08/2015 0832   BUN 7 03/25/2015 1924   BUN 7 03/08/2015 0832   CREATININE 0.58 03/25/2015  1924   CREATININE 0.6 03/08/2015 0832      Component Value Date/Time   CALCIUM 8.6 03/25/2015 1924   CALCIUM 9.6 03/08/2015 0832   ALKPHOS 95* 03/08/2015 0832   ALKPHOS 126* 02/04/2015 1116   AST 28 03/08/2015 0832   AST 15 02/04/2015 1116   ALT 19 03/08/2015 0832   ALT 11 02/04/2015 1116   BILITOT 0.50 03/08/2015 0832   BILITOT 0.2 02/04/2015 1116     Impression and Plan: Ms. Costanza is a very pleasant 49 yo white female locally advanced squamous carcinoma of the right false vocal cord. She had a larangoscopy yesterday at [facility] which showed new tumor growth. She is symptomatic with sore throat.  We will get a PET scan and MRI of the neck next week.  We will schedule her follow-up and treatment plan once we have these results.  CBC, CMP and iron studies today all looked good.  She knows to call here with any questions or concerns and to go to the ED in the event of an emergency. We can certainly see her sooner if ned be.   Eliezer Bottom, NP 7/27/201610:53 AM

## 2015-07-14 NOTE — Patient Instructions (Signed)

## 2015-07-15 ENCOUNTER — Encounter (HOSPITAL_COMMUNITY): Payer: Self-pay | Admitting: Emergency Medicine

## 2015-07-15 ENCOUNTER — Emergency Department (HOSPITAL_COMMUNITY): Payer: Medicaid Other

## 2015-07-15 ENCOUNTER — Emergency Department (HOSPITAL_COMMUNITY)
Admission: EM | Admit: 2015-07-15 | Discharge: 2015-07-15 | Disposition: A | Discharge status: Home / Self Care | Payer: Medicaid Other | Attending: Emergency Medicine | Admitting: Emergency Medicine

## 2015-07-15 DIAGNOSIS — Z720 Tobacco use: Secondary | ICD-10-CM | POA: Diagnosis not present

## 2015-07-15 DIAGNOSIS — R059 Cough, unspecified: Secondary | ICD-10-CM

## 2015-07-15 DIAGNOSIS — D020 Carcinoma in situ of larynx: Secondary | ICD-10-CM | POA: Insufficient documentation

## 2015-07-15 DIAGNOSIS — R52 Pain, unspecified: Secondary | ICD-10-CM

## 2015-07-15 DIAGNOSIS — J029 Acute pharyngitis, unspecified: Secondary | ICD-10-CM | POA: Diagnosis present

## 2015-07-15 DIAGNOSIS — R05 Cough: Secondary | ICD-10-CM

## 2015-07-15 DIAGNOSIS — Z21 Asymptomatic human immunodeficiency virus [HIV] infection status: Secondary | ICD-10-CM | POA: Diagnosis not present

## 2015-07-15 DIAGNOSIS — C329 Malignant neoplasm of larynx, unspecified: Secondary | ICD-10-CM

## 2015-07-15 LAB — I-STAT CHEM 8, ED
BUN: 12 mg/dL (ref 6–20)
Calcium, Ion: 1.17 mmol/L (ref 1.12–1.23)
Chloride: 100 mmol/L — ABNORMAL LOW (ref 101–111)
Creatinine, Ser: 0.8 mg/dL (ref 0.44–1.00)
Glucose, Bld: 98 mg/dL (ref 65–99)
HCT: 36 % (ref 36.0–46.0)
Hemoglobin: 12.2 g/dL (ref 12.0–15.0)
Potassium: 3.9 mmol/L (ref 3.5–5.1)
SODIUM: 138 mmol/L (ref 135–145)
TCO2: 23 mmol/L (ref 0–100)

## 2015-07-15 LAB — CBC WITH DIFFERENTIAL/PLATELET
Basophils Absolute: 0 10*3/uL (ref 0.0–0.1)
Basophils Relative: 1 % (ref 0–1)
Eosinophils Absolute: 0.2 10*3/uL (ref 0.0–0.7)
Eosinophils Relative: 3 % (ref 0–5)
HCT: 35.4 % — ABNORMAL LOW (ref 36.0–46.0)
Hemoglobin: 12.2 g/dL (ref 12.0–15.0)
Lymphocytes Relative: 51 % — ABNORMAL HIGH (ref 12–46)
Lymphs Abs: 4.2 10*3/uL — ABNORMAL HIGH (ref 0.7–4.0)
MCH: 31.1 pg (ref 26.0–34.0)
MCHC: 34.5 g/dL (ref 30.0–36.0)
MCV: 90.3 fL (ref 78.0–100.0)
Monocytes Absolute: 0.9 10*3/uL (ref 0.1–1.0)
Monocytes Relative: 11 % (ref 3–12)
Neutro Abs: 2.8 10*3/uL (ref 1.7–7.7)
Neutrophils Relative %: 34 % — ABNORMAL LOW (ref 43–77)
Platelets: 236 10*3/uL (ref 150–400)
RBC: 3.92 MIL/uL (ref 3.87–5.11)
RDW: 13.7 % (ref 11.5–15.5)
WBC: 8.1 10*3/uL (ref 4.0–10.5)

## 2015-07-15 LAB — BASIC METABOLIC PANEL
Anion gap: 11 (ref 5–15)
BUN: 11 mg/dL (ref 6–20)
CHLORIDE: 101 mmol/L (ref 101–111)
CO2: 23 mmol/L (ref 22–32)
Calcium: 9.2 mg/dL (ref 8.9–10.3)
Creatinine, Ser: 0.8 mg/dL (ref 0.44–1.00)
GFR calc Af Amer: >60 mL/min (ref >60)
GFR calc non Af Amer: >60 mL/min (ref >60)
GLUCOSE: 100 mg/dL — AB (ref 65–99)
POTASSIUM: 4.1 mmol/L (ref 3.5–5.1)
Sodium: 135 mmol/L (ref 135–145)

## 2015-07-15 LAB — RAPID STREP SCREEN (MED CTR MEBANE ONLY): Streptococcus, Group A Screen (Direct): NEGATIVE

## 2015-07-15 MED ORDER — KETOROLAC TROMETHAMINE 30 MG/ML IJ SOLN
30.0000 mg | Freq: Once | INTRAMUSCULAR | Status: AC
  Administered 2015-07-15: 30 mg via INTRAVENOUS
  Filled 2015-07-15: qty 1

## 2015-07-15 MED ORDER — SODIUM CHLORIDE 0.9 % IV BOLUS (SEPSIS)
1000.0000 mL | Freq: Once | INTRAVENOUS | Status: AC
  Administered 2015-07-15: 1000 mL via INTRAVENOUS

## 2015-07-15 MED ORDER — ONDANSETRON HCL 4 MG/2ML IJ SOLN
4.0000 mg | Freq: Once | INTRAMUSCULAR | Status: AC
  Administered 2015-07-15: 4 mg via INTRAVENOUS
  Filled 2015-07-15: qty 2

## 2015-07-15 MED ORDER — LIDOCAINE VISCOUS 2 % MT SOLN: 15.0000 mL | OROMUCOSAL | Status: DC | PRN

## 2015-07-15 MED ORDER — GADOBENATE DIMEGLUMINE 529 MG/ML IV SOLN
15.0000 mL | Freq: Once | INTRAVENOUS | Status: AC | PRN
  Administered 2015-07-15: 13 mL via INTRAVENOUS

## 2015-07-15 MED ORDER — MORPHINE SULFATE 4 MG/ML IJ SOLN: 4.0000 mg | Freq: Once | INTRAMUSCULAR | Status: DC

## 2015-07-15 MED ORDER — LIDOCAINE VISCOUS 2 % MT SOLN
15.0000 mL | Freq: Once | OROMUCOSAL | Status: AC
  Administered 2015-07-15: 15 mL via OROMUCOSAL
  Filled 2015-07-15: qty 15

## 2015-07-15 MED ORDER — MORPHINE SULFATE 4 MG/ML IJ SOLN
4.0000 mg | Freq: Once | INTRAMUSCULAR | Status: DC
Start: 2015-07-15 — End: 2015-07-15

## 2015-07-15 NOTE — ED Provider Notes (Signed)
CSN: [id number]     Arrival date & time 07/15/15  0003 History   First MD Initiated Contact with Patient 07/15/15 0028     Chief Complaint  Patient presents with  . Sore Throat     (Consider location/radiation/quality/duration/timing/severity/associated sxs/prior Treatment) HPI  Blood pressure 100/70, pulse 79, temperature 98.2 F (36.8 C), temperature source Oral, resp. rate 16, SpO2 97 %.  [name] is a 49 y.o. female complaining off severe left-sided throat pain onset this afternoon. 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 Marin Olp. She had a scope yesterday, followed with oncology today and was informed that she has multiple recurrent lesions, the pain started after her visit with oncology. States she had a tactile fever, states that her grandson had strep but that was over a month ago. Skull exam is not consistent with a strep throat, she has PET scan of head and MRI with contrast of soft tissue neck scheduled for next week. Patient has HIV, states she's compliant with her anti-retrovirals, states that she follows at [facility]. Last CD4 count is unknown, rapid strep negative.   Past Medical History  Diagnosis Date  . HIV (human immunodeficiency virus infection)   . Cancer     head and neck   Past Surgical History  Procedure Laterality Date  . Cesarean section     No family history on file. History  Substance Use Topics  . Smoking status: Current Every Day Smoker -- 0.00 packs/day for 32 years    Types: Cigarettes    Start date: 01/13/1986  . Smokeless tobacco: Never Used     Comment: quit smoking December 2015  . Alcohol Use: No   OB History    No data available     Review of Systems  10 systems reviewed and found to be negative, except as noted in the HPI.   Allergies  Ancef and Ciprofloxacin  Home Medications   Prior to Admission medications   Medication Sig Start Date End Date Taking? Authorizing  Provider  diazepam (VALIUM) 5 MG tablet Take 1 tablet (5 mg total) by mouth every 8 (eight) hours as needed for anxiety. 07/14/15   Eliezer Bottom, NP  diclofenac sodium (VOLTAREN) 1 % GEL Place onto the skin. 06/03/15   Historical Provider, MD  DULoxetine (CYMBALTA) 30 MG capsule Take 1 cap daily for one week, then increase to two caps daily. 04/15/15 04/14/16  Historical Provider, MD  Emtricitab-Rilpivir-Tenofov DF (COMPLERA) 200-25-300 MG TABS TAKE 1 TABLET BY MOUTH EVERY DAY WITH FOOD 04/05/15   Historical Provider, MD  HYDROcodone-acetaminophen (NORCO) 10-325 MG per tablet Take 1 tablet by mouth every 6 (six) hours as needed. 03/18/15   Historical Provider, MD  HYDROcodone-acetaminophen (NORCO) 10-325 MG per tablet Take 1 tablet by mouth. 07/01/15 07/31/15  Historical Provider, MD  lidocaine (XYLOCAINE) 2 % solution Use as directed 15 mLs in the mouth or throat every 4 (four) hours as needed. 07/15/15   Branndon Tuite, PA-C  methadone (DOLOPHINE) 10 MG tablet Take 10 mg by mouth. 07/03/15 08/02/15  Historical Provider, MD  Nutritional Supplements (ENSURE COMPLETE SHAKE) LIQD Take 1 Bottle by mouth 2 (two) times daily. 01/25/15   Volanda Napoleon, MD  pregabalin (LYRICA) 50 MG capsule Take 50 mg by mouth. 06/03/15 06/02/16  Historical Provider, MD  varenicline (CHANTIX) 0.5 MG tablet Take 1 tablet (0.5 mg total) by mouth 2 (two) times daily. Take one tablet daily for days 1-3. Then one  tablet two times per day for days 4-7. Then start two tablets two times per day for remainder of therapy. 02/15/15   Volanda Napoleon, MD  VOLTAREN 1 % GEL APPLY 4 GM TO AFFECTED JOINTS QID PRN 06/04/15   Historical Provider, MD   BP 100/70 mmHg  Pulse 79  Temp(Src) 98.2 F (36.8 C) (Oral)  Resp 16  SpO2 97% Physical Exam  Constitutional: She is oriented to person, place, and time. She appears well-developed and well-nourished. No distress.  Hoarse voice  HENT:  Head: Normocephalic and atraumatic.  Mouth/Throat:  Oropharynx is clear and moist.  No drooling or stridor. Posterior pharynx mildly erythematous no significant tonsillar hypertrophy. No exudate. Soft palate rises symmetrically. No TTP or induration under tongue.   No tenderness to palpation of frontal or bilateral maxillary sinuses.  No mucosal edema in the nares.  Bilateral tympanic membranes with normal architecture and good light reflex.    Eyes: Conjunctivae and EOM are normal. Pupils are equal, round, and reactive to light.  Neck: Normal range of motion. Neck supple.  Cardiovascular: Normal rate, regular rhythm and intact distal pulses.   Pulmonary/Chest: Effort normal and breath sounds normal. No respiratory distress. She has no wheezes. She has no rales. She exhibits no tenderness.  Abdominal: Soft. There is no tenderness.  Musculoskeletal: Normal range of motion.  Neurological: She is alert and oriented to person, place, and time.  Skin: She is not diaphoretic.  Psychiatric: She has a normal mood and affect.  Nursing note and vitals reviewed.   ED Course  Procedures (including critical care time) Labs Review Labs Reviewed  CBC WITH DIFFERENTIAL/PLATELET - Abnormal; Notable for the following:    HCT 35.4 (*)    Neutrophils Relative % 34 (*)    Lymphocytes Relative 51 (*)    Lymphs Abs 4.2 (*)    All other components within normal limits  BASIC METABOLIC PANEL - Abnormal; Notable for the following:    Glucose, Bld 100 (*)    All other components within normal limits  I-STAT CHEM 8, ED - Abnormal; Notable for the following:    Chloride 100 (*)    All other components within normal limits  RAPID STREP SCREEN (NOT AT [facility])  CULTURE, GROUP A STREP    Imaging Review Dg Chest 2 View  07/15/2015   CLINICAL DATA:  Cough and sore throat today.  EXAM: CHEST  2 VIEW  COMPARISON:  11/11/2014  FINDINGS: Tip of the right chest port in the mid SVC. The cardiomediastinal contours are normal. The lungs are clear. Pulmonary  vasculature is normal. No consolidation, pleural effusion, or pneumothorax. No acute osseous abnormalities are seen.  IMPRESSION: No acute pulmonary process.   Electronically Signed   By: Jeb Levering M.D.   On: 07/15/2015 01:38     EKG Interpretation None      MDM   Final diagnoses:  Pain  Laryngeal carcinoma  Sore throat   Filed Vitals:   07/15/15 0011  BP: 100/70  Pulse: 79  Temp: 98.2 F (36.8 C)  TempSrc: Oral  Resp: 16  SpO2: 97%    Medications  sodium chloride 0.9 % bolus 1,000 mL (not administered)  ondansetron (ZOFRAN) injection 4 mg (not administered)  ketorolac (TORADOL) 30 MG/ML injection 30 mg (not administered)    [name] is a pleasant 49 y.o. female presenting with severe sore throat. Patient has recurrent laryngeal cancer, was told by Dr. Lelon Frohlich of her that she had 4 lesions  seen on scope yesterday, will need to initiate radiation in addition to chemotherapy. Patient is handling her secretions, she has MRI with contrast scheduled for next week, we'll obtain basic blood work, given IV fluids and obtain MRI.  Patient found to be somnolent when RN Kim returned to the room to it initiate IV, evaluated this patient she is nodding off, is different from her level of alertness when she initially came into the ED, morphine is DC'd, will give Toradol. Patient looked up on [employer], she had 120 methadone prescribed 8 days ago, patient also had 9010 mg Vicodin prescribed on the 14th of this month.   Case signed out to PA Piepnbrinkat shift change: Plan is to follow-up MRI, DC home with viscous lidocaine and close follow-up with Heme/onc.     Monico Blitz, PA-C 19/50/93 2671  Delora Fuel, MD 24/58/09 9833

## 2015-07-15 NOTE — ED Notes (Signed)
Pt. reports sore throat with occasional productive cough onset yesterday , denies fever or chills, respirations unlabored / airway intact.

## 2015-07-15 NOTE — ED Notes (Signed)
Pt st's throat started to hurt earlier today.  St's her grandson has strep.  Pt st's left side is worse than right.

## 2015-07-15 NOTE — ED Provider Notes (Signed)
MSE was initiated and I personally evaluated the patient and placed orders (if any) at  1:01 AM on July 15, 2015.  The patient appears stable so that the remainder of the MSE may be completed by another provider.  Blood pressure 100/70, pulse 79, temperature 98.2 F (36.8 C), temperature source Oral, resp. rate 16, SpO2 97 %.  [name] is a 49 y.o. female complaining of severe left-sided throat pain onset this afternoon. 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 Marin Olp. She had a scope yesterday, followed with oncology today and was informed that she has multiple recurrent lesions, the pain started after her visit with oncology. States she had a tactile fever, states that her grandson had strep but that was over a month ago. Skull exam is not consistent with a strep throat, she has PET scan of head and MRI with contrast of soft tissue neck scheduled for next week. We'll obtain basic blood work, start IV and order MRI. Patient has HIV, states she's compliant with her anti-retrovirals, states that she follows at [facility]. Last CD4 count is unknown, rapid strep negative. Ordered basic blood work and MRI with contrast.    Monico Blitz, PA-C 07/15/15 [place], PA-C 07/15/15 0139  Patient found to be somnolent when RN Maudie Mercury returned to the room to it initiate IV, I evaluated this patient and she is nodding off, responsive to voice. This is different from her level of alertness when she initially came into the ED, morphine is DC'd, will give Toradol after Istat chem8 to eval renal function.   Monico Blitz, PA-C 07/15/15 [place], PA-C 94/85/46 2703  Delora Fuel, MD 50/09/38 1829

## 2015-07-15 NOTE — ED Notes (Signed)
Pt to MRI at this time.

## 2015-07-15 NOTE — ED Provider Notes (Signed)
Patient care acquired from [facility], PA-C pending MRI results.  Results for orders placed or performed during the hospital encounter of 07/15/15  Rapid strep screen  Result Value Ref Range   Streptococcus, Group A Screen (Direct) NEGATIVE NEGATIVE  CBC with Differential  Result Value Ref Range   WBC 8.1 4.0 - 10.5 K/uL   RBC 3.92 3.87 - 5.11 MIL/uL   Hemoglobin 12.2 12.0 - 15.0 g/dL   HCT 35.4 (L) 36.0 - 46.0 %   MCV 90.3 78.0 - 100.0 fL   MCH 31.1 26.0 - 34.0 pg   MCHC 34.5 30.0 - 36.0 g/dL   RDW 13.7 11.5 - 15.5 %   Platelets 236 150 - 400 K/uL   Neutrophils Relative % 34 (L) 43 - 77 %   Neutro Abs 2.8 1.7 - 7.7 K/uL   Lymphocytes Relative 51 (H) 12 - 46 %   Lymphs Abs 4.2 (H) 0.7 - 4.0 K/uL   Monocytes Relative 11 3 - 12 %   Monocytes Absolute 0.9 0.1 - 1.0 K/uL   Eosinophils Relative 3 0 - 5 %   Eosinophils Absolute 0.2 0.0 - 0.7 K/uL   Basophils Relative 1 0 - 1 %   Basophils Absolute 0.0 0.0 - 0.1 K/uL  Basic metabolic panel  Result Value Ref Range   Sodium 135 135 - 145 mmol/L   Potassium 4.1 3.5 - 5.1 mmol/L   Chloride 101 101 - 111 mmol/L   CO2 23 22 - 32 mmol/L   Glucose, Bld 100 (H) 65 - 99 mg/dL   BUN 11 6 - 20 mg/dL   Creatinine, Ser 0.80 0.44 - 1.00 mg/dL   Calcium 9.2 8.9 - 10.3 mg/dL   GFR calc non Af Amer >60 >60 mL/min   GFR calc Af Amer >60 >60 mL/min   Anion gap 11 5 - 15  I-Stat Chem 8, ED  Result Value Ref Range   Sodium 138 135 - 145 mmol/L   Potassium 3.9 3.5 - 5.1 mmol/L   Chloride 100 (L) 101 - 111 mmol/L   BUN 12 6 - 20 mg/dL   Creatinine, Ser 0.80 0.44 - 1.00 mg/dL   Glucose, Bld 98 65 - 99 mg/dL   Calcium, Ion 1.17 1.12 - 1.23 mmol/L   TCO2 23 0 - 100 mmol/L   Hemoglobin 12.2 12.0 - 15.0 g/dL   HCT 36.0 36.0 - 46.0 %   Dg Chest 2 View  07/15/2015   CLINICAL DATA:  Cough and sore throat today.  EXAM: CHEST  2 VIEW  COMPARISON:  11/11/2014  FINDINGS: Tip of the right chest port in the mid SVC. The cardiomediastinal contours are  normal. The lungs are clear. Pulmonary vasculature is normal. No consolidation, pleural effusion, or pneumothorax. No acute osseous abnormalities are seen.  IMPRESSION: No acute pulmonary process.   Electronically Signed   By: Jeb Levering M.D.   On: 07/15/2015 01:38   Mr Neck Soft Tissue Only W Wo Contrast  07/15/2015   CLINICAL DATA:  Immunocompromised patient with recent diagnosis of laryngeal cancer, status post spot endoscopy yesterday. Fever.  EXAM: MRI OF THE NECK WITHOUT CONTRAST  TECHNIQUE: Multiplanar, multisequence MR imaging of the neck was performed. No intravenous contrast was administered.  COMPARISON:  None.  FINDINGS: Apposition of the true vocal cords, with symmetric, mildly edematous appearance of the true cords, no discrete nodule/submucosal lesion. Normal appearance of the pharynx.  6 mm bilateral pharyngeal lymph node. 10 mm LEFT level IIa lymph  node, 10 mm RIGHT level IIa lymph node.  Normal appearance of the major salivary glands. Normal appearance of the thyroid gland. Normal cervical vessel flow voids.  No abnormal osseous signal nor enhancement. Moderate C5-6 disc height loss, endplate spurring consistent with degenerative disc. RIGHT C5-6 subarticular disc protrusion results in apparent severe RIGHT C5-6 neural foraminal narrowing. Mild canal stenosis at C5-6.  Included paranasal sinuses and mastoid air cells are well aerated.  IMPRESSION: Edematous appearing true vocal cords without discrete nodule or submucosal mass.  Mild lymphadenopathy including small lateral pharyngeal lymph nodes.   Electronically Signed   By: Elon Alas M.D.   On: 07/15/2015 03:42    1. Laryngeal carcinoma   2. Cough   3. Pain   4. Sore throat    Discussed on MRI results with patient. Patient is requesting more pain medication will give dose of Xylocaine here. Discussed importance of following up with Dr. Marin Olp. Return precautions discussed. Patient is agreeable to plan and stable at time  of discharge.  Baron Sane, PA-C 83/81/84 0375  Delora Fuel, MD 43/60/67 7034

## 2015-07-15 NOTE — Discharge Instructions (Signed)
Please follow with your primary care doctor in the next 2 days for a check-up. They must obtain records for further management.  ° °Do not hesitate to return to the Emergency Department for any new, worsening or concerning symptoms.  ° °

## 2015-07-16 ENCOUNTER — Telehealth: Payer: Self-pay | Admitting: *Deleted

## 2015-07-16 NOTE — Telephone Encounter (Signed)
  Oncology Nurse Navigator Documentation Referral date to RadOnc/MedOnc: 07/15/15 (07/16/15 1316) Navigator Encounter Type: Introductory phone call (07/16/15 1316)      Called patient with introductory phone call.  LVM noting I will call again.  Gayleen Orem, RN, BSN, [place] at [place] [phone]

## 2015-07-16 NOTE — Progress Notes (Signed)
Head and Neck Cancer Location of Tumor / Histology: Squamous cell carcinoma of the supraglottic larynx  Patient presented  months ago with symptoms of: right sided neck mass,  Noticed 10 months ago,  Biopsies of (if applicable) revealed: 3/88/87 Bx Diagnosis: Squamous cell carcinoma of supraglottic larynx  Nutrition Status Yes No Comments  Weight changes? []  []  Gained 10 lbs over the last few months  Swallowing concerns? []  [x]  Good appetite  PEG? []  [x]     Referrals Yes No Comments  Social Work? []  []    Dentistry? []  []  [facility] 05/12/15 edentuous  Swallowing therapy? []  []    Nutrition? []  []    Med/Onc? [x]  []  Dr. Marin Olp  Seen 02/15/15 s/p DCF cycle#1   Safety Issues Yes No Comments  Prior radiation? []  [x]    Pacemaker/ICD? []  [x]    Possible current pregnancy? []  [x]    Is the patient on methotrexate? []  [x]     Tobacco/Marijuana/Snuff/ETOH use: 1/2ppd 30 years  quit 12/08/14, no smokeless tobacco, hx alcohol and  illicit drug use  Past/Anticipated interventions by otolaryngology, if any: Dr. Ermalinda Barrios  Past/Anticipated interventions by medical oncology, if any: 01/11/15: Carboplatin/paclitaxel x1 with Dr. Melven Sartorius.  3 cycles of docetaxel/cisplatin/5-FU with Dr. Marin Olp last in March 2016  Current Complaints / other details:  Widowed, reports episodes of blacking out and short term memory loss. Pt is HIV positive/AIDs since 07/03/11, compliant with anti-retrovirals.  Chronic Hepatitis C.   Allergies: Cipro,=hives ,Anceph= hives, PCNS=rash

## 2015-07-17 LAB — CULTURE, GROUP A STREP: STREP A CULTURE: NEGATIVE

## 2015-07-19 ENCOUNTER — Telehealth: Payer: Self-pay | Admitting: Hematology & Oncology

## 2015-07-19 ENCOUNTER — Encounter: Payer: Self-pay | Admitting: Radiation Oncology

## 2015-07-19 NOTE — Telephone Encounter (Addendum)
[name], Attorney at [facility] came in person to get pt's medical records. He had a signed release form from pt.  P: [phone] [email or web address]    12/18/2014 to current     COPY SCANNED

## 2015-07-22 ENCOUNTER — Other Ambulatory Visit: Payer: Self-pay | Admitting: *Deleted

## 2015-07-22 ENCOUNTER — Encounter: Payer: Self-pay | Admitting: Radiation Oncology

## 2015-07-22 DIAGNOSIS — C329 Malignant neoplasm of larynx, unspecified: Secondary | ICD-10-CM

## 2015-07-23 ENCOUNTER — Ambulatory Visit: Admission: RE | Admit: 2015-07-23 | Payer: Medicaid Other | Source: Ambulatory Visit

## 2015-07-23 ENCOUNTER — Telehealth: Payer: Self-pay | Admitting: *Deleted

## 2015-07-23 ENCOUNTER — Encounter (HOSPITAL_COMMUNITY): Payer: Self-pay

## 2015-07-23 ENCOUNTER — Ambulatory Visit (HOSPITAL_COMMUNITY)
Admission: RE | Admit: 2015-07-23 | Discharge: 2015-07-23 | Disposition: A | Discharge status: Home / Self Care | Payer: Medicaid Other | Source: Ambulatory Visit | Attending: Hematology & Oncology | Admitting: Hematology & Oncology

## 2015-07-23 ENCOUNTER — Ambulatory Visit (HOSPITAL_COMMUNITY): Payer: Medicaid Other

## 2015-07-23 ENCOUNTER — Ambulatory Visit
Admission: RE | Admit: 2015-07-23 | Discharge: 2015-07-23 | Disposition: A | Discharge status: Home / Self Care | Payer: Medicaid Other | Source: Ambulatory Visit | Attending: Radiation Oncology | Admitting: Radiation Oncology

## 2015-07-23 DIAGNOSIS — B1920 Unspecified viral hepatitis C without hepatic coma: Secondary | ICD-10-CM | POA: Insufficient documentation

## 2015-07-23 DIAGNOSIS — K769 Liver disease, unspecified: Secondary | ICD-10-CM | POA: Diagnosis not present

## 2015-07-23 DIAGNOSIS — E041 Nontoxic single thyroid nodule: Secondary | ICD-10-CM | POA: Diagnosis not present

## 2015-07-23 DIAGNOSIS — Z510 Encounter for antineoplastic radiation therapy: Secondary | ICD-10-CM | POA: Insufficient documentation

## 2015-07-23 DIAGNOSIS — R918 Other nonspecific abnormal finding of lung field: Secondary | ICD-10-CM | POA: Insufficient documentation

## 2015-07-23 DIAGNOSIS — C321 Malignant neoplasm of supraglottis: Secondary | ICD-10-CM | POA: Insufficient documentation

## 2015-07-23 DIAGNOSIS — Z888 Allergy status to other drugs, medicaments and biological substances status: Secondary | ICD-10-CM | POA: Insufficient documentation

## 2015-07-23 DIAGNOSIS — Z79899 Other long term (current) drug therapy: Secondary | ICD-10-CM | POA: Diagnosis not present

## 2015-07-23 DIAGNOSIS — Z880 Allergy status to penicillin: Secondary | ICD-10-CM | POA: Insufficient documentation

## 2015-07-23 DIAGNOSIS — C329 Malignant neoplasm of larynx, unspecified: Secondary | ICD-10-CM | POA: Diagnosis not present

## 2015-07-23 DIAGNOSIS — B20 Human immunodeficiency virus [HIV] disease: Secondary | ICD-10-CM | POA: Insufficient documentation

## 2015-07-23 DIAGNOSIS — F319 Bipolar disorder, unspecified: Secondary | ICD-10-CM | POA: Insufficient documentation

## 2015-07-23 DIAGNOSIS — F17210 Nicotine dependence, cigarettes, uncomplicated: Secondary | ICD-10-CM | POA: Insufficient documentation

## 2015-07-23 HISTORY — DX: Unspecified viral hepatitis C without hepatic coma: B19.20

## 2015-07-23 MED ORDER — IOHEXOL 300 MG/ML  SOLN
75.0000 mL | Freq: Once | INTRAMUSCULAR | Status: AC | PRN
  Administered 2015-07-23: 75 mL via INTRAVENOUS

## 2015-07-23 NOTE — Telephone Encounter (Signed)
Called patient home and left voice message  Was sorry she missed her 8am appt with Dr. Babette Relic, please call [phone] to reschedule at your convience 8:52 AM

## 2015-07-26 ENCOUNTER — Telehealth: Payer: Self-pay | Admitting: *Deleted

## 2015-07-26 NOTE — Telephone Encounter (Addendum)
Patient aware of results. Patient wanted to know what her next step was. Chart reviewed and patient was a no-show to her radiation oncology appointment on 07/23/15. Reviewed this with patient and she stated she didn't know about appointment. Number to office given to patient and she will follow up with scheduling a new appointment.   ----- Message from Volanda Napoleon, MD sent at 07/23/2015  5:50 PM EDT ----- Call - NO obvious metastatic disease in the chest!!!  pete

## 2015-07-28 ENCOUNTER — Telehealth: Payer: Self-pay | Admitting: *Deleted

## 2015-07-28 NOTE — Telephone Encounter (Signed)
Patient called office stating she had breast cancer in her right breast per a mammogram she had last week. She asked if this office had received results. The office had not received results. Called [facility] and had results faxed to the office. Mammogram shows asymmetry in right breast which requires follow up with a diagnostic mammogram. Dr Marin Olp notified of results and results scanned into chart.

## 2015-07-30 ENCOUNTER — Telehealth: Payer: Self-pay | Admitting: *Deleted

## 2015-07-30 NOTE — Telephone Encounter (Signed)
  Oncology Nurse Navigator Documentation   Navigator Encounter Type: Introductory phone call (07/30/15 1658)      Placed introductory call to new referral patient. Introduced myself as the oncology nurse navigator that works with Dr. Isidore Moos and with whom he has an appt scheduled for next Friday at 0730/0800. I inquired of her availability to see Dr. Isidore Moos on Wed 8/17 in early afternoon with the intent of her being able to see Dory Peru, RD, and Polo Riley, LCSW.  She stated she believed that she could, needed to confirm the date of another appt. She understands I will call her on Monday to confirm appt change.  Gayleen Orem, RN, BSN, [place] at [place] [phone]

## 2015-08-02 ENCOUNTER — Telehealth: Payer: Self-pay | Admitting: *Deleted

## 2015-08-02 NOTE — Telephone Encounter (Signed)
  Oncology Nurse Navigator Documentation   Navigator Encounter Type: Telephone (08/02/15 1300)      Called patient in follow-up to our conversation last Friday. She stated she has a another MD appt on Wed and will not be able to attend [place]. I confirmed her understanding of:  0730 NE, 0800 consult with Dr. Isidore Moos.  I encouraged her to arrive by 0715.  [place] location, explained arrival, registration, and RadOnc Waiting procedures.  She verbalized understanding.  My attendance during the appt during which I will further discuss my role as her navigator. I noted that I will check to see if Dory Peru, RD, and Polo Riley, LCSW, are available to see her following her appt with Dr. Isidore Moos. I confirmed that she has my phone #, understands that she can contact me with questions/concerns prior to her Friday appt.   Gayleen Orem, RN, BSN, [place] at [place] [phone]

## 2015-08-06 ENCOUNTER — Encounter: Payer: Self-pay | Admitting: *Deleted

## 2015-08-06 ENCOUNTER — Ambulatory Visit
Admission: RE | Admit: 2015-08-06 | Discharge: 2015-08-06 | Disposition: A | Discharge status: Home / Self Care | Payer: Medicaid Other | Source: Ambulatory Visit | Attending: Radiation Oncology | Admitting: Radiation Oncology

## 2015-08-06 ENCOUNTER — Ambulatory Visit: Payer: Medicaid Other | Admitting: Nutrition

## 2015-08-06 ENCOUNTER — Encounter: Payer: Self-pay | Admitting: Radiation Oncology

## 2015-08-06 VITALS — BP 116/70 | HR 80 | Temp 97.6°F | Resp 20 | Ht 67.0 in | Wt 140.6 lb

## 2015-08-06 DIAGNOSIS — Z880 Allergy status to penicillin: Secondary | ICD-10-CM | POA: Diagnosis not present

## 2015-08-06 DIAGNOSIS — B1920 Unspecified viral hepatitis C without hepatic coma: Secondary | ICD-10-CM | POA: Diagnosis not present

## 2015-08-06 DIAGNOSIS — B20 Human immunodeficiency virus [HIV] disease: Secondary | ICD-10-CM | POA: Diagnosis not present

## 2015-08-06 DIAGNOSIS — F319 Bipolar disorder, unspecified: Secondary | ICD-10-CM | POA: Diagnosis not present

## 2015-08-06 DIAGNOSIS — C329 Malignant neoplasm of larynx, unspecified: Secondary | ICD-10-CM

## 2015-08-06 DIAGNOSIS — F17210 Nicotine dependence, cigarettes, uncomplicated: Secondary | ICD-10-CM | POA: Diagnosis not present

## 2015-08-06 DIAGNOSIS — Z888 Allergy status to other drugs, medicaments and biological substances status: Secondary | ICD-10-CM | POA: Diagnosis not present

## 2015-08-06 DIAGNOSIS — C321 Malignant neoplasm of supraglottis: Secondary | ICD-10-CM | POA: Diagnosis present

## 2015-08-06 DIAGNOSIS — R55 Syncope and collapse: Secondary | ICD-10-CM

## 2015-08-06 DIAGNOSIS — Z510 Encounter for antineoplastic radiation therapy: Secondary | ICD-10-CM | POA: Diagnosis present

## 2015-08-06 DIAGNOSIS — R413 Other amnesia: Secondary | ICD-10-CM

## 2015-08-06 MED ORDER — LARYNGOSCOPY SOLUTION RAD-ONC
15.0000 mL | Freq: Once | TOPICAL | Status: AC
Start: 2015-08-06 — End: 2015-08-06
  Administered 2015-08-06: 15 mL via TOPICAL
  Filled 2015-08-06: qty 15

## 2015-08-06 NOTE — Addendum Note (Signed)
Encounter addended by: Eppie Gibson, MD on: 08/06/2015  9:14 AM<BR>     Documentation filed: [facility] [employer] VN

## 2015-08-06 NOTE — Addendum Note (Signed)
Encounter addended by: Doreen Beam, RN on: 08/06/2015 10:07 AM<BR>     Documentation filed: Dx [employer], Charges VN, Orders

## 2015-08-06 NOTE — Addendum Note (Signed)
Encounter addended by: Doreen Beam, RN on: 08/06/2015 10:09 AM<BR>     Documentation filed: Inpatient MAR

## 2015-08-06 NOTE — Progress Notes (Signed)
Please see the Nurse Progress Note in the MD Initial Consult Encounter for this patient. 

## 2015-08-06 NOTE — Progress Notes (Signed)
Radiation Oncology         [phone] ________________________________  Initial Outpatient Consultation  Name: [name] MRN: [id number]  Date: 08/06/2015  DOB: 06/23/1966  RJ:JOACZYSA, Andree Elk, NP  Lorayne Bender, MD   REFERRING PHYSICIAN: Lorayne Bender, MD  DIAGNOSIS:    ICD-9-CM ICD-10-CM   1. 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 female who no-showed for her initial consult date with me earlier this month, but was able to attend today. She presented with a chronic right ear ache and painful lump in the right neck for months. CT scan of the neck on 12/15/14 revealed a right supraglottic mass which was at least 2.1 cm. This infiltrated the right peraglottic fat and upper epiglottis. There were bilateral cervical lymph node masses up to 4 cm on the right. Of note the patient also has HIV positivity and Hepatitis C. She saw Dr. Melven Sartorius at [facility] oncology. Further scans in the form of CT of the neck and chest were performed in January at [facility]. Chest CT showed a prominent R1 mediastinal lymph node. CT of neck confirmed previous findings and measured the right neck mass to be up to 6.2 cm in greatest dimension. This was felt to be multiple nodes in one mass. Biopsy of the larynx including the false right cord and lingual epiglottis were performed, squamous cell carcinoma, moderately differentiated was appreciated in the right false cord. PET/CT scan performed 01/21/15 demonstrated the supraglottic mass and interval decrease in size of the patient's necrotic cervical nodes with residual SUV avidity. It should be noted that the patient saw Dr. Tharon Aquas of radiation oncology at [facility] on 01/11/15. Agreement was stated with Dr. Horace Porteous plans for neo adjuvant chemotherapy.   Review of her records indicates that she had  received one cycle of carboplatin/pacletaxel at the end of January at [facility]. She then went on to receive 3 cycles of docetaxel and cisplatin and 5FU with Dr. Marin Olp at [facility]. Apparently her most recent cycle was in March. She saw Dr. Tharon Aquas again on 07/13/15. Laryngoscopy in his office revealed a grossly enlarged epiglottis and aryepiglottic fold. She did not have palpable adenopathy in her neck. Repeat PET/CT was recommended and she was referred to me for radiotherapy. PET/CT was denied due to insurance reasons. MRI of the neck was performed on 07/15/15 which showed edematous true vocal cords without obvious tumor in the throat. She had mild lymphadenopathy measuring up to 10 mm including bilateral pharyngeal nodes. CT of the chest with contrast on 07/23/15 showed no definite evidence of metastatic disease. She did have a faint ground glass 0.8 cm opacity in the right lower lobe, likely inflammatory.  The pt reports starting on Chantix a few days ago to quit smoking. She is also going to smoking cessation classes, but she is inquiring to see if classes are offered during the day and not evenings. She reports being able to swallow without difficulty. All of her teeth have been extracted at [facility] and she denies the current use of fluoride trays. She reports right occipital headaches occuring for about a month. She reports dizziness and 3 falls within the last few months. She also reports blacking out and short term memory loss. These started about three months ago. The pt is going to a pain clinic in [place]. She is  in the process of being referred to a pain clinic in [place].  PREVIOUS RADIATION THERAPY: No  PAST MEDICAL HISTORY:  has a past medical history of HIV (human immunodeficiency virus infection); HIV (human immunodeficiency virus infection); HCV (hepatitis C virus); Bipolar 1 disorder; and Cancer (12/31/2014).  PAST SURGICAL HISTORY: Past Surgical History  Procedure  Laterality Date  . Cesarean section    . Laryngoscopy  12/31/14    FAMILY HISTORY: family history is not on file.  SOCIAL HISTORY:  reports that she has been smoking Cigarettes.  She started smoking about 29 years ago. She has been smoking about 0.00 packs per day for the past 32 years. She has never used smokeless tobacco. She reports that she does not drink alcohol or use illicit drugs.  ALLERGIES: Ancef; Penicillin g; and Ciprofloxacin  MEDICATIONS:  Current Outpatient Prescriptions  Medication Sig Dispense Refill  . ascorbic acid (VITAMIN C) 500 MG tablet Take 500 mg by mouth daily.    . diazepam (VALIUM) 5 MG tablet Take 1 tablet (5 mg total) by mouth every 8 (eight) hours as needed for anxiety. 90 tablet 0  . diclofenac sodium (VOLTAREN) 1 % GEL Apply 2 g topically as needed.     . DULoxetine (CYMBALTA) 30 MG capsule Take 1 cap daily for one week, then increase to two caps daily.    . Emtricitab-Rilpivir-Tenofov DF (COMPLERA) 200-25-300 MG TABS TAKE 1 TABLET BY MOUTH EVERY DAY WITH FOOD    . ferrous fumarate (HEMOCYTE - 106 MG FE) 325 (106 FE) MG TABS tablet Take 1 tablet by mouth daily.    Marland Kitchen HYDROcodone-acetaminophen (NORCO) 10-325 MG per tablet Take 1 tablet by mouth every 6 (six) hours as needed.  0  . ibuprofen (ADVIL,MOTRIN) 600 MG tablet Take 600 mg by mouth as needed.    Derrill Memo ON 09/05/2015] methadone (DOLOPHINE) 10 MG tablet Take 10 mg by mouth every 6 (six) hours.    . pregabalin (LYRICA) 50 MG capsule Take 50 mg by mouth 3 (three) times daily.     . varenicline (CHANTIX) 0.5 MG tablet Take 1 tablet (0.5 mg total) by mouth 2 (two) times daily. Take one tablet daily for days 1-3. Then one tablet two times per day for days 4-7. Then start two tablets two times per day for remainder of therapy. 95 tablet 0  . VOLTAREN 1 % GEL APPLY 4 GM TO AFFECTED JOINTS QID PRN  2  . lidocaine (XYLOCAINE) 2 % solution Use as directed 15 mLs in the mouth or throat every 4 (four) hours as  needed. (Patient not taking: Reported on 08/06/2015) 100 mL 0  . Nutritional Supplements (ENSURE COMPLETE SHAKE) LIQD Take 1 Bottle by mouth 2 (two) times daily. (Patient not taking: Reported on 08/06/2015) 60 Bottle 12   No current facility-administered medications for this encounter.    REVIEW OF SYSTEMS:  Notable for that above.   PHYSICAL EXAM:  height is 5\' 7"  (1.702 m) and weight is 140 lb 9.6 oz (63.776 kg). Her oral temperature is 97.6 F (36.4 C). Her blood pressure is 116/70 and her pulse is 80. Her respiration is 20 and oxygen saturation is 100%.   General: Alert and oriented, in no acute distress HEENT: Head is normocephalic. Extraocular movements are intact. Edentulous. Mucous membranes are moist, no thrush, no oral lesions. Neck:  Inferior to the left angle of the mandible, a palpable mass is approximately 1.5 cm in greatest dimension. I cannot appreciate any more palpable cervical  or supraclavicular masses. Heart: Regular in rate and rhythm with no murmurs, rubs, or gallops. Chest: Clear to auscultation bilaterally, with no rhonchi, wheezes, or rales. Abdomen: Soft, nontender, nondistended, with no rigidity or guarding. Extremities: No cyanosis or edema. Lymphatics: see Neck Exam Skin: No concerning lesions. Musculoskeletal: symmetric strength and muscle tone throughout. Neurologic: Cranial nerves II through XII are grossly intact. No obvious focalities. Speech is fluent. Coordination is intact. Psychiatric: Judgment and insight are intact. Affect is appropriate.  PROCEDURE NOTE: After anesthetizing the nasal cavity with topical lidocaine and phenylephrine, the flexible endoscope was introduced and passed through the nasal cavity. She has some swelling and focal irregularity of the right aryepiglottic fold with more diffuse nodularity of the left aryepiglottic fold. The true vocal cords appear normal. Also, the nasopharynx and pharynx appear normal.  ECOG = 1  0 - Asymptomatic  (Fully active, able to carry on all predisease activities without restriction)  1 - Symptomatic but completely ambulatory (Restricted in physically strenuous activity but ambulatory and able to carry out work of a light or sedentary nature. For example, light housework, office work)  2 - Symptomatic, <50% in bed during the day (Ambulatory and capable of all self care but unable to carry out any work activities. Up and about more than 50% of waking hours)  3 - Symptomatic, >50% in bed, but not bedbound (Capable of only limited self-care, confined to bed or chair 50% or more of waking hours)  4 - Bedbound (Completely disabled. Cannot carry on any self-care. Totally confined to bed or chair)  5 - Death   Eustace Pen MM, Creech RH, Tormey DC, et al. [phone]). "Toxicity and response criteria of the [facility]". [place] Oncol. 5 (6): 649-55   LABORATORY DATA:  Lab Results  Component Value Date   WBC 8.1 07/15/2015   HGB 12.2 07/15/2015   HCT 36.0 07/15/2015   MCV 90.3 07/15/2015   PLT 236 07/15/2015   CMP     Component Value Date/Time   NA 138 07/15/2015 0156   NA 140 07/14/2015 1022   NA 140 03/08/2015 0832   K 3.9 07/15/2015 0156   K 3.9 07/14/2015 1022   K 3.9 03/08/2015 0832   CL 100* 07/15/2015 0156   CL 102 03/08/2015 0832   CO2 23 07/15/2015 0113   CO2 26 07/14/2015 1022   CO2 30 03/08/2015 0832   GLUCOSE 98 07/15/2015 0156   GLUCOSE 100 07/14/2015 1022   GLUCOSE 102 03/08/2015 0832   BUN 12 07/15/2015 0156   BUN 9.6 07/14/2015 1022   BUN 7 03/08/2015 0832   CREATININE 0.80 07/15/2015 0156   CREATININE 0.8 07/14/2015 1022   CREATININE 0.6 03/08/2015 0832   CALCIUM 9.2 07/15/2015 0113   CALCIUM 10.0 07/14/2015 1022   CALCIUM 9.6 03/08/2015 0832   PROT 7.6 07/14/2015 1022   PROT 7.3 03/08/2015 0832   PROT 5.8* 02/04/2015 1116   ALBUMIN 4.2 07/14/2015 1022   ALBUMIN 3.3* 02/04/2015 1116   AST 25 07/14/2015 1022   AST 28 03/08/2015 0832   AST  15 02/04/2015 1116   ALT 23 07/14/2015 1022   ALT 19 03/08/2015 0832   ALT 11 02/04/2015 1116   ALKPHOS 93 07/14/2015 1022   ALKPHOS 95* 03/08/2015 0832   ALKPHOS 126* 02/04/2015 1116   BILITOT 0.39 07/14/2015 1022   BILITOT 0.50 03/08/2015 0832   BILITOT 0.2 02/04/2015 1116   GFRNONAA >60 07/15/2015 0113   GFRAA >60 07/15/2015 0113  Lab Results  Component Value Date   TSH 0.220* 01/18/2011       RADIOGRAPHY: Dg Chest 2 View  07/15/2015   CLINICAL DATA:  Cough and sore throat today.  EXAM: CHEST  2 VIEW  COMPARISON:  11/11/2014  FINDINGS: Tip of the right chest port in the mid SVC. The cardiomediastinal contours are normal. The lungs are clear. Pulmonary vasculature is normal. No consolidation, pleural effusion, or pneumothorax. No acute osseous abnormalities are seen.  IMPRESSION: No acute pulmonary process.   Electronically Signed   By: Jeb Levering M.D.   On: 07/15/2015 01:38   Ct Chest W Contrast  07/23/2015   CLINICAL DATA:  49 year old female with laryngeal cancer diagnosed in January 2016, with ongoing chemotherapy.  EXAM: CT CHEST WITH CONTRAST  TECHNIQUE: Multidetector CT imaging of the chest was performed during intravenous contrast administration.  CONTRAST:  32mL OMNIPAQUE IOHEXOL 300 MG/ML  SOLN  COMPARISON:  11/14/2012 coronary CT angiogram. 09/15/2013 CT abdomen/ pelvis. 07/15/2015 chest radiograph. No prior diagnostic chest CT.  FINDINGS: Mediastinum/Nodes: Normal heart size. No pericardial fluid/thickening. Right internal jugular MediPort terminates at the cavoatrial junction. Great vessels are normal in course and caliber. No central pulmonary emboli. There is a tiny calcified 0.6 cm posterior left thyroid lobe nodule. There is a heterogeneous hypodense 1.0 cm posterior right thyroid lobe nodule. Normal esophagus. There are no pathologically enlarged axillary, mediastinal or hilar lymph nodes. There are a few top-normal size thoracic nodes, including a 0.8 cm short axis  rounded right axillary node (series 2/image 16) and a 0.9 cm right hilar node (2/27).  Lungs/Pleura: No pneumothorax. No pleural effusion. Mild hypoventilatory changes in the dependent lower lobes. There is a solid 4 mm right middle lobe pulmonary nodule (5/40), unchanged since 05/25/08 CT abdomen study in keeping with benign etiology. There is subsegmental scarring versus atelectasis in the medial segment right middle lobe. A 0.3 cm subpleural right lower lobe solid pulmonary nodule (5/44) is also stable since 2009, in keeping with benign etiology. There is a new faint ground-glass 0.8 cm nodular opacity in the right lower lobe (5/41). A 0.3 cm subpleural anterior left lower lobe pulmonary nodule (5/30) is stable since 11/14/2012. No acute consolidative airspace disease.  Upper abdomen: There is a 0.5 cm right liver lobe hypodense lesion (2/65), unchanged since 09/15/2013, probably benign. Stable coarse calcification along the posterior right liver lobe capsule, likely posttraumatic or from prior granulomatous disease. Otherwise unremarkable visualized upper abdomen.  Musculoskeletal: Mild degenerative changes in the thoracic spine. No suspicious focal osseous lesions.  IMPRESSION: 1. No definite evidence of metastatic disease in the chest. 2. Faint ground-glass 0.8 cm right lower lobe nodular opacity, likely inflammatory. Recommend follow-up chest CT in 3 months. This recommendation follows the consensus statement: Recommendations for the Management of Subsolid Pulmonary Nodules Detected at CT: A Statement from the [place]. Radiology [id number].   Electronically Signed   By: Ilona Sorrel M.D.   On: 07/23/2015 16:40   Mr Neck Soft Tissue Only W Wo Contrast  07/15/2015   CLINICAL DATA:  Immunocompromised patient with recent diagnosis of laryngeal cancer, status post spot endoscopy yesterday. Fever.  EXAM: MRI OF THE NECK WITHOUT CONTRAST  TECHNIQUE: Multiplanar, multisequence MR imaging of the neck  was performed. No intravenous contrast was administered.  COMPARISON:  None.  FINDINGS: Apposition of the true vocal cords, with symmetric, mildly edematous appearance of the true cords, no discrete nodule/submucosal lesion. Normal appearance of the pharynx.  6 mm bilateral pharyngeal  lymph node. 10 mm LEFT level IIa lymph node, 10 mm RIGHT level IIa lymph node.  Normal appearance of the major salivary glands. Normal appearance of the thyroid gland. Normal cervical vessel flow voids.  No abnormal osseous signal nor enhancement. Moderate C5-6 disc height loss, endplate spurring consistent with degenerative disc. RIGHT C5-6 subarticular disc protrusion results in apparent severe RIGHT C5-6 neural foraminal narrowing. Mild canal stenosis at C5-6.  Included paranasal sinuses and mastoid air cells are well aerated.  IMPRESSION: Edematous appearing true vocal cords without discrete nodule or submucosal mass.  Mild lymphadenopathy including small lateral pharyngeal lymph nodes.   Electronically Signed   By: Elon Alas M.D.   On: 07/15/2015 03:42      IMPRESSION/PLAN:  This is a delightful patient with head and neck cancer, status post induction chemotherapy. I do recommend radiotherapy for this patient.  We discussed the potential risks, benefits, and side effects of radiotherapy. We talked in detail about acute and late effects. We discussed that some of the most bothersome acute effects may be mucositis, dysgeusia, salivary changes, skin irritation, hair loss, dehydration, weight loss and fatigue. We talked about late effects which include but are not necessarily limited to dysphagia, hypothyroidism, nerve injury, spinal cord injury, xerostomia, trismus, and neck edema. No guarantees of treatment were given. A consent form was signed and placed in the patient's medical record. The patient is enthusiastic about proceeding with treatment. I look forward to participating in the patient's care. The patient  signed a consent form and this was placed in her medical chart.  Due to the patient's frequent blackouts, I have encouraged the patient to discontinue driving. Refer to neurology. The patient would like her radiotherapy to be scheduled in the mornings.  Simulation (treatment planning) will take place Monday, August 22 at [facility].  We also discussed that the treatment of head and neck cancer is a multidisciplinary process to maximize treatment outcomes and quality of life. For this reasons the following referrals have been or will be made:  Nutritionist for nutrition support during and after treatment.  Speech language pathology for swallowing and/or speech therapy.  Social work for social support.   Physical therapy due to risk of lymphedema in neck and deconditioning.  Baseline labs including TSH.  Neurology due to the patient's frequent blackouts.  The patient continues to use tobacco. The patient was counseled to stop using tobacco and was offered counseling to help with this. The patient will look into counseling at [facility] outpatient clinic at this time and continue Chantix as prescribed.   This document serves as a record of services personally performed by Eppie Gibson, MD. It was created on her behalf by Darcus Austin, a trained medical scribe. The creation of this record is based on the scribe's personal observations and the provider's statements to them. This document has been checked and approved by the attending provider.     __________________________________________   Eppie Gibson, MD

## 2015-08-06 NOTE — Progress Notes (Addendum)
Pain Status: {Pain rating: 4/10 in knees BP 116/70 mmHg  Pulse 80  Temp(Src) 97.6 F (36.4 C) (Oral)  Resp 20  Ht 5\' 7"  (1.702 m)  Wt 140 lb 9.6 oz (63.776 kg)  BMI 22.02 kg/m2  SpO2 100%   Nutritional Status a) intake: soft diet, no difficult swallowing or chewing now c) weight changes, if any:  gained Wt Readings from Last 3 Encounters:  08/06/15 140 lb 9.6 oz (63.776 kg)  07/14/15 132 lb (59.875 kg)  04/15/15 112 lb (50.803 kg)    Swallowing Status: Pt no difficulty  Smoking or chewing tobacco? 2 -4 cigarettes daily restarted 1-2 months ago, is taking  chantix  Again,trying to stop,wants to take smoking cessation clas here  Dental (if applicable): When was last visit with dentistry   May 2016,  Dental clinic at [place],  Using fluoride trays daily? Yes on top    When was last ENT visit? January 2016 in hospital   When is next ENT visit? None scheduled  Last telephone call 07/28/15 from RN Dr. Dicie Beam' office  Mammogram shows asymmetry in right breast which requires follow up with a diagnostic mammogram. Dr Marin Olp notified of results and results scanned into chart.         Imaging done in the last month (if applicable) revealed: states laryngoscope with [facility] Leroy Libman at [facility]  In July 2016  Other notable issues, if any: MR neck soft tissue 07/15/15, CT chest and cxray 07/23/15 Having lots of chills,night sweats, diarrhea and gas

## 2015-08-06 NOTE — Addendum Note (Signed)
Encounter addended by: Norm Salt, RN on: 08/06/2015  4:56 PM<BR>     Documentation filed: Dx Association, Charges VN, Orders

## 2015-08-06 NOTE — Progress Notes (Signed)
  Oncology Nurse Navigator Documentation   Navigator Encounter Type: Initial RadOnc (08/06/15 0750)     Barriers/Navigation Needs: No barriers at this time (08/06/15 0750)     Met with patient during initial consult with Dr. Isidore Moos.    1. Further introduced myself as her Navigator, explained my role as a member of the Care Team.   2. She indicated she is living with her dtr, has a second dtr and son who are also available for support while she receives tmt. 3. Provided New Patient Information packet, discussed contents:  Contact information for physician(s), myself, other members of the Care Team.  Advance Directive information ([place] blue pamphlet with LCSW contact info)  Fall Prevention Patient Safety Plan  Appointment Guideline  ACS Referral form  [place] campus map with highlight of [place] 4. Provided introductory explanation of radiation treatment including SIM planning and fitting of Aquaplast head and shoulder mask, showed example of mask.   5. Set up / prepared pt for laryngoscopy.  6. Provided photos/diagrams of feeding tube, explained purpose.  7. Provided information/discussed opportunities for smoking cessation support:    QuitSmart class/individual counseling at [facility] [place]  ACS pamphlets "Set Yourself Free - Deciding how to quit:  A smoker's guide" and "Smart Move - A Stop Smoking Guide".  8. Provided a tour of SIM and [place] areas, explained treatment and arrival procedures.    She verbalized understanding CT SIM is scheduled for next Monday 8/22 2:15.  She was give appt card with this information. 9. Escorted her to appt with Dory Peru, RD. 10. I encouraged her to contact me with questions/concerns as treatments/procedures begin.  She verbalized understanding of information provided, understands she can contact me with questions/concerns.     Gayleen Orem, RN, BSN, [place] at [place] [phone]

## 2015-08-06 NOTE — Progress Notes (Signed)
49 year old female diagnosed with cancer of the supraglottis.  She is status post chemotherapy with Dr. Marin Olp and to begin radiation therapy.  Past medical history includes HIV, bipolar, hepatitis C, tobacco.  Medications include Valium and Cymbalta.  Labs were reviewed.  Height: 67 inches. Weight: 140.6 pounds. BMI: 20.02.  Patient reports she has difficulty chewing secondary to not having lower teeth. Patient reports good appetite and weight gain after chemotherapy. Patient is aware of potential side effects from radiation treatments. Patient able to verbalize high-protein foods and is able to alter textures for improved tolerance.  Nutrition diagnosis:  Predicted suboptimal energy intake related to radiation therapy for supraglottal Cancer as evidenced by history or presence of a condition for which research shows an increased incidence of suboptimal energy intake.  Intervention:  Patient educated to consume small frequent meals with protein and high calorie foods to promote weight maintenance. Recommended patient consume oral nutrition supplements beginning with one daily. Provided samples of ensure and boost and Carnation breakfast along with coupons. Educated patient on strategies for consuming soft moist foods and provided fact sheets. Questions were answered.  Teach back method used.  Monitoring, evaluation, goals: Patient will tolerate increased oral intake to minimize weight loss.  Next visit: To be scheduled weekly with treatment.  **Disclaimer: This note was dictated with voice recognition software. Similar sounding words can inadvertently be transcribed and this note may contain transcription errors which may not have been corrected upon publication of note.**

## 2015-08-06 NOTE — Addendum Note (Signed)
Encounter addended by: Shirlean Mylar, [place] on: 08/06/2015 10:06 AM<BR>     Documentation filed: Rx Order Verification

## 2015-08-09 ENCOUNTER — Ambulatory Visit: Payer: Medicaid Other

## 2015-08-09 ENCOUNTER — Ambulatory Visit: Payer: Medicaid Other | Admitting: Radiation Oncology

## 2015-08-09 ENCOUNTER — Ambulatory Visit: Payer: No Typology Code available for payment source

## 2015-08-09 NOTE — Progress Notes (Signed)
[place] Work  Clinical Social Work was referred by head and neck navigator for assessment of psychosocial needs.  Clinical Social Worker met with patient in [place] office to offer support and assess for needs.  Ms. Appleman shared she is feeling optimistic about her treatment and is confident in her healthcare team.  She is currently living with her daughter and feels she has adequate support at this time.  Patient's only concern at this time was transportation- the patient is already signed up for DSS Medicaid transportation, CSW also provided information on ACS road to recovery program.  CSW encouraged patient to call with any additional questions or concerns.   Polo Riley, MSW, LCSW, OSW-C Clinical Social Worker [facility] [phone]

## 2015-08-12 ENCOUNTER — Telehealth: Payer: Self-pay | Admitting: *Deleted

## 2015-08-12 NOTE — Telephone Encounter (Signed)
  Oncology Nurse Navigator Documentation    Navigator Encounter Type: Telephone (08/12/15 1656)                      Time Spent with Patient: 15 (08/12/15 1656)    Called patient to remind her of tomorrow's 11:15 lab, 12:15 IV Start, 1:00 SIM appts.   I encouraged her to arrive to [facility] at 11:00.  She verbalized understanding.  Gayleen Orem, RN, BSN, [place] at [place] [phone]

## 2015-08-13 ENCOUNTER — Encounter: Payer: Self-pay | Admitting: *Deleted

## 2015-08-13 ENCOUNTER — Ambulatory Visit
Admission: RE | Admit: 2015-08-13 | Discharge: 2015-08-13 | Disposition: A | Discharge status: Home / Self Care | Payer: Medicaid Other | Source: Ambulatory Visit | Attending: Radiation Oncology | Admitting: Radiation Oncology

## 2015-08-13 ENCOUNTER — Ambulatory Visit
Admission: RE | Admit: 2015-08-13 | Discharge: 2015-08-13 | Disposition: A | Discharge status: Home / Self Care | Payer: No Typology Code available for payment source | Source: Ambulatory Visit | Attending: Radiation Oncology | Admitting: Radiation Oncology

## 2015-08-13 VITALS — BP 127/75 | HR 57 | Temp 97.7°F | Resp 16 | Ht 67.0 in | Wt 143.2 lb

## 2015-08-13 DIAGNOSIS — C321 Malignant neoplasm of supraglottis: Secondary | ICD-10-CM

## 2015-08-13 DIAGNOSIS — Z510 Encounter for antineoplastic radiation therapy: Secondary | ICD-10-CM | POA: Diagnosis not present

## 2015-08-13 LAB — TSH CHCC: TSH: 0.666 m(IU)/L (ref 0.308–3.960)

## 2015-08-13 MED ORDER — HEPARIN SOD (PORK) LOCK FLUSH 100 UNIT/ML IV SOLN
500.0000 [IU] | Freq: Once | INTRAVENOUS | Status: AC
  Administered 2015-08-13: 500 [IU] via INTRAVENOUS

## 2015-08-13 MED ORDER — SODIUM CHLORIDE 0.9 % IJ SOLN
10.0000 mL | Freq: Once | INTRAMUSCULAR | Status: AC
Start: 2015-08-13 — End: 2015-08-13
  Administered 2015-08-13: 10 mL via INTRAVENOUS

## 2015-08-13 NOTE — Progress Notes (Signed)
Received patient in the clinic following simulation. Flushed power port access with saline then, heparin per protocol. Removed access needle. Needle intact upon removal. Applied a bandaid to old access site. Patient tolerated well. Discharged patient home with family member.

## 2015-08-13 NOTE — Progress Notes (Signed)
  Oncology Nurse Navigator Documentation   Navigator Encounter Type: Other (08/13/15 1230) Patient Visit Type: Radonc (08/13/15 1230) Treatment Phase: CT SIM (08/13/15 1230)     To provide support and encouragement, care continuity, met with patient during CT SIM appt.  She was accompanied by her future d-in-law. I reviewed the purpose of CT SIM, we talked about the probable start date of 9/7 for tomo tmt.  She indicated she was hoping to go the beach 9/14-18, I educated on importance of maintaining tmt schedule. She verbalized understanding. I again showed her tomo area, explained registration/arrival process. She understands I can be contacted with needs/concerns.  Gayleen Orem, RN, BSN, [place] at [place] [phone]                  Time Spent with Patient: 60 (08/13/15 1230)

## 2015-08-13 NOTE — Progress Notes (Signed)
Head and Neck Cancer Simulation, IMRT treatment planning note   Outpatient  Diagnosis:    ICD-9-CM ICD-10-CM   1. 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 An Aquaplast head and shoulder mask was custom fitted to the patient's anatomy. High-resolution CT axial imaging was obtained of the head and neck with contrast. I verified that the quality of the imaging is good for treatment planning. 1 Medically Necessary Treatment Device was fabricated and supervised by me: Aquaplast mask.   Treatment planning note I plan to treat the patient with IMRT. I plan to treat the patient's tumor and bilateral neck nodes. I plan to treat to a total dose of 70 Gray in 35  fractions. Dose calculation was ordered from dosimetry.  IMRT planning Note  IMRT is an important modality to deliver adequate dose to the patient's at risk tissues while sparing the patient's normal structures, including the: esophagus, parotid tissue, mandible, brain stem, spinal cord, oral cavity, brachial plexus.  This justifies the use of IMRT in the patient's treatment.    -----------------------------------  Eppie Gibson, MD

## 2015-08-13 NOTE — Progress Notes (Signed)
[name] here for IV start for CT SIm.  She denies any allergies or problems with IV contrast dye.  She does not take metformin.  Patient applied ice to her right chest power port site.  Accessed right chest port a cath.  Blood return noted.  Flushed with 10 cc normal saline.  Covered site with tegaderm.  Patient tolerated well.

## 2015-08-17 NOTE — Addendum Note (Signed)
Encounter addended by: Eppie Gibson, MD on: 08/17/2015  3:46 PM<BR>     Documentation filed: Clinical Notes

## 2015-08-18 ENCOUNTER — Ambulatory Visit: Payer: Medicaid Other | Attending: Radiation Oncology | Admitting: Physical Therapy

## 2015-08-18 DIAGNOSIS — Z510 Encounter for antineoplastic radiation therapy: Secondary | ICD-10-CM | POA: Diagnosis not present

## 2015-08-25 ENCOUNTER — Ambulatory Visit
Admission: RE | Admit: 2015-08-25 | Discharge: 2015-08-25 | Disposition: A | Discharge status: Home / Self Care | Payer: No Typology Code available for payment source | Source: Ambulatory Visit | Attending: Radiation Oncology | Admitting: Radiation Oncology

## 2015-08-25 ENCOUNTER — Encounter: Payer: Self-pay | Admitting: Radiation Oncology

## 2015-08-25 ENCOUNTER — Ambulatory Visit
Admission: RE | Admit: 2015-08-25 | Discharge: 2015-08-25 | Disposition: A | Discharge status: Home / Self Care | Payer: Medicaid Other | Source: Ambulatory Visit | Attending: Radiation Oncology | Admitting: Radiation Oncology

## 2015-08-25 ENCOUNTER — Ambulatory Visit: Payer: Medicaid Other | Attending: Radiation Oncology | Admitting: Physical Therapy

## 2015-08-25 ENCOUNTER — Encounter: Payer: Self-pay | Admitting: *Deleted

## 2015-08-25 VITALS — BP 115/38 | HR 75 | Temp 98.2°F | Resp 12 | Wt 138.3 lb

## 2015-08-25 DIAGNOSIS — M542 Cervicalgia: Secondary | ICD-10-CM | POA: Diagnosis present

## 2015-08-25 DIAGNOSIS — M436 Torticollis: Secondary | ICD-10-CM | POA: Diagnosis present

## 2015-08-25 DIAGNOSIS — Z9189 Other specified personal risk factors, not elsewhere classified: Secondary | ICD-10-CM

## 2015-08-25 DIAGNOSIS — C321 Malignant neoplasm of supraglottis: Secondary | ICD-10-CM

## 2015-08-25 DIAGNOSIS — Z510 Encounter for antineoplastic radiation therapy: Secondary | ICD-10-CM | POA: Diagnosis not present

## 2015-08-25 NOTE — Progress Notes (Signed)
PAIN: She is currently in no pain.  SWALLOWING/DIET: Pt denies dysphagia. Pt reports a regular unmodified diet orally- formula- Ensure/Boost/Carnation once a day cans per day. Oral exam reveals mucous membranes moist. Occasional yellow thick sputum.  BOWEL: Pt reports Nausea and Diarrhea-occasionally- she thinks from diarrhea.   SKIN: Skin exam reveals warm dry and intact. OTHER: Pt complains of fatigue, weakness, loss of sleep and poor appetite.   WEIGHT/VS: Wt Readings from Last 3 Encounters:  08/25/15 138 lb 4.8 oz (62.732 kg)  08/13/15 143 lb 3.2 oz (64.955 kg)  08/06/15 140 lb 9.6 oz (63.776 kg)   BP 115/38 mmHg  Pulse 75  Temp(Src) 98.2 F (36.8 C) (Oral)  Resp 12  Wt 138 lb 4.8 oz (62.732 kg)  SpO2 100%   Orthostatic Standing Vital Signs: BP: 108/79 P:92 Pox:99%
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
## 2015-08-25 NOTE — Progress Notes (Signed)
   Weekly Management Note:  Outpatient    ICD-9-CM ICD-10-CM   1. 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  CBCT/MVCT images/Port film x-rays were reviewed.  The chart was checked. No new complaints  Physical Findings:  Wt Readings from Last 3 Encounters:  08/25/15 138 lb 4.8 oz (62.732 kg)  08/13/15 143 lb 3.2 oz (64.955 kg)  08/06/15 140 lb 9.6 oz (63.776 kg)    weight is 138 lb 4.8 oz (62.732 kg). Her oral temperature is 98.2 F (36.8 C). Her blood pressure is 115/38 and her pulse is 75. Her respiration is 12 and oxygen saturation is 100%.  NAD  CBC    Component Value Date/Time   WBC 8.1 07/15/2015 0113   WBC 7.6 07/14/2015 1021   RBC 3.92 07/15/2015 0113   RBC 4.21 07/14/2015 1021   HGB 12.2 07/15/2015 0156   HGB 13.2 07/14/2015 1021   HCT 36.0 07/15/2015 0156   HCT 38.2 07/14/2015 1021   PLT 236 07/15/2015 0113   PLT 253 07/14/2015 1021   MCV 90.3 07/15/2015 0113   MCV 91 07/14/2015 1021   MCH 31.1 07/15/2015 0113   MCH 31.4 07/14/2015 1021   MCHC 34.5 07/15/2015 0113   MCHC 34.6 07/14/2015 1021   RDW 13.7 07/15/2015 0113   RDW 13.5 07/14/2015 1021   LYMPHSABS 4.2* 07/15/2015 0113   LYMPHSABS 2.6 07/14/2015 1021   MONOABS 0.9 07/15/2015 0113   EOSABS 0.2 07/15/2015 0113   EOSABS 0.2 07/14/2015 1021   BASOSABS 0.0 07/15/2015 0113   BASOSABS 0.0 07/14/2015 1021     CMP     Component Value Date/Time   NA 138 07/15/2015 0156   NA 140 07/14/2015 1022   NA 140 03/08/2015 0832   K 3.9 07/15/2015 0156   K 3.9 07/14/2015 1022   K 3.9 03/08/2015 0832   CL 100* 07/15/2015 0156   CL 102 03/08/2015 0832   CO2 23 07/15/2015 0113   CO2 26 07/14/2015 1022   CO2 30 03/08/2015 0832   GLUCOSE 98 07/15/2015 0156   GLUCOSE 100 07/14/2015 1022   GLUCOSE 102 03/08/2015 0832   BUN 12 07/15/2015 0156   BUN 9.6 07/14/2015 1022   BUN 7  03/08/2015 0832   CREATININE 0.80 07/15/2015 0156   CREATININE 0.8 07/14/2015 1022   CREATININE 0.6 03/08/2015 0832   CALCIUM 9.2 07/15/2015 0113   CALCIUM 10.0 07/14/2015 1022   CALCIUM 9.6 03/08/2015 0832   PROT 7.6 07/14/2015 1022   PROT 7.3 03/08/2015 0832   PROT 5.8* 02/04/2015 1116   ALBUMIN 4.2 07/14/2015 1022   ALBUMIN 3.3* 02/04/2015 1116   AST 25 07/14/2015 1022   AST 28 03/08/2015 0832   AST 15 02/04/2015 1116   ALT 23 07/14/2015 1022   ALT 19 03/08/2015 0832   ALT 11 02/04/2015 1116   ALKPHOS 93 07/14/2015 1022   ALKPHOS 95* 03/08/2015 0832   ALKPHOS 126* 02/04/2015 1116   BILITOT 0.39 07/14/2015 1022   BILITOT 0.50 03/08/2015 0832   BILITOT 0.2 02/04/2015 1116   GFRNONAA >60 07/15/2015 0113   GFRAA >60 07/15/2015 0113     Impression:  The patient is tolerating radiotherapy.   Plan:  Continue radiotherapy as planned.    -----------------------------------  Eppie Gibson, MD

## 2015-08-25 NOTE — Progress Notes (Signed)
IMRT Device Note    ICD-9-CM ICD-10-CM   1. 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 The code is [phone].  -----------------------------------  Eppie Gibson, MD

## 2015-08-25 NOTE — Progress Notes (Signed)
SKIN CARE DURING RADIATION TREATMENT-HEAD AND NECK  RECOMMENDATIONS: ? Use unscented soap (Dove) ? When showering it is fine for water to touch the area, but please avoid direct spray on the treatment field.  Also, wash inside and around the marked area ? When drying gently blot the area ? Avoid using lotions, oils or powders as well as products with alcohol ? If you shave, use an electric razor and DO NOT use pre or after shave lotion  ? Moisturizer o You may be given Radiaplex Gel or Biafine (provided by nursing) to use. Apply twice daily, once after treatment and then again prior to bedtime o Your Radiation Oncologist may suggest other skin care products ? PLEASE DO NOT APPLY ANYTHING TO THE TREATMENT AREA SKIN WITH 4 HOURS  PRIOR TO RADIATION  ? Mouth care o Soothing relief: rinse your mouth every 1-2 hours with a solution of  teaspoon baking soda and 1/8 teaspoon salt mixed in 1 cup of warm water o DO NOT use mouthwashes that contain alcohol, try using BIOTENE instead  Managing Acute Radiation Side Effects for Head and Neck Cancer  Skin irritation:  . Biafine  Topical Emulsion: First-line topical cream to help soothe skin irritation.  Apply to skin in radiation fields at least 4 hours before radiotherapy, or any time after treatments during the rest of the day.  . Triple Antibiotic Ointment (Neosporin): Apply to areas of skin with moist breakdown to prevent infection.  . 1% hydrocortisone cream: Apply to areas of skin that are itching, up to three times a day.  . Silvadene (Silver Sulfadiazine): Used in select cases if large patches of skin develop moist breakdown (let physician or nurse know if you have a "sulfa" drug allergy)  Soreness in mouth or throat: . Baking Soda Rinse: a home remedy to soothe/cleanse mouth and loosen thick saliva.  Mix 1/2 teaspoon salt, 1/2 teaspoon baking soda, 1 pint water (16 oz or two cups).  Swish, gargle and spit as needed to soothe/cleanse  mouth. Use as often as you want.  . Sucralfate (Carafate): coats throat to soothe it before meals or any time of day. Crush 1 tablet in 10 mL H20 and swallow up to four times a day.  . 2% viscous Lidocaine (Magic Mouth Wash): Soothes mouth and/or throat by numbing your mucous membranes. Mix 1 part 2% viscous lidocaine (Magic Mouth Wash), 1 part H20. Swish and/or swallow 10mL of this mixture, 30min before meals and at bedtime, up to four times a day. Alternate with Sucralfate (Carafate).  . Narcotics: Various short acting and long acting narcotics can be prescribed.  Often, medical oncology will prescribe these if you are receiving chemotherapy concurrently. Narcotics may cause constipation. It may be helpful to take a stool softener (Docusate Sodium) or gentle laxative (ie Senna or Polyethylene Glycol) to prevent constipation.  Having food in your stomach before ingesting a narcotic may reduce risk of stomach upset.  Thick Saliva: . Baking Soda Rinse: a home remedy to soothe/cleanse mouth and loosen thick saliva.  Mix 1/2 teaspoon salt, 1/2 teaspoon baking soda, 1 pint water (16 oz or two cups).  Swish, gargle, and spit as needed to soothe/cleanse mouth. Use as often as you want.  . Some patients find Diet Ginger Ale or Papaya Juice to be helpful.  . In extreme cases, your physician may consider prescribing a Scopolamine transdermal patch which dries up your saliva.     Poor taste, or lack of taste:   .   There are no well-established medications to combat taste bud changes from radiotherapy.  It often takes weeks to months to regain taste function.  Eating bland foods and drinking nutritional shakes  may help you maintain your weight when food is not enjoyable.  Some patients supplement their oral intake with a feeding tube.  Fatigue and weakness: . There is not a well-established safe and effective medication to combat radiation-induced fatigue.  However, if you are able to perform light exercise  (such as a daily walk, yoga, recumbent stationary bicycling), this may combat fatigue and help you maintain muscle mass during treatment.  . Maintaining hydration and nutrition are also important.  If you have not been referred to a nutritionist and would like a referral, please let your nurse or physician know.  . Try to get at least 8 hours of sleep each night. You may need a daily nap, but try not to nap so late that it interferes with your nightly sleep schedule. 

## 2015-08-26 ENCOUNTER — Ambulatory Visit
Admission: RE | Admit: 2015-08-26 | Discharge: 2015-08-26 | Disposition: A | Discharge status: Home / Self Care | Payer: Medicaid Other | Source: Ambulatory Visit | Attending: Radiation Oncology | Admitting: Radiation Oncology

## 2015-08-26 DIAGNOSIS — Z510 Encounter for antineoplastic radiation therapy: Secondary | ICD-10-CM | POA: Diagnosis not present

## 2015-08-26 NOTE — Progress Notes (Signed)
  Oncology Nurse Navigator Documentation   Navigator Encounter Type: Clinic/MDC (08/26/15 1600) Patient Visit Type: Radonc (08/26/15 1600) Treatment Phase: First Radiation Tx (08/26/15 1600)     To provide support and encouragement, care continuity and to assess for needs, met with Ms. Mello during WUT with Dr. Isidore Moos and afterwards during first Tomo tmt.  I reviewed with her the arrival and treatment procedures, she verbalized understanding. She tolerated first Tomo wo/ difficulty. She understands I can be contacted with needs/concerns.  Gayleen Orem, RN, BSN, [place] at [employer] [phone]                  Time Spent with Patient: 75 (08/26/15 1600)

## 2015-08-27 ENCOUNTER — Other Ambulatory Visit: Payer: Self-pay | Admitting: Hematology & Oncology

## 2015-08-27 ENCOUNTER — Ambulatory Visit
Admission: RE | Admit: 2015-08-27 | Discharge: 2015-08-27 | Disposition: A | Discharge status: Home / Self Care | Payer: Medicaid Other | Source: Ambulatory Visit | Attending: Radiation Oncology | Admitting: Radiation Oncology

## 2015-08-27 ENCOUNTER — Other Ambulatory Visit: Payer: Self-pay | Admitting: *Deleted

## 2015-08-27 DIAGNOSIS — F419 Anxiety disorder, unspecified: Secondary | ICD-10-CM

## 2015-08-27 DIAGNOSIS — Z510 Encounter for antineoplastic radiation therapy: Secondary | ICD-10-CM | POA: Diagnosis not present

## 2015-08-27 DIAGNOSIS — C329 Malignant neoplasm of larynx, unspecified: Secondary | ICD-10-CM

## 2015-08-27 DIAGNOSIS — C321 Malignant neoplasm of supraglottis: Secondary | ICD-10-CM

## 2015-08-27 MED ORDER — ENSURE COMPLETE SHAKE PO LIQD: 1.0000 | Freq: Two times a day (BID) | ORAL | Status: DC

## 2015-08-27 MED ORDER — BIAFINE EX EMUL
CUTANEOUS | Status: DC | PRN
  Administered 2015-08-27: 14:00:00 via TOPICAL

## 2015-08-27 MED ORDER — DIAZEPAM 5 MG PO TABS: 5.0000 mg | ORAL_TABLET | Freq: Three times a day (TID) | ORAL | Status: DC | PRN

## 2015-08-27 NOTE — Progress Notes (Signed)
Radiation Therapy and You Handbook has been given as well as biafine and radiation side effects sheet for head/neck cancer.Reviewed clinic routine a weekly appointment with doctor.Knows to apply biafine twice daily after treatmnet and at bedtime.Oral care after meals.Possible side effects to include fatigue, sore throat, thick saliva, pain on swallowing and skin changes.Information to be reinforced weekly as well as medications.

## 2015-08-30 ENCOUNTER — Encounter: Payer: Self-pay | Admitting: Radiation Oncology

## 2015-08-30 ENCOUNTER — Ambulatory Visit
Admission: RE | Admit: 2015-08-30 | Discharge: 2015-08-30 | Disposition: A | Discharge status: Home / Self Care | Payer: Medicaid Other | Source: Ambulatory Visit | Attending: Radiation Oncology | Admitting: Radiation Oncology

## 2015-08-30 ENCOUNTER — Ambulatory Visit
Admission: RE | Admit: 2015-08-30 | Discharge: 2015-08-30 | Disposition: A | Discharge status: Home / Self Care | Payer: No Typology Code available for payment source | Source: Ambulatory Visit | Attending: Radiation Oncology | Admitting: Radiation Oncology

## 2015-08-30 VITALS — BP 114/77 | HR 79 | Temp 98.3°F | Resp 16 | Ht 67.0 in | Wt 139.0 lb

## 2015-08-30 DIAGNOSIS — C321 Malignant neoplasm of supraglottis: Secondary | ICD-10-CM

## 2015-08-30 DIAGNOSIS — Z510 Encounter for antineoplastic radiation therapy: Secondary | ICD-10-CM | POA: Diagnosis not present

## 2015-08-30 NOTE — Progress Notes (Signed)
Weekly Management Note:  Outpatient    ICD-9-CM ICD-10-CM   1. 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  CBCT/MVCT images/Port film x-rays were reviewed.  The chart was checked. Tearful due to social stressors.    Physical Findings:  Wt Readings from Last 3 Encounters:  08/30/15 139 lb (63.05 kg)  08/25/15 138 lb 4.8 oz (62.732 kg)  08/13/15 143 lb 3.2 oz (64.955 kg)    height is 5\' 7"  (1.702 m) and weight is 139 lb (63.05 kg). Her oral temperature is 98.3 F (36.8 C). Her blood pressure is 114/77 and her pulse is 79. Her respiration is 16 and oxygen saturation is 100%. tearful. Ambulatory. Skin intact over neck  CBC    Component Value Date/Time   WBC 8.1 07/15/2015 0113   WBC 7.6 07/14/2015 1021   RBC 3.92 07/15/2015 0113   RBC 4.21 07/14/2015 1021   HGB 12.2 07/15/2015 0156   HGB 13.2 07/14/2015 1021   HCT 36.0 07/15/2015 0156   HCT 38.2 07/14/2015 1021   PLT 236 07/15/2015 0113   PLT 253 07/14/2015 1021   MCV 90.3 07/15/2015 0113   MCV 91 07/14/2015 1021   MCH 31.1 07/15/2015 0113   MCH 31.4 07/14/2015 1021   MCHC 34.5 07/15/2015 0113   MCHC 34.6 07/14/2015 1021   RDW 13.7 07/15/2015 0113   RDW 13.5 07/14/2015 1021   LYMPHSABS 4.2* 07/15/2015 0113   LYMPHSABS 2.6 07/14/2015 1021   MONOABS 0.9 07/15/2015 0113   EOSABS 0.2 07/15/2015 0113   EOSABS 0.2 07/14/2015 1021   BASOSABS 0.0 07/15/2015 0113   BASOSABS 0.0 07/14/2015 1021     CMP     Component Value Date/Time   NA 138 07/15/2015 0156   NA 140 07/14/2015 1022   NA 140 03/08/2015 0832   K 3.9 07/15/2015 0156   K 3.9 07/14/2015 1022   K 3.9 03/08/2015 0832   CL 100* 07/15/2015 0156   CL 102 03/08/2015 0832   CO2 23 07/15/2015 0113   CO2 26 07/14/2015 1022   CO2 30 03/08/2015 0832   GLUCOSE 98 07/15/2015 0156   GLUCOSE 100 07/14/2015 1022   GLUCOSE 102 03/08/2015 0832    BUN 12 07/15/2015 0156   BUN 9.6 07/14/2015 1022   BUN 7 03/08/2015 0832   CREATININE 0.80 07/15/2015 0156   CREATININE 0.8 07/14/2015 1022   CREATININE 0.6 03/08/2015 0832   CALCIUM 9.2 07/15/2015 0113   CALCIUM 10.0 07/14/2015 1022   CALCIUM 9.6 03/08/2015 0832   PROT 7.6 07/14/2015 1022   PROT 7.3 03/08/2015 0832   PROT 5.8* 02/04/2015 1116   ALBUMIN 4.2 07/14/2015 1022   ALBUMIN 3.3* 02/04/2015 1116   AST 25 07/14/2015 1022   AST 28 03/08/2015 0832   AST 15 02/04/2015 1116   ALT 23 07/14/2015 1022   ALT 19 03/08/2015 0832   ALT 11 02/04/2015 1116   ALKPHOS 93 07/14/2015 1022   ALKPHOS 95* 03/08/2015 0832   ALKPHOS 126* 02/04/2015 1116   BILITOT 0.39 07/14/2015 1022   BILITOT 0.50 03/08/2015 0832   BILITOT 0.2 02/04/2015 1116   GFRNONAA >60 07/15/2015 0113   GFRAA >60 07/15/2015 0113     Impression:  The patient is tolerating radiotherapy.   Plan:  Continue radiotherapy as planned.  Handicap placard forms signed today for temporary placard. Tearful and enthusiastic  to see Polo Riley: Refer back to Social Work for emotional/social support.  -----------------------------------  Eppie Gibson, MD

## 2015-08-30 NOTE — Progress Notes (Signed)
[name] has completed 4 fractions to her supraglottis and bilateral neck.  She denies pain but has chronic lower back, knee and leg pain.  She takes Vicodin for this.  She reports having hot and cold flashes yesterday.  She said these started after chemo and have been worse since starting radiation.  She reports an occasional sore throat.  She denies trouble swallowing.  She reports she is eating softer foods like ice cream and milkshakes.  The skin on her neck is red.  She has not started using biafine yet.    BP 114/77 mmHg  Pulse 79  Temp(Src) 98.3 F (36.8 C) (Oral)  Resp 16  Ht 5\' 7"  (1.702 m)  Wt 139 lb (63.05 kg)  BMI 21.77 kg/m2  SpO2 100%   Wt Readings from Last 3 Encounters:  08/30/15 139 lb (63.05 kg)  08/25/15 138 lb 4.8 oz (62.732 kg)  08/13/15 143 lb 3.2 oz (64.955 kg)

## 2015-08-31 ENCOUNTER — Ambulatory Visit
Admission: RE | Admit: 2015-08-31 | Discharge: 2015-08-31 | Disposition: A | Discharge status: Home / Self Care | Payer: Medicaid Other | Source: Ambulatory Visit | Attending: Radiation Oncology | Admitting: Radiation Oncology

## 2015-08-31 DIAGNOSIS — Z510 Encounter for antineoplastic radiation therapy: Secondary | ICD-10-CM | POA: Diagnosis not present

## 2015-09-01 ENCOUNTER — Other Ambulatory Visit: Payer: Self-pay | Admitting: Radiation Oncology

## 2015-09-01 ENCOUNTER — Ambulatory Visit
Admission: RE | Admit: 2015-09-01 | Discharge: 2015-09-01 | Disposition: A | Discharge status: Home / Self Care | Payer: Medicaid Other | Source: Ambulatory Visit | Attending: Radiation Oncology | Admitting: Radiation Oncology

## 2015-09-01 DIAGNOSIS — C321 Malignant neoplasm of supraglottis: Secondary | ICD-10-CM

## 2015-09-01 DIAGNOSIS — Z510 Encounter for antineoplastic radiation therapy: Secondary | ICD-10-CM | POA: Diagnosis not present

## 2015-09-02 ENCOUNTER — Ambulatory Visit
Admission: RE | Admit: 2015-09-02 | Discharge: 2015-09-02 | Disposition: A | Discharge status: Home / Self Care | Payer: Medicaid Other | Source: Ambulatory Visit | Attending: Radiation Oncology | Admitting: Radiation Oncology

## 2015-09-02 DIAGNOSIS — Z510 Encounter for antineoplastic radiation therapy: Secondary | ICD-10-CM | POA: Diagnosis not present

## 2015-09-03 ENCOUNTER — Encounter: Payer: Self-pay | Admitting: *Deleted

## 2015-09-03 ENCOUNTER — Ambulatory Visit
Admission: RE | Admit: 2015-09-03 | Discharge: 2015-09-03 | Disposition: A | Discharge status: Home / Self Care | Payer: Medicaid Other | Source: Ambulatory Visit | Attending: Radiation Oncology | Admitting: Radiation Oncology

## 2015-09-03 ENCOUNTER — Encounter: Payer: Self-pay | Admitting: Gastroenterology

## 2015-09-03 DIAGNOSIS — Z510 Encounter for antineoplastic radiation therapy: Secondary | ICD-10-CM | POA: Diagnosis not present

## 2015-09-03 NOTE — Progress Notes (Signed)
  Oncology Nurse Navigator Documentation   Navigator Encounter Type: Treatment (09/03/15 1020) Patient Visit Type: JUVQQU (09/03/15 1020)       Oncology Nurse Navigator Documentation    Navigator Encounter Type: Treatment (09/03/15 1020) Patient Visit Type: IVHOYW (09/03/15 1020)     To provide support and encouragement, care continuity and to assess for needs, met with patient after her Tomo tmt. She reported: Tolerating tmts without difficulty. Beginning to experience dry mouth.  I encouraged her to drink lots of water, try Biotene products for added relief. She denied needs/concerns, understands I can be contacted.  Gayleen Orem, RN, BSN, [place] at [place] [phone]                     Time Spent with Patient: 15 (09/03/15 1020)                      Time Spent with Patient: 15 (09/03/15 1020)

## 2015-09-06 ENCOUNTER — Ambulatory Visit
Admission: RE | Admit: 2015-09-06 | Discharge: 2015-09-06 | Disposition: A | Discharge status: Home / Self Care | Payer: Medicaid Other | Source: Ambulatory Visit | Attending: Radiation Oncology | Admitting: Radiation Oncology

## 2015-09-06 ENCOUNTER — Encounter: Payer: Self-pay | Admitting: Radiation Oncology

## 2015-09-06 ENCOUNTER — Telehealth: Payer: Self-pay | Admitting: *Deleted

## 2015-09-06 ENCOUNTER — Ambulatory Visit
Admission: RE | Admit: 2015-09-06 | Discharge: 2015-09-06 | Disposition: A | Discharge status: Home / Self Care | Payer: No Typology Code available for payment source | Source: Ambulatory Visit | Attending: Radiation Oncology | Admitting: Radiation Oncology

## 2015-09-06 VITALS — BP 107/72 | HR 71 | Temp 97.7°F | Ht 67.0 in | Wt 141.0 lb

## 2015-09-06 DIAGNOSIS — C321 Malignant neoplasm of supraglottis: Secondary | ICD-10-CM

## 2015-09-06 DIAGNOSIS — Z510 Encounter for antineoplastic radiation therapy: Secondary | ICD-10-CM | POA: Diagnosis not present

## 2015-09-06 MED ORDER — SUCRALFATE 1 G PO TABS: ORAL_TABLET | ORAL | Status: DC

## 2015-09-06 MED ORDER — LIDOCAINE VISCOUS 2 % MT SOLN: OROMUCOSAL | Status: DC

## 2015-09-06 MED ORDER — MORPHINE SULFATE ER 30 MG PO TBCR: 30.0000 mg | EXTENDED_RELEASE_TABLET | Freq: Two times a day (BID) | ORAL | Status: DC

## 2015-09-06 MED ORDER — OXYCODONE HCL 10 MG PO TABS: 10.0000 mg | ORAL_TABLET | ORAL | Status: DC | PRN

## 2015-09-06 NOTE — Telephone Encounter (Signed)
CALLED PATIENT TO INFORM OF LAB FOR 09-07-15, LVM FOR A RETURN CALL

## 2015-09-06 NOTE — Progress Notes (Signed)
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  CBCT/MVCT images/Port film x-rays were reviewed.  The chart was checked. Soreness w/ swallowing; chills, frequent urination  Physical Findings:  Wt Readings from Last 3 Encounters:  09/06/15 141 lb (63.957 kg)  08/30/15 139 lb (63.05 kg)  08/25/15 138 lb 4.8 oz (62.732 kg)    height is 5\' 7"  (1.702 m) and weight is 141 lb (63.957 kg). Her oral temperature is 97.7 F (36.5 C). Her blood pressure is 107/72 and her pulse is 71. Her oxygen saturation is 100%. tearful. Ambulatory. Skin intact over neck, oropharynx with small sores on tongue c/w mucositis  CBC    Component Value Date/Time   WBC 8.1 07/15/2015 0113   WBC 7.6 07/14/2015 1021   RBC 3.92 07/15/2015 0113   RBC 4.21 07/14/2015 1021   HGB 12.2 07/15/2015 0156   HGB 13.2 07/14/2015 1021   HCT 36.0 07/15/2015 0156   HCT 38.2 07/14/2015 1021   PLT 236 07/15/2015 0113   PLT 253 07/14/2015 1021   MCV 90.3 07/15/2015 0113   MCV 91 07/14/2015 1021   MCH 31.1 07/15/2015 0113   MCH 31.4 07/14/2015 1021   MCHC 34.5 07/15/2015 0113   MCHC 34.6 07/14/2015 1021   RDW 13.7 07/15/2015 0113   RDW 13.5 07/14/2015 1021   LYMPHSABS 4.2* 07/15/2015 0113   LYMPHSABS 2.6 07/14/2015 1021   MONOABS 0.9 07/15/2015 0113   EOSABS 0.2 07/15/2015 0113   EOSABS 0.2 07/14/2015 1021   BASOSABS 0.0 07/15/2015 0113   BASOSABS 0.0 07/14/2015 1021     CMP     Component Value Date/Time   NA 138 07/15/2015 0156   NA 140 07/14/2015 1022   NA 140 03/08/2015 0832   K 3.9 07/15/2015 0156   K 3.9 07/14/2015 1022   K 3.9 03/08/2015  0832   CL 100* 07/15/2015 0156   CL 102 03/08/2015 0832   CO2 23 07/15/2015 0113   CO2 26 07/14/2015 1022   CO2 30 03/08/2015 0832   GLUCOSE 98 07/15/2015 0156   GLUCOSE 100 07/14/2015 1022   GLUCOSE 102 03/08/2015 0832   BUN 12 07/15/2015 0156   BUN 9.6 07/14/2015 1022   BUN 7 03/08/2015 0832   CREATININE 0.80 07/15/2015 0156   CREATININE 0.8 07/14/2015 1022   CREATININE 0.6 03/08/2015 0832   CALCIUM 9.2 07/15/2015 0113   CALCIUM 10.0 07/14/2015 1022   CALCIUM 9.6 03/08/2015 0832   PROT 7.6 07/14/2015 1022   PROT 7.3 03/08/2015 0832   PROT 5.8* 02/04/2015 1116   ALBUMIN 4.2 07/14/2015 1022   ALBUMIN 3.3* 02/04/2015 1116   AST 25 07/14/2015 1022   AST 28 03/08/2015 0832   AST 15 02/04/2015 1116   ALT 23 07/14/2015 1022   ALT 19 03/08/2015 0832   ALT 11 02/04/2015 1116   ALKPHOS 93 07/14/2015 1022   ALKPHOS 95* 03/08/2015 0832   ALKPHOS 126* 02/04/2015 1116   BILITOT 0.39 07/14/2015 1022   BILITOT 0.50 03/08/2015 0832   BILITOT 0.2 02/04/2015 1116  GFRNONAA >60 07/15/2015 0113   GFRAA >60 07/15/2015 0113     Impression:  The patient is tolerating radiotherapy.   Plan:  Continue radiotherapy as planned. I talked with Humphreys, NP from pt's pain clinic in [place].  Pt instructed to Stop methadone and oxycodone/acetaminophen from Pain Clinic. To manage acute effects, start Sucralfate, Lidocaine, MS Contin, and Oxycodone 10mg  as prescribed today. UA tomorrow - pt can't stay today - reports chills, frequent urination -----------------------------------  Eppie Gibson, MD

## 2015-09-06 NOTE — Patient Instructions (Signed)
Stop methadone and oxycodone/acetaminophen from Pain Clinic.  Start Sucralfate, Lidocaine, MS Contin, and Oxycodone 10mg  as prescribed.

## 2015-09-06 NOTE — Progress Notes (Signed)
Managing Acute Radiation Side Effects for Head and Neck Cancer  Skin irritation:  . Biafine  Topical Emulsion: First-line topical cream to help soothe skin irritation.  Apply to skin in radiation fields at least 4 hours before radiotherapy, or any time after treatments during the rest of the day.  . Triple Antibiotic Ointment (Neosporin): Apply to areas of skin with moist breakdown to prevent infection.  . 1% hydrocortisone cream: Apply to areas of skin that are itching, up to three times a day.  . Silvadene (Silver Sulfadiazine): Used in select cases if large patches of skin develop moist breakdown (let physician or nurse know if you have a "sulfa" drug allergy)  Soreness in mouth or throat: . Baking Soda Rinse: a home remedy to soothe/cleanse mouth and loosen thick saliva.  Mix 1/2 teaspoon salt, 1/2 teaspoon baking soda, 1 pint water.  Swish, gargle and spit as needed to soothe/cleanse mouth. Use as often as you want.  . Sucralfate: coats throat to soothe it before meals or any time of day. Crush 1 tablet in 10 mL H20 and swallow up to four times a day.  . 2% viscous Lidocaine: Soothes mouth and/or throat by numbing your mucous membranes. Mix 1 part 2% viscous lidocaine, 1 part H20. Swish and/or swallow 10mL of this mixture, 30min before meals and at bedtime, up to four times a day. Alternate with Sucralfate.  . Narcotics: Various short acting and long acting narcotics can be prescribed.  Often, medical oncology will prescribe these if you are receiving chemotherapy concurrently. Narcotics may cause constipation. It may be helpful to take a stool softener (Docusate Sodium) or gentle laxative (ie Senna or Polyethylene Glycol) to prevent constipation.  Having food in your stomach before ingesting a narcotic may reduce risk of stomach upset.  Thick Saliva: . Baking Soda Rinse: a home remedy to soothe/cleanse mouth and loosen thick saliva.  Mix 1/2 teaspoon salt, 1/2 teaspoon baking soda, 1  pint water.  Swish, gargle, and spit as needed to soothe/cleanse mouth. Use as often as you want.  . Some patients find Diet Ginger Ale or Papaya Juice to be helpful.  . In extreme cases, your physician may consider prescribing a Scopolamine transdermal patch which dries up your saliva.    Poor taste, or lack of taste:   . There are no well-established medications to combat taste bud changes from radiotherapy.  It often takes weeks to months to regain taste function.  Eating bland foods and drinking nutritional shakes  may help you maintain your weight when food is not enjoyable.  Some patients supplement their oral intake with a feeding tube.  Fatigue and weakness: . There is not a well-established safe and effective medication to combat radiation-induced fatigue.  However, if you are able to perform light exercise (such as a daily walk, yoga, recumbent stationary bicycling), this may combat fatigue and help you maintain muscle mass during treatment.  . Maintaining hydration and nutrition are also important.  If you have not been referred to a nutritionist and would like a referral, please let your nurse or physician know.  . Try to get at least 8 hours of sleep each night. You may need a daily nap, but try not to nap so late that it interferes with your nightly sleep schedule. 

## 2015-09-06 NOTE — Progress Notes (Signed)
[name] has completed 9 fractions to her supra glottis.  She reports pain at a 4-5/10 in her throat with swallowing.  She also reports having mouth sores on both sides of her mouth.  She is using lidocaine about "10 times a day" and hydrocodone/acetaminophen 7.5/325 4 times a day as well as methadone 4 times a day.  She reports that she is eating ice cream, pasta and bread.  She has trouble swallowing anything else. Orthostatic vitals done: bp sitting 120/79, hr 69, bp standing 107/72, hr 71.  She reports having nausea and takes Zofran as needed.  The skin on her neck is pink and she is using biafine.  BP 107/72 mmHg  Pulse 71  Temp(Src) 97.7 F (36.5 C) (Oral)  Ht 5\' 7"  (1.702 m)  Wt 141 lb (63.957 kg)  BMI 22.08 kg/m2  SpO2 100%   Wt Readings from Last 3 Encounters:  09/06/15 141 lb (63.957 kg)  08/30/15 139 lb (63.05 kg)  08/25/15 138 lb 4.8 oz (62.732 kg)

## 2015-09-07 ENCOUNTER — Ambulatory Visit
Admission: RE | Admit: 2015-09-07 | Discharge: 2015-09-07 | Disposition: A | Discharge status: Home / Self Care | Payer: Medicaid Other | Source: Ambulatory Visit | Attending: Radiation Oncology | Admitting: Radiation Oncology

## 2015-09-07 ENCOUNTER — Ambulatory Visit
Admission: RE | Admit: 2015-09-07 | Discharge: 2015-09-07 | Disposition: A | Discharge status: Home / Self Care | Payer: No Typology Code available for payment source | Source: Ambulatory Visit | Attending: Radiation Oncology | Admitting: Radiation Oncology

## 2015-09-07 ENCOUNTER — Telehealth: Payer: Self-pay

## 2015-09-07 DIAGNOSIS — C321 Malignant neoplasm of supraglottis: Secondary | ICD-10-CM | POA: Diagnosis present

## 2015-09-07 DIAGNOSIS — Z510 Encounter for antineoplastic radiation therapy: Secondary | ICD-10-CM | POA: Diagnosis not present

## 2015-09-07 LAB — URINALYSIS, MICROSCOPIC - CHCC
Bilirubin (Urine): NEGATIVE
Glucose: NEGATIVE mg/dL
KETONES: NEGATIVE mg/dL
LEUKOCYTE ESTERASE: NEGATIVE
NITRITE: NEGATIVE
PROTEIN: NEGATIVE mg/dL
Specific Gravity, Urine: 1.01 (ref 1.003–1.035)
UROBILINOGEN UR: 0.2 mg/dL (ref 0.2–1)
pH: 6 (ref 4.6–8.0)

## 2015-09-07 NOTE — Progress Notes (Signed)
Called to the back by RT therapist Jeannetta Nap, stating she had to escort patient to the table, wobbly , took orthovitals, wnl, patient stated its her right knee pain 4/10 scale,took her morphione this am around 930-10am, says she is fine now, escorted patient to elevator, wasn't wobly seen or noted, patient is driving said I'm fine now,its just the knee' 11:05 AM

## 2015-09-07 NOTE — Telephone Encounter (Signed)
Called patient to inform of normal results of urinalysis.No answer.will try again this evening.

## 2015-09-08 ENCOUNTER — Telehealth: Payer: Self-pay

## 2015-09-08 ENCOUNTER — Ambulatory Visit
Admission: RE | Admit: 2015-09-08 | Discharge: 2015-09-08 | Disposition: A | Discharge status: Home / Self Care | Payer: Medicaid Other | Source: Ambulatory Visit | Attending: Radiation Oncology | Admitting: Radiation Oncology

## 2015-09-08 DIAGNOSIS — Z510 Encounter for antineoplastic radiation therapy: Secondary | ICD-10-CM | POA: Diagnosis not present

## 2015-09-08 NOTE — Telephone Encounter (Signed)
Multiple attempts made to contact patient for intake appointment.    I have also contacted [place]  with [facility] to inform above. Hopefully she will call to schedule intake.   Laverle Patter, RN

## 2015-09-09 ENCOUNTER — Ambulatory Visit
Admission: RE | Admit: 2015-09-09 | Discharge: 2015-09-09 | Disposition: A | Discharge status: Home / Self Care | Payer: Medicaid Other | Source: Ambulatory Visit | Attending: Radiation Oncology | Admitting: Radiation Oncology

## 2015-09-09 DIAGNOSIS — Z510 Encounter for antineoplastic radiation therapy: Secondary | ICD-10-CM | POA: Diagnosis not present

## 2015-09-10 ENCOUNTER — Ambulatory Visit
Admission: RE | Admit: 2015-09-10 | Discharge: 2015-09-10 | Disposition: A | Discharge status: Home / Self Care | Payer: Medicaid Other | Source: Ambulatory Visit | Attending: Radiation Oncology | Admitting: Radiation Oncology

## 2015-09-10 DIAGNOSIS — Z510 Encounter for antineoplastic radiation therapy: Secondary | ICD-10-CM | POA: Diagnosis not present

## 2015-09-13 ENCOUNTER — Ambulatory Visit
Admission: RE | Admit: 2015-09-13 | Discharge: 2015-09-13 | Disposition: A | Discharge status: Home / Self Care | Payer: Medicaid Other | Source: Ambulatory Visit | Attending: Radiation Oncology | Admitting: Radiation Oncology

## 2015-09-13 ENCOUNTER — Ambulatory Visit
Admission: RE | Admit: 2015-09-13 | Discharge: 2015-09-13 | Disposition: A | Discharge status: Home / Self Care | Payer: No Typology Code available for payment source | Source: Ambulatory Visit | Attending: Radiation Oncology | Admitting: Radiation Oncology

## 2015-09-13 ENCOUNTER — Encounter: Payer: Self-pay | Admitting: Radiation Oncology

## 2015-09-13 VITALS — BP 103/72 | HR 71 | Temp 98.0°F | Ht 67.0 in | Wt 134.9 lb

## 2015-09-13 DIAGNOSIS — Z510 Encounter for antineoplastic radiation therapy: Secondary | ICD-10-CM | POA: Diagnosis not present

## 2015-09-13 DIAGNOSIS — C321 Malignant neoplasm of supraglottis: Secondary | ICD-10-CM

## 2015-09-13 NOTE — Progress Notes (Signed)
Weekly Management Note:  Outpatient    ICD-9-CM ICD-10-CM   1. 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  CBCT/MVCT images/Port film x-rays were reviewed.  The chart was checked.  Pain well managed by MS Contin and Oxycodone.  Using Nicotine Patch.   Physical Findings:  Wt Readings from Last 3 Encounters:  09/13/15 134 lb 14.4 oz (61.19 kg)  09/06/15 141 lb (63.957 kg)  08/30/15 139 lb (63.05 kg)    height is 5\' 7"  (1.702 m) and weight is 134 lb 14.4 oz (61.19 kg). Her temperature is 98 F (36.7 C). Her blood pressure is 103/72 and her pulse is 71.   Ambulatory. Skin intact over neck, patchy mucositis over palate  CBC    Component Value Date/Time   WBC 8.1 07/15/2015 0113   WBC 7.6 07/14/2015 1021   RBC 3.92 07/15/2015 0113   RBC 4.21 07/14/2015 1021   HGB 12.2 07/15/2015 0156   HGB 13.2 07/14/2015 1021   HCT 36.0 07/15/2015 0156   HCT 38.2 07/14/2015 1021   PLT 236 07/15/2015 0113   PLT 253 07/14/2015 1021   MCV 90.3 07/15/2015 0113   MCV 91 07/14/2015 1021   MCH 31.1 07/15/2015 0113   MCH 31.4 07/14/2015 1021   MCHC 34.5 07/15/2015 0113   MCHC 34.6 07/14/2015 1021   RDW 13.7 07/15/2015 0113   RDW 13.5 07/14/2015 1021   LYMPHSABS 4.2* 07/15/2015 0113   LYMPHSABS 2.6 07/14/2015 1021   MONOABS 0.9 07/15/2015 0113   EOSABS 0.2 07/15/2015 0113   EOSABS 0.2 07/14/2015 1021   BASOSABS 0.0 07/15/2015 0113   BASOSABS 0.0 07/14/2015 1021     CMP     Component Value Date/Time   NA 138 07/15/2015 0156   NA 140 07/14/2015 1022   NA 140 03/08/2015 0832   K 3.9 07/15/2015 0156   K 3.9 07/14/2015 1022   K 3.9 03/08/2015 0832   CL 100* 07/15/2015 0156   CL 102 03/08/2015 0832   CO2 23 07/15/2015 0113   CO2 26 07/14/2015 1022   CO2 30 03/08/2015 0832   GLUCOSE 98 07/15/2015 0156   GLUCOSE 100 07/14/2015 1022   GLUCOSE 102 03/08/2015  0832   BUN 12 07/15/2015 0156   BUN 9.6 07/14/2015 1022   BUN 7 03/08/2015 0832   CREATININE 0.80 07/15/2015 0156   CREATININE 0.8 07/14/2015 1022   CREATININE 0.6 03/08/2015 0832   CALCIUM 9.2 07/15/2015 0113   CALCIUM 10.0 07/14/2015 1022   CALCIUM 9.6 03/08/2015 0832   PROT 7.6 07/14/2015 1022   PROT 7.3 03/08/2015 0832   PROT 5.8* 02/04/2015 1116   ALBUMIN 4.2 07/14/2015 1022   ALBUMIN 3.3* 02/04/2015 1116   AST 25 07/14/2015 1022   AST 28 03/08/2015 0832   AST 15 02/04/2015 1116   ALT 23 07/14/2015 1022   ALT 19 03/08/2015 0832   ALT 11 02/04/2015 1116   ALKPHOS 93 07/14/2015 1022   ALKPHOS 95* 03/08/2015 0832   ALKPHOS 126* 02/04/2015 1116   BILITOT 0.39 07/14/2015 1022   BILITOT 0.50 03/08/2015 0832   BILITOT 0.2 02/04/2015 1116   GFRNONAA >60 07/15/2015 0113   GFRAA >60 07/15/2015 0113     Impression:  The patient is tolerating radiotherapy.   Plan:  Continue radiotherapy as planned.   Pain control:On 9-19 I talked with [place], NP  from pt's pain clinic in [place].  Pt instructed to Stop methadone and oxycodone/acetaminophen from Pain Clinic. To manage acute effects, start Sucralfate, Lidocaine, MS Contin, and Oxycodone 10mg  as prescribed by me on 9-19   Will notify [place] as pt is interested in support group(s) -----------------------------------  Eppie Gibson, MD

## 2015-09-13 NOTE — Progress Notes (Signed)
[name] is here for # 14 fraction for larynx cancer. She reports pain in throat which she takes oxycodone 10 mg every 4 hours, and MsContin. She reports fatigue, painful swallowing, and eating soft foods.  BP 103/72 mmHg  Pulse 71  Temp(Src) 98 F (36.7 C)  Ht 5\' 7"  (1.702 m)  Wt 134 lb 14.4 oz (61.19 kg)  BMI 21.12 kg/m2  Sitting BP 103/72 pulse 71 and standing BP 104/62 pulse 77

## 2015-09-14 ENCOUNTER — Ambulatory Visit
Admission: RE | Admit: 2015-09-14 | Discharge: 2015-09-14 | Disposition: A | Discharge status: Home / Self Care | Payer: Medicaid Other | Source: Ambulatory Visit | Attending: Radiation Oncology | Admitting: Radiation Oncology

## 2015-09-14 DIAGNOSIS — Z510 Encounter for antineoplastic radiation therapy: Secondary | ICD-10-CM | POA: Diagnosis not present

## 2015-09-15 ENCOUNTER — Ambulatory Visit
Admission: RE | Admit: 2015-09-15 | Discharge: 2015-09-15 | Disposition: A | Discharge status: Home / Self Care | Payer: Medicaid Other | Source: Ambulatory Visit | Attending: Radiation Oncology | Admitting: Radiation Oncology

## 2015-09-15 ENCOUNTER — Ambulatory Visit: Payer: Medicaid Other

## 2015-09-16 ENCOUNTER — Ambulatory Visit
Admission: RE | Admit: 2015-09-16 | Discharge: 2015-09-16 | Disposition: A | Discharge status: Home / Self Care | Payer: Medicaid Other | Source: Ambulatory Visit | Attending: Radiation Oncology | Admitting: Radiation Oncology

## 2015-09-16 ENCOUNTER — Telehealth: Payer: Self-pay | Admitting: *Deleted

## 2015-09-16 DIAGNOSIS — Z510 Encounter for antineoplastic radiation therapy: Secondary | ICD-10-CM | POA: Diagnosis not present

## 2015-09-16 NOTE — Telephone Encounter (Addendum)
  Oncology Nurse Navigator Documentation   Navigator Encounter Type: Telephone (09/16/15 1610)     Called patient to follow-up on her missed RT appt yesterday. She reported:  Didn't feel well, was running a slight fever.  Recently has been coughing up mucous with scant amts of blood, denied continuous bleeding, expressed concern.  I noted this is an expected SE r/t throat irritation. I discussed the importance of coming in for tmts to maintain tmt consistency and when she isn't feeling well so she can be evaluated.   She verbalized understanding of information provided. She stated she was coming in for tmt today.  Gayleen Orem, RN, BSN, [place] at [place] [phone]                   Time Spent with Patient: 15 (09/16/15 0923)

## 2015-09-17 ENCOUNTER — Ambulatory Visit
Admission: RE | Admit: 2015-09-17 | Discharge: 2015-09-17 | Disposition: A | Discharge status: Home / Self Care | Payer: Medicaid Other | Source: Ambulatory Visit | Attending: Radiation Oncology | Admitting: Radiation Oncology

## 2015-09-17 ENCOUNTER — Encounter: Payer: Self-pay | Admitting: *Deleted

## 2015-09-17 ENCOUNTER — Encounter: Payer: Self-pay | Admitting: Radiation Oncology

## 2015-09-17 ENCOUNTER — Ambulatory Visit
Admission: RE | Admit: 2015-09-17 | Discharge: 2015-09-17 | Disposition: A | Discharge status: Home / Self Care | Payer: No Typology Code available for payment source | Source: Ambulatory Visit | Attending: Radiation Oncology | Admitting: Radiation Oncology

## 2015-09-17 VITALS — BP 109/77 | HR 78 | Temp 98.2°F | Ht 67.0 in | Wt 133.9 lb

## 2015-09-17 DIAGNOSIS — C321 Malignant neoplasm of supraglottis: Secondary | ICD-10-CM

## 2015-09-17 DIAGNOSIS — Z510 Encounter for antineoplastic radiation therapy: Secondary | ICD-10-CM | POA: Diagnosis not present

## 2015-09-17 MED ORDER — ONDANSETRON HCL 8 MG PO TABS
8.0000 mg | ORAL_TABLET | Freq: Three times a day (TID) | ORAL | Status: DC | PRN
Start: 2015-09-17 — End: 2016-01-29

## 2015-09-17 NOTE — Progress Notes (Signed)
  Oncology Nurse Navigator Documentation   Navigator Encounter Type: Treatment (09/17/15 1030) Patient Visit Type: WHQPRF (09/17/15 1030)       To provide support and encouragement, care continuity and to assess for needs, met with patient during tomo tmt, afterwards in RadOnc to see MD.  She was accompanied by her dtr [place], arrived in [facility] for comfort. She completed tmt without difficulty. She reported:  Able to swallow, using Lidocaine rinse/Carafate before meals.  Persistent N&V when eats/drinks Ensure since Wednesday.  She has 88-month old Zofran Rx from when she was taking chemo but has not been taking it.  Thickened saliva which causes nausea.  Applying Biafine once daily "if I remember".  I encouraged her to apply BID-TID. She chose not to wait to see RD Dory Peru for f/u. She verbalized understanding of Dr. Ida Rogue guidance for new Zofran Rx.  Gayleen Orem, RN, BSN, [place] at [place] [phone]                     Time Spent with Patient: 60 (09/17/15 1030)

## 2015-09-17 NOTE — Progress Notes (Signed)
[name] is here for her 17th fraction of radiation to larynx. She was being treated and wanted to speak to a doctor about nausea/vomiting she has had since Monday. She has not been able to eat or drink much since then and if she does she vomits it afterward, she also has not been able to take some of her medicines. She does complain of headaches which she is taking oxycodone for. BP 109/77 mmHg  Pulse 78  Temp(Src) 98.2 F (36.8 C)  Ht 5\' 7"  (1.702 m)  Wt 133 lb 14.4 oz (60.737 kg)  BMI 20.97 kg/m2

## 2015-09-17 NOTE — Progress Notes (Signed)
Department of Radiation Oncology  Phone:  [phone] Fax:        [phone]  Weekly Treatment Note    Name: [name] Date: 09/17/2015 MRN: [id number] DOB: 04/05/1966   Current dose: 34 Gy  Current fraction:17   MEDICATIONS: Current Outpatient Prescriptions  Medication Sig Dispense Refill  . ascorbic acid (VITAMIN C) 500 MG tablet Take 500 mg by mouth daily.    Marland Kitchen b complex vitamins tablet Take 1 tablet by mouth daily.    . diazepam (VALIUM) 5 MG tablet TAKE 1 TABLET BY MOUTH EVERY 8 HOURS AS NEEDED FOR ANXIETY 90 tablet 0  . diclofenac sodium (VOLTAREN) 1 % GEL Apply 2 g topically as needed.     . DULoxetine (CYMBALTA) 30 MG capsule Take 1 cap daily for one week, then increase to two caps daily.    Marland Kitchen emollient (BIAFINE) cream Apply 90 g topically as needed.    . Emtricitab-Rilpivir-Tenofov DF (COMPLERA) 200-25-300 MG TABS TAKE 1 TABLET BY MOUTH EVERY DAY WITH FOOD    . ferrous fumarate (HEMOCYTE - 106 MG FE) 325 (106 FE) MG TABS tablet Take 1 tablet by mouth daily.    Marland Kitchen ibuprofen (ADVIL,MOTRIN) 600 MG tablet Take 600 mg by mouth as needed.    . lidocaine (XYLOCAINE) 2 % solution Patient: Mix 1part 2% viscous lidocaine, 1part H20. Swish and/or swallow 27mL of this mixture, 29min before meals and at bedtime, up to QID 100 mL 5  . morphine (MS CONTIN) 30 MG 12 hr tablet Take 1 tablet (30 mg total) by mouth every 12 (twelve) hours. 60 tablet 0  . Nutritional Supplements (ENSURE COMPLETE SHAKE) LIQD Take 1 Bottle by mouth 2 (two) times daily. 60 Bottle 12  . Oxycodone HCl 10 MG TABS Take 1 tablet (10 mg total) by mouth every 4 (four) hours as needed. 90 tablet 0  . pregabalin (LYRICA) 50 MG capsule Take 50 mg by mouth 3 (three) times daily.     . sucralfate (CARAFATE) 1 G tablet Dissolve 1 tablet in 10 mL H20 and swallow up to QID 90 tablet 5  . varenicline (CHANTIX) 0.5 MG tablet Take 1 tablet (0.5 mg total) by mouth 2 (two) times daily. Take one tablet daily for days  1-3. Then one tablet two times per day for days 4-7. Then start two tablets two times per day for remainder of therapy. 95 tablet 0   No current facility-administered medications for this encounter.     ALLERGIES: Ancef; Penicillin g; and Ciprofloxacin   LABORATORY DATA:  Lab Results  Component Value Date   WBC 8.1 07/15/2015   HGB 12.2 07/15/2015   HCT 36.0 07/15/2015   MCV 90.3 07/15/2015   PLT 236 07/15/2015   Lab Results  Component Value Date   NA 138 07/15/2015   K 3.9 07/15/2015   CL 100* 07/15/2015   CO2 23 07/15/2015   Lab Results  Component Value Date   ALT 23 07/14/2015   AST 25 07/14/2015   ALKPHOS 93 07/14/2015   BILITOT 0.39 07/14/2015     NARRATIVE: [name] was seen today for weekly treatment management. The chart was checked and the patient's films were reviewed.  [name] is here for her 17th fraction of radiation to larynx. She was being treated and wanted to speak to a doctor about nausea/vomiting she has had since Monday. She has not been able to eat or drink much since then and if she does she vomits it  afterward, she also has not been able to take some of her medicines. She does complain of headaches which she is taking oxycodone for. BP 109/77 mmHg  Pulse 78  Temp(Src) 98.2 F (36.8 C)  Ht 5\' 7"  (1.702 m)  Wt 133 lb 14.4 oz (60.737 kg)  BMI 20.97 kg/m2  PHYSICAL EXAMINATION: height is 5\' 7"  (1.702 m) and weight is 133 lb 14.4 oz (60.737 kg). Her temperature is 98.2 F (36.8 C). Her blood pressure is 109/77 and her pulse is 78.        ASSESSMENT: The patient is doing satisfactorily with treatment.  PLAN: We will continue with the patient's radiation treatment as planned. The patient has been called in a prescription for Zofran. This has worked well for her in the past.

## 2015-09-20 ENCOUNTER — Ambulatory Visit
Admission: RE | Admit: 2015-09-20 | Discharge: 2015-09-20 | Disposition: A | Discharge status: Home / Self Care | Payer: No Typology Code available for payment source | Source: Ambulatory Visit | Attending: Radiation Oncology | Admitting: Radiation Oncology

## 2015-09-20 ENCOUNTER — Telehealth: Payer: Self-pay | Admitting: *Deleted

## 2015-09-20 ENCOUNTER — Other Ambulatory Visit (HOSPITAL_COMMUNITY): Payer: Self-pay | Admitting: Radiation Oncology

## 2015-09-20 ENCOUNTER — Encounter: Payer: Self-pay | Admitting: *Deleted

## 2015-09-20 ENCOUNTER — Ambulatory Visit: Payer: No Typology Code available for payment source | Admitting: Radiation Oncology

## 2015-09-20 ENCOUNTER — Encounter: Payer: Self-pay | Admitting: Radiation Oncology

## 2015-09-20 ENCOUNTER — Ambulatory Visit
Admission: RE | Admit: 2015-09-20 | Discharge: 2015-09-20 | Disposition: A | Discharge status: Home / Self Care | Payer: Medicaid Other | Source: Ambulatory Visit | Attending: Radiation Oncology | Admitting: Radiation Oncology

## 2015-09-20 VITALS — BP 115/62 | HR 85 | Temp 98.1°F | Ht 67.0 in | Wt 134.9 lb

## 2015-09-20 DIAGNOSIS — C321 Malignant neoplasm of supraglottis: Secondary | ICD-10-CM

## 2015-09-20 DIAGNOSIS — Z510 Encounter for antineoplastic radiation therapy: Secondary | ICD-10-CM | POA: Diagnosis not present

## 2015-09-20 DIAGNOSIS — R1310 Dysphagia, unspecified: Secondary | ICD-10-CM

## 2015-09-20 MED ORDER — MORPHINE SULFATE ER 30 MG PO TBCR
30.0000 mg | EXTENDED_RELEASE_TABLET | Freq: Two times a day (BID) | ORAL | Status: DC
Start: 2015-09-20 — End: 2015-10-25

## 2015-09-20 MED ORDER — OXYCODONE HCL 10 MG PO TABS: 10.0000 mg | ORAL_TABLET | ORAL | Status: DC | PRN

## 2015-09-20 MED ORDER — FLUCONAZOLE 100 MG PO TABS: ORAL_TABLET | ORAL | Status: DC

## 2015-09-20 NOTE — Progress Notes (Signed)
Weekly Management Note:  Outpatient    ICD-9-CM ICD-10-CM   1. 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  CBCT/MVCT images/Port film x-rays were reviewed.  The chart was checked.   She complains of a headache that wraps around her head, and ends on her left side that comes and goes. She reports it as a level 10 at times. She also complains of a sore throat that is painful. She is eating mostly soft foods like mashed potatoes, applesauce, and soft cereals. She reports her mucous is thick but clear in color. She was feeling worse last week, but now reports fatigue, and is not able to participate in her normal activies. Her skin is red, dry and she states she is using the biafine twice a day. She reports dysphagia.  Physical Findings:  Wt Readings from Last 3 Encounters:  09/20/15 134 lb 14.4 oz (61.19 kg)  09/17/15 133 lb 14.4 oz (60.737 kg)  09/13/15 134 lb 14.4 oz (61.19 kg)    height is 5\' 7"  (1.702 m) and weight is 134 lb 14.4 oz (61.19 kg). Her temperature is 98.1 F (36.7 C). Her blood pressure is 115/62 and her pulse is 85.   Ambulatory. Skin intact over neck, patchy mucositis over palate, diffuse oral thrush.  CBC    Component Value Date/Time   WBC 8.1 07/15/2015 0113   WBC 7.6 07/14/2015 1021   RBC 3.92 07/15/2015 0113   RBC 4.21 07/14/2015 1021   HGB 12.2 07/15/2015 0156   HGB 13.2 07/14/2015 1021   HCT 36.0 07/15/2015 0156   HCT 38.2 07/14/2015 1021   PLT 236 07/15/2015 0113   PLT 253 07/14/2015 1021   MCV 90.3 07/15/2015 0113   MCV 91 07/14/2015 1021   MCH 31.1 07/15/2015 0113   MCH 31.4 07/14/2015 1021   MCHC 34.5 07/15/2015 0113   MCHC 34.6 07/14/2015 1021   RDW 13.7 07/15/2015 0113   RDW 13.5 07/14/2015 1021   LYMPHSABS 4.2* 07/15/2015 0113   LYMPHSABS 2.6 07/14/2015 1021   MONOABS 0.9 07/15/2015 0113   EOSABS 0.2 07/15/2015  0113   EOSABS 0.2 07/14/2015 1021   BASOSABS 0.0 07/15/2015 0113   BASOSABS 0.0 07/14/2015 1021     CMP     Component Value Date/Time   NA 138 07/15/2015 0156   NA 140 07/14/2015 1022   NA 140 03/08/2015 0832   K 3.9 07/15/2015 0156   K 3.9 07/14/2015 1022   K 3.9 03/08/2015 0832   CL 100* 07/15/2015 0156   CL 102 03/08/2015 0832   CO2 23 07/15/2015 0113   CO2 26 07/14/2015 1022   CO2 30 03/08/2015 0832   GLUCOSE 98 07/15/2015 0156   GLUCOSE 100 07/14/2015 1022   GLUCOSE 102 03/08/2015 0832   BUN 12 07/15/2015 0156   BUN 9.6 07/14/2015 1022   BUN 7 03/08/2015 0832   CREATININE 0.80 07/15/2015 0156   CREATININE 0.8 07/14/2015 1022   CREATININE 0.6 03/08/2015 0832   CALCIUM 9.2 07/15/2015 0113   CALCIUM 10.0 07/14/2015 1022   CALCIUM 9.6 03/08/2015 0832   PROT 7.6 07/14/2015 1022   PROT 7.3 03/08/2015 0832   PROT 5.8* 02/04/2015 1116   ALBUMIN 4.2 07/14/2015 1022   ALBUMIN 3.3* 02/04/2015 1116   AST 25 07/14/2015 1022   AST 28 03/08/2015 0832   AST 15  02/04/2015 1116   ALT 23 07/14/2015 1022   ALT 19 03/08/2015 0832   ALT 11 02/04/2015 1116   ALKPHOS 93 07/14/2015 1022   ALKPHOS 95* 03/08/2015 0832   ALKPHOS 126* 02/04/2015 1116   BILITOT 0.39 07/14/2015 1022   BILITOT 0.50 03/08/2015 0832   BILITOT 0.2 02/04/2015 1116   GFRNONAA >60 07/15/2015 0113   GFRAA >60 07/15/2015 0113     Impression:  The patient is tolerating radiotherapy.   Plan:  Continue radiotherapy as planned.   Refer back to SLP for dysphagia  Fluconazole for thrush  Increase oxycodone 10mg  freq from q4hrs to q3hrs  Pain control:On 9-19 I talked with [place], NP from pt's pain clinic in [place].  Pt instructed to Stop methadone and oxycodone/acetaminophen from Pain Clinic. To manage acute effects, start Sucralfate, Lidocaine, MS Contin, and Oxycodone 10mg  as prescribed by me on 9-19   Lauren Mullis has reached out to her as pt is interested in support  group(s) -----------------------------------  Eppie Gibson, MD

## 2015-09-20 NOTE — Telephone Encounter (Signed)
Called patient to inform of modified barium swallow on 09-30-15- arrival time - 12:45 pm @ [facility] and her appt. With Garald Balding on 10-04-15 - arrival time - 1 pm, spoke with patient and she is aware of these appts.

## 2015-09-20 NOTE — Progress Notes (Signed)
[name] presents for her 18th fraction for laryngeal cancer. She complains of a headache that wraps around her head, and ends on her left side that comes and goes. She reports it as a level 10 at times. She also complains of a sore throat that is painful. She is eating mostly soft foods like mashed potatoes, applesauce, and soft cereals. She reports her mucous is thick but clear in color. She was feeling worse last week, but now reports fatigue, and is not able to participate in her normal activies. Her skin is red, dry and she states she is using the biafine twice a day.  BP 115/62 mmHg  Pulse 85  Temp(Src) 98.1 F (36.7 C)  Ht 5\' 7"  (1.702 m)  Wt 134 lb 14.4 oz (61.19 kg)  BMI 21.12 kg/m2  Wt Readings from Last 3 Encounters:  09/20/15 134 lb 14.4 oz (61.19 kg)  09/17/15 133 lb 14.4 oz (60.737 kg)  09/13/15 134 lb 14.4 oz (61.19 kg)

## 2015-09-21 ENCOUNTER — Telehealth: Payer: Self-pay | Admitting: *Deleted

## 2015-09-21 ENCOUNTER — Ambulatory Visit: Payer: Medicaid Other

## 2015-09-21 ENCOUNTER — Ambulatory Visit
Admission: RE | Admit: 2015-09-21 | Discharge: 2015-09-21 | Disposition: A | Discharge status: Home / Self Care | Payer: Medicaid Other | Source: Ambulatory Visit | Attending: Radiation Oncology | Admitting: Radiation Oncology

## 2015-09-21 NOTE — Telephone Encounter (Signed)
  Oncology Nurse Navigator Documentation   Navigator Encounter Type: Telephone (09/21/15 1127)         Interventions: Coordination of Care (09/21/15 1127)     Called pt to check on wellbeing as she missed scheduled 1020 Tomo tmt.  LVMM with request for return call.   Gayleen Orem, RN, BSN, [place] at [place] [phone]           Time Spent with Patient: 15 (09/21/15 1127)

## 2015-09-21 NOTE — Progress Notes (Signed)
  Oncology Nurse Navigator Documentation   Navigator Encounter Type: Clinic/MDC (09/20/15 1020) Patient Visit Type: Radonc (09/20/15 1020)     To provide support and encouragement, care continuity and to assess for needs, met with patient after Tomo and during WUT with Dr. Isidore Moos. She reported:  Nausea controlled with Zofran Rx received last Friday.  Using Lidocaine rinse prior to meals.  Using salt water / baking soda rinse to manage thickened saliva. I encouraged her to apply Biafine minimally BID. She denied any needs or concerns; I encouraged her to contact me if that changes before I see her next, she verbalized understanding.  Gayleen Orem, RN, BSN, [place] at [place] [phone]                     Time Spent with Patient: 30 (09/20/15 1020)

## 2015-09-21 NOTE — Telephone Encounter (Signed)
  Oncology Nurse Navigator Documentation   Navigator Encounter Type: Telephone (09/21/15 1425)         Interventions: Coordination of Care (09/21/15 1425)     LVMM for patient with request to call me re her wellbeing.  Gayleen Orem, RN, BSN, [place] at [place] [phone]           Time Spent with Patient: 15 (09/21/15 1425)

## 2015-09-21 NOTE — Telephone Encounter (Signed)
  Oncology Nurse Navigator Documentation   Navigator Encounter Type: Telephone (09/21/15 1146)         Interventions: Coordination of Care (09/21/15 1146)       LVMM for patient notifying of rescheduled RT for 1500 today.  Requested she call me to confirm receipt of message.  Gayleen Orem, RN, BSN, [place] at [place] [phone]         Time Spent with Patient: 15 (09/21/15 1146)

## 2015-09-22 ENCOUNTER — Ambulatory Visit
Admission: RE | Admit: 2015-09-22 | Discharge: 2015-09-22 | Disposition: A | Discharge status: Home / Self Care | Payer: Medicaid Other | Source: Ambulatory Visit | Attending: Radiation Oncology | Admitting: Radiation Oncology

## 2015-09-22 ENCOUNTER — Ambulatory Visit: Admission: RE | Admit: 2015-09-22 | Payer: Medicaid Other | Source: Ambulatory Visit

## 2015-09-22 ENCOUNTER — Telehealth: Payer: Self-pay | Admitting: *Deleted

## 2015-09-22 NOTE — Telephone Encounter (Signed)
  Oncology Nurse Navigator Documentation   Navigator Encounter Type: Telephone (09/22/15 1537)         Interventions: Coordination of Care (09/22/15 0857)     Informed by Louanna Raw RTT Miranda that patient called earlier today and cancelled today's scheduled RT.  Called her to check on wellbeing, LVMM asking her to return my call.  Gayleen Orem, RN, BSN, [place] at [place] [phone]           Time Spent with Patient: 15 (09/22/15 0857)

## 2015-09-23 ENCOUNTER — Ambulatory Visit
Admission: RE | Admit: 2015-09-23 | Discharge: 2015-09-23 | Disposition: A | Discharge status: Home / Self Care | Payer: Medicaid Other | Source: Ambulatory Visit | Attending: Radiation Oncology | Admitting: Radiation Oncology

## 2015-09-23 ENCOUNTER — Ambulatory Visit
Admission: RE | Admit: 2015-09-23 | Discharge: 2015-09-23 | Disposition: A | Discharge status: Home / Self Care | Payer: No Typology Code available for payment source | Source: Ambulatory Visit | Attending: Radiation Oncology | Admitting: Radiation Oncology

## 2015-09-23 ENCOUNTER — Encounter: Payer: Self-pay | Admitting: *Deleted

## 2015-09-23 DIAGNOSIS — C321 Malignant neoplasm of supraglottis: Secondary | ICD-10-CM

## 2015-09-23 DIAGNOSIS — Z510 Encounter for antineoplastic radiation therapy: Secondary | ICD-10-CM | POA: Diagnosis not present

## 2015-09-23 NOTE — Progress Notes (Signed)
CC: Dr. Isidore Moos  Weekly Management Note:  Site: Supraglottic larynx/bilateral neck Current Dose:  3800  cGy Projected Dose: 7000  cGy  Narrative: The patient is seen today for routine under treatment assessment. CBCT/MVCT images/port films were reviewed. The chart was reviewed.   She seen today for a 3 day history of worsening left ear pain which she describes as throbbing.  No significant hearing loss.  She has been taking oxycodone without much benefit.  She said that she felt "warm" yesterday evening but did not check her temperature.  She wonders if she actually had some chills as well.  Her otolaryngologist (Dr. Sherlene Shams) is at [place].  Physical Examination: There were no vitals filed for this visit..  Weight:  .  On inspection of left ear canal there is no erythema and the tympanic membrane appears to be normal as well.  No obvious evidence for serous otitis media.  On inspection of the oropharynx there is mild mucositis with some thickening of her secretions.  Impression: I met a loss to explain her left ear pain.  I do not think she has serous otitis media nor do I think she has an infection.  I suggested to her that she see Dr. Sherlene Shams for further evaluation.  Plan: Continue radiation therapy as planned.

## 2015-09-23 NOTE — Progress Notes (Signed)
  Oncology Nurse Navigator Documentation   Navigator Encounter Type: Treatment (09/23/15 1020) Patient Visit Type: Radonc (09/23/15 1020)     Met with patient after scheduled Tomo.  She was complaining of L ear pain and was going to see Dr. Valere Dross.  She reported she did keep appts the past two days b/c she had been running a fever, "not feeling well".  I explained the importance of her keeping [place] informed of her wellbeing so she can be seen my a MD when not feeling well, importance of tmt continuity to maximize benefit.  She verbalized understanding. I encouraged her to call me with needs/concerns.  Gayleen Orem, RN, BSN, [place] at [place] [phone]                    Time Spent with Patient: 15 (09/23/15 1020)

## 2015-09-23 NOTE — Progress Notes (Signed)
[name] presents today with severe Left ear pain. She reports it is a 10, a throbbing pain that starts at her ear lobe and continues down her neck on the inside. She states the earache started yesterday, but the pain below her ear started several days ago. Her skin is red, tender, and slightly swollen at the site of her pain. BP 116/82 mmHg  Pulse 79  Temp(Src) 98 F (36.7 C)  SpO2 96%

## 2015-09-24 ENCOUNTER — Encounter: Payer: Self-pay | Admitting: *Deleted

## 2015-09-24 ENCOUNTER — Ambulatory Visit
Admission: RE | Admit: 2015-09-24 | Discharge: 2015-09-24 | Disposition: A | Discharge status: Home / Self Care | Payer: Medicaid Other | Source: Ambulatory Visit | Attending: Radiation Oncology | Admitting: Radiation Oncology

## 2015-09-24 DIAGNOSIS — Z510 Encounter for antineoplastic radiation therapy: Secondary | ICD-10-CM | POA: Diagnosis not present

## 2015-09-27 ENCOUNTER — Ambulatory Visit
Admission: RE | Admit: 2015-09-27 | Discharge: 2015-09-27 | Disposition: A | Discharge status: Home / Self Care | Payer: Medicaid Other | Source: Ambulatory Visit | Attending: Radiation Oncology | Admitting: Radiation Oncology

## 2015-09-27 ENCOUNTER — Ambulatory Visit
Admission: RE | Admit: 2015-09-27 | Discharge: 2015-09-27 | Disposition: A | Discharge status: Home / Self Care | Payer: No Typology Code available for payment source | Source: Ambulatory Visit | Attending: Radiation Oncology | Admitting: Radiation Oncology

## 2015-09-27 ENCOUNTER — Other Ambulatory Visit: Payer: Medicaid Other

## 2015-09-27 VITALS — BP 109/61 | HR 70 | Temp 98.2°F | Resp 16 | Ht 67.0 in | Wt 132.0 lb

## 2015-09-27 DIAGNOSIS — Z510 Encounter for antineoplastic radiation therapy: Secondary | ICD-10-CM | POA: Diagnosis not present

## 2015-09-27 DIAGNOSIS — C321 Malignant neoplasm of supraglottis: Secondary | ICD-10-CM

## 2015-09-27 MED ORDER — METHYLPREDNISOLONE 4 MG PO TBPK
ORAL_TABLET | ORAL | Status: DC
Start: 2015-09-27 — End: 2015-10-18

## 2015-09-27 NOTE — Progress Notes (Signed)
[name] has completed 21 fractions to her supraglottis.  She denies pain today.  She has been having throbbing pain in her left ear that comes and goes.  She reports there is a knot by her left ear.  She has not made an appointment with her ENT doctor.  She reports her mouth is dry and she is spitting up thick saliva.  She reports she having a productive cough with white sputum.  She reports her throat is sore and she is swallowing softer foods.  She is drinking about 1 ensure per day.  She is taking Morphine bid, oxycodone q 3 hours, lidocaine and Carafate.  Her weight is down 2 lbs and she reports her appetite is poor.  Orthostatic vitals done: bp sitting 112/75, hr 84, standing bp 109/61, hr 70.  She reports having fatigue.  The skin on her neck is red.  She is using biafine.  BP 109/61 mmHg  Pulse 70  Temp(Src) 98.2 F (36.8 C) (Oral)  Resp 16  Ht 5\' 7"  (1.702 m)  Wt 132 lb (59.875 kg)  BMI 20.67 kg/m2  SpO2 100%   Wt Readings from Last 3 Encounters:  09/27/15 132 lb (59.875 kg)  09/20/15 134 lb 14.4 oz (61.19 kg)  09/17/15 133 lb 14.4 oz (60.737 kg)

## 2015-09-27 NOTE — Progress Notes (Signed)
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  CBCT/MVCT images/Port film x-rays were reviewed.  The chart was checked. Pain complaint - severe episodic pain and swelling in left jaw region/ left ear pain/left neck pain. No fever.  Physical Findings:  Wt Readings from Last 3 Encounters:  09/27/15 132 lb (59.875 kg)  09/20/15 134 lb 14.4 oz (61.19 kg)  09/17/15 133 lb 14.4 oz (60.737 kg)    height is 5\' 7"  (1.702 m) and weight is 132 lb (59.875 kg). Her oral temperature is 98.2 F (36.8 C). Her blood pressure is 109/61 and her pulse is 70. Her respiration is 16 and oxygen saturation is 100%.  modest left sided swelling in parotid region. Swelling is firm.  Oral cavity and tympanic membranes are clear.  Skin is erythematous in radiation fields.    CBC    Component Value Date/Time   WBC 8.1 07/15/2015 0113   WBC 7.6 07/14/2015 1021   RBC 3.92 07/15/2015 0113   RBC 4.21 07/14/2015 1021   HGB 12.2 07/15/2015 0156   HGB 13.2 07/14/2015 1021   HCT 36.0 07/15/2015 0156   HCT 38.2 07/14/2015 1021   PLT 236 07/15/2015 0113   PLT 253 07/14/2015 1021   MCV 90.3 07/15/2015 0113   MCV 91 07/14/2015 1021   MCH 31.1 07/15/2015 0113   MCH 31.4 07/14/2015 1021   MCHC 34.5 07/15/2015 0113   MCHC 34.6 07/14/2015 1021   RDW 13.7 07/15/2015 0113   RDW 13.5 07/14/2015 1021   LYMPHSABS 4.2* 07/15/2015 0113   LYMPHSABS 2.6 07/14/2015 1021   MONOABS 0.9 07/15/2015 0113   EOSABS 0.2 07/15/2015 0113   EOSABS 0.2 07/14/2015 1021   BASOSABS 0.0 07/15/2015 0113   BASOSABS 0.0 07/14/2015 1021     CMP     Component Value Date/Time   NA 138 07/15/2015 0156   NA 140 07/14/2015 1022   NA 140 03/08/2015 0832   K 3.9 07/15/2015 0156   K 3.9 07/14/2015  1022   K 3.9 03/08/2015 0832   CL 100* 07/15/2015 0156   CL 102 03/08/2015 0832   CO2 23 07/15/2015 0113   CO2 26 07/14/2015 1022   CO2 30 03/08/2015 0832   GLUCOSE 98 07/15/2015 0156   GLUCOSE 100 07/14/2015 1022   GLUCOSE 102 03/08/2015 0832   BUN 12 07/15/2015 0156   BUN 9.6 07/14/2015 1022   BUN 7 03/08/2015 0832   CREATININE 0.80 07/15/2015 0156   CREATININE 0.8 07/14/2015 1022   CREATININE 0.6 03/08/2015 0832   CALCIUM 9.2 07/15/2015 0113   CALCIUM 10.0 07/14/2015 1022   CALCIUM 9.6 03/08/2015 0832   PROT 7.6 07/14/2015 1022   PROT 7.3 03/08/2015 0832   PROT 5.8* 02/04/2015 1116   ALBUMIN 4.2 07/14/2015 1022   ALBUMIN 3.4 03/08/2015 0832   ALBUMIN 3.3* 02/04/2015 1116   AST 25 07/14/2015 1022   AST 28 03/08/2015 0832   AST 15 02/04/2015 1116   ALT 23 07/14/2015 1022   ALT 19 03/08/2015 0832   ALT 11 02/04/2015 1116   ALKPHOS 93 07/14/2015 1022   ALKPHOS 95* 03/08/2015 0832   ALKPHOS 126* 02/04/2015 1116   BILITOT 0.39 07/14/2015 1022   BILITOT 0.50 03/08/2015 0865  BILITOT 0.2 02/04/2015 1116   GFRNONAA >60 07/15/2015 0113   GFRAA >60 07/15/2015 0113     Impression:  The patient is tolerating radiotherapy.   Plan:  Continue radiotherapy as planned. Suspect parotiditis in setting of RT.  Will try Medrol Dose pak.  Patient will let me know if not better in 4 days, in which case we can refer to ENT.  -----------------------------------  Eppie Gibson, MD

## 2015-09-28 ENCOUNTER — Ambulatory Visit
Admission: RE | Admit: 2015-09-28 | Discharge: 2015-09-28 | Disposition: A | Discharge status: Home / Self Care | Payer: Medicaid Other | Source: Ambulatory Visit | Attending: Radiation Oncology | Admitting: Radiation Oncology

## 2015-09-28 DIAGNOSIS — Z510 Encounter for antineoplastic radiation therapy: Secondary | ICD-10-CM | POA: Diagnosis not present

## 2015-09-28 NOTE — Progress Notes (Signed)
  Oncology Nurse Navigator Documentation   Navigator Encounter Type: Treatment (09/24/15 1020) Patient Visit Type: Radonc (09/24/15 1020)     Met briefly with patient prior to Tomo RT. She reported she was "doing OK". She denied needs/concerns.  Gayleen Orem, RN, BSN, [place] at [place] [phone]                   Time Spent with Patient: 15 (09/24/15 1020)

## 2015-09-29 ENCOUNTER — Ambulatory Visit: Payer: Medicaid Other

## 2015-09-29 ENCOUNTER — Ambulatory Visit
Admission: RE | Admit: 2015-09-29 | Discharge: 2015-09-29 | Disposition: A | Discharge status: Home / Self Care | Payer: Medicaid Other | Source: Ambulatory Visit | Attending: Radiation Oncology | Admitting: Radiation Oncology

## 2015-09-29 ENCOUNTER — Telehealth: Payer: Self-pay | Admitting: *Deleted

## 2015-09-29 ENCOUNTER — Encounter: Payer: Self-pay | Admitting: *Deleted

## 2015-09-29 DIAGNOSIS — Z510 Encounter for antineoplastic radiation therapy: Secondary | ICD-10-CM | POA: Diagnosis not present

## 2015-09-29 NOTE — Telephone Encounter (Signed)
  Oncology Nurse Navigator Documentation   Navigator Encounter Type: Telephone (09/29/15 1045) Patient Visit Type: Follow-up (09/29/15 1045)       Interventions: Coordination of Care (09/29/15 1045)     Called patient in follow-up to email received by Tomo RTT indicating she called to cancel 1020 appt.  Spoke with her dtr [place]. She explained her mother was experiencing severe ear pain that has now resolved with PRN meds.  She is willing to come in for rescheduled 1440 tmt. Dtr indicated appt not yet scheduled with ENT for evaluation of ear pain as recommended.  I encouraged appt scheduling ASAP.  She verbalized understanding.  Gayleen Orem, RN, BSN, [place] at [place] [phone]            Time Spent with Patient: 15 (09/29/15 1045)

## 2015-09-30 ENCOUNTER — Ambulatory Visit (HOSPITAL_COMMUNITY): Admission: RE | Admit: 2015-09-30 | Payer: No Typology Code available for payment source | Source: Ambulatory Visit

## 2015-09-30 ENCOUNTER — Ambulatory Visit (HOSPITAL_COMMUNITY): Payer: No Typology Code available for payment source

## 2015-09-30 ENCOUNTER — Ambulatory Visit
Admission: RE | Admit: 2015-09-30 | Discharge: 2015-09-30 | Disposition: A | Discharge status: Home / Self Care | Payer: Medicaid Other | Source: Ambulatory Visit | Attending: Radiation Oncology | Admitting: Radiation Oncology

## 2015-09-30 DIAGNOSIS — Z510 Encounter for antineoplastic radiation therapy: Secondary | ICD-10-CM | POA: Diagnosis not present

## 2015-09-30 NOTE — Progress Notes (Signed)
  Oncology Nurse Navigator Documentation   Navigator Encounter Type: Treatment (09/29/15 1440) Patient Visit Type: JGYLUD (09/29/15 1440)     To provide support, encouragement and evaluate for needs, met [name] prior to rescheduled Tomo. I encouraged her to call me when she is not feeling well, having difficulty coming in for scheduled RT, as I am available to provide assistance.  We again discussed the importance of treatment continuity.  She indicated mornings lately have been difficult.  She finds it necessary to take pain medication and it takes time before she feels functional.  She was amenable to having RT appt moved to afternoon, I arranged with RTT Candace. She indicated she has not made appt with ENT in [place].  She provided me name, I will follow-up to facilitate appt.                   Time Spent with Patient: 15 (09/29/15 1440)

## 2015-10-01 ENCOUNTER — Ambulatory Visit: Payer: Medicaid Other

## 2015-10-01 ENCOUNTER — Ambulatory Visit
Admission: RE | Admit: 2015-10-01 | Discharge: 2015-10-01 | Disposition: A | Discharge status: Home / Self Care | Payer: Medicaid Other | Source: Ambulatory Visit | Attending: Radiation Oncology | Admitting: Radiation Oncology

## 2015-10-04 ENCOUNTER — Ambulatory Visit: Admission: RE | Admit: 2015-10-04 | Payer: Medicaid Other | Source: Ambulatory Visit

## 2015-10-04 ENCOUNTER — Ambulatory Visit
Admission: RE | Admit: 2015-10-04 | Discharge: 2015-10-04 | Disposition: A | Discharge status: Home / Self Care | Payer: No Typology Code available for payment source | Source: Ambulatory Visit | Attending: Radiation Oncology | Admitting: Radiation Oncology

## 2015-10-04 ENCOUNTER — Ambulatory Visit: Payer: Medicaid Other

## 2015-10-04 ENCOUNTER — Ambulatory Visit: Payer: Medicaid Other | Attending: Radiation Oncology

## 2015-10-04 NOTE — Progress Notes (Addendum)
I spoke with the patient's daughter today. Ms Hendriks told her daughter she is in too much pain to continue treatment and wants to stop RT. Her daughter reports an appt has been made at [facility] to examine her ear pain and left jaw/parotid region pain, which now causes headaches.   I called the pt, left voicemail asking her to call back, encouraging her to come in for an exam. My calls have not been return for the last 3 hours. I will ask Gayleen Orem, RN, our Head and Neck Oncology Navigator to call patient tomorrow to encourage her to come in.  I will also ask him to see if appt and [facility] can be moved up to expedite workup for patient's extreme pain.  As of last week, her exam suggested parotiditis but apparently she has not had improvement with my Rx for a steroid taper.  -----------------------------------  Eppie Gibson, MD

## 2015-10-05 ENCOUNTER — Ambulatory Visit: Payer: Medicaid Other

## 2015-10-05 ENCOUNTER — Ambulatory Visit
Admission: RE | Admit: 2015-10-05 | Discharge: 2015-10-05 | Disposition: A | Discharge status: Home / Self Care | Payer: Medicaid Other | Source: Ambulatory Visit | Attending: Radiation Oncology | Admitting: Radiation Oncology

## 2015-10-05 ENCOUNTER — Telehealth: Payer: Self-pay | Admitting: *Deleted

## 2015-10-05 NOTE — Telephone Encounter (Signed)
  Oncology Nurse Navigator Documentation   Navigator Encounter Type: Telephone (10/05/15 1134)   Treatment Phase: Other (10/05/15 1134)       Called Abigail Butts to inform of rescheduled ENT appt, check on wellbeing, encourage her to come for tmt today.  LVMM, asking her to return my call. Spoke with her dtr, [place], to inform of rescheduled ENT appt, she indicated she had just heard from [facility], she wd be sure her mom keeps appt. I asked about her mother's well being, her ability/willingness to come in for tmt today.  She replied her mother is adamant about discontinuing tmt.  I asked that she encourage her to come in today; she note she would try.  Gayleen Orem, RN, BSN, [place] at [place] [phone]                Time Spent with Patient: 15 (10/05/15 1134)

## 2015-10-05 NOTE — Telephone Encounter (Signed)
  Oncology Nurse Navigator Documentation   Navigator Encounter Type: Telephone (10/05/15 1117)   Treatment Phase: Other (10/05/15 1117)     Interventions: Coordination of Care (10/05/15 1117)     Spoke with Cassandria Santee [facility], re rescheduling [facility] 10/19/15 appt with Dr. Lamar Laundry bco worsening L ear pain/parotiditis.  She later called to inform she can be seen by Dr. Renato Battles this Thursday 10/07/15 1030, she will call patient.  I provided dtr's CP number as alternate contact.  Gayleen Orem, RN, BSN, [place] at [place] [phone]           Time Spent with Patient: 15 (10/05/15 1117)

## 2015-10-06 ENCOUNTER — Ambulatory Visit: Payer: Medicaid Other

## 2015-10-06 ENCOUNTER — Ambulatory Visit
Admission: RE | Admit: 2015-10-06 | Discharge: 2015-10-06 | Disposition: A | Discharge status: Home / Self Care | Payer: Medicaid Other | Source: Ambulatory Visit | Attending: Radiation Oncology | Admitting: Radiation Oncology

## 2015-10-07 ENCOUNTER — Telehealth: Payer: Self-pay | Admitting: Adult Health

## 2015-10-07 ENCOUNTER — Ambulatory Visit
Admission: RE | Admit: 2015-10-07 | Discharge: 2015-10-07 | Disposition: A | Discharge status: Home / Self Care | Payer: Medicaid Other | Source: Ambulatory Visit | Attending: Radiation Oncology | Admitting: Radiation Oncology

## 2015-10-07 ENCOUNTER — Ambulatory Visit: Payer: Medicaid Other

## 2015-10-07 NOTE — Telephone Encounter (Signed)
I called Ms. Rowland at the request of Dr. Sallye Lat to inquire about her pain and check on her well-being.  Her phone did not ring and went straight to voicemail.  I left a message, expressing concern for her and wanted to ensure her pain was well-controlled.  I offered to bring her in for an appointment to further evaluate her pain and symptoms and give her additional prescriptions, if necessary.  I let her know that we are all worried about her and asked that she please return my call so that we can ensure she is okay and figure out a plan on how best to help her.  Awaiting return call.   Mike Craze, NP [place] [phone]

## 2015-10-07 NOTE — Telephone Encounter (Signed)
Ms. Justiss called me back this afternoon.  She met with Dr. Angela Cox, ENT at [facility] today.  He told her that the "pea sized knot" by her ear that has been causing her so much pain, feels like a "build-up of tissue."  She told me that he did laryngoscopy and didn't find anything concerning there, except expected redness.  He told her that his suggestion was that if this area of concern was not in her treatment field, that he felt it warranted additional work-up with a scan.  If the area is in the treatment field, then he encouraged her to complete her treatment as directed.    I expressed concern for her and offered to bring her in to see me tomorrow, based on her availability. I have scheduled her to see me at 10:30am tomorrow morning.  She has been taking her MS Contin TID, instead of BID.  She's also taking the oxycodone IR every 3 hours.  Heating pad helps. She took the steroid dose pack and she says it did not help with her symptoms at all.  She also needs a refill of the Lidocaine as well.  I told her that I would see her, examine her, and have Dr. Isidore Moos pop in to see her as well and we would get her pain better controlled.  If we needed to do scans, we would discuss it tomorrow.  I reinforced the importance of completing her radiation treatments for her best chance at treating the cancer.  I'm hopeful we can get her to finish treatment if we can continue to help with pain.  She also mentioned that she only has a "driver" scheduled through tomorrow and does not have any transportation help lined up after tomorrow.  I will contact Abby Potash or Lauren and have them stop in tomorrow morning as well, if they're available.   Mike Craze, NP [place] [phone]

## 2015-10-08 ENCOUNTER — Ambulatory Visit: Admission: RE | Admit: 2015-10-08 | Payer: Medicaid Other | Source: Ambulatory Visit

## 2015-10-08 ENCOUNTER — Encounter: Payer: Self-pay | Admitting: Adult Health

## 2015-10-08 ENCOUNTER — Ambulatory Visit
Admission: RE | Admit: 2015-10-08 | Discharge: 2015-10-08 | Disposition: A | Discharge status: Home / Self Care | Payer: Medicaid Other | Source: Ambulatory Visit | Attending: Radiation Oncology | Admitting: Radiation Oncology

## 2015-10-08 ENCOUNTER — Ambulatory Visit: Payer: Medicaid Other

## 2015-10-08 ENCOUNTER — Ambulatory Visit (HOSPITAL_BASED_OUTPATIENT_CLINIC_OR_DEPARTMENT_OTHER): Payer: Medicaid Other | Admitting: Adult Health

## 2015-10-08 VITALS — BP 92/63 | HR 95 | Temp 98.2°F | Resp 14 | Wt 132.0 lb

## 2015-10-08 DIAGNOSIS — B370 Candidal stomatitis: Secondary | ICD-10-CM | POA: Diagnosis not present

## 2015-10-08 DIAGNOSIS — K5903 Drug induced constipation: Secondary | ICD-10-CM | POA: Diagnosis not present

## 2015-10-08 DIAGNOSIS — E860 Dehydration: Secondary | ICD-10-CM | POA: Diagnosis not present

## 2015-10-08 DIAGNOSIS — C321 Malignant neoplasm of supraglottis: Secondary | ICD-10-CM | POA: Diagnosis not present

## 2015-10-08 DIAGNOSIS — R52 Pain, unspecified: Secondary | ICD-10-CM | POA: Diagnosis not present

## 2015-10-08 MED ORDER — LIDOCAINE VISCOUS 2 % MT SOLN: OROMUCOSAL | Status: DC

## 2015-10-08 MED ORDER — OXYCODONE HCL 10 MG PO TABS: 10.0000 mg | ORAL_TABLET | ORAL | Status: DC | PRN

## 2015-10-08 MED ORDER — POLYETHYLENE GLYCOL 3350 17 G PO PACK: 17.0000 g | PACK | Freq: Every day | ORAL | Status: DC

## 2015-10-08 NOTE — Patient Instructions (Signed)
*  Take the long-acting morphine (Morphine ER) first thing in the morning and again at night 12 hours later.  You should take the Morphine ER whether you are having pain or not.   *The the oxycodone immediate-release every 3 hours for "spikes" in pain as needed.   *We are going to change your daily treatment times to 10:30am daily.  If this is not okay, I'll have the radiation therapists call you with a new daily treatmnet time.  *I will have our social workers and our chaplain call you.  *Start taking the Miralax daily to help prevent constipation.  *Drink Gatorade/Powerade, water, and non-caffeinated beverages as often as you can tolerate during the day.   --Call me anytime if you need me.    Mike Craze, NP [phone]

## 2015-10-08 NOTE — Progress Notes (Signed)
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  Her last completed treatment was on 09/30/15.  She reports that she stopped coming for her radiation treatments because of increasing left ear/head/neck pain.  She states that her biggest barrier to not coming to radiation is "I just don't feel good."    Pain---She describes this pain as throbbing, dull, and "deep inside."  She states that the pain gets as bad as 10/10 on pain scale.  She reports that she has felt a "pea-sized knot" behind her left ear lobe.  Dr. Isidore Moos treated this complaint with a Medrol dose pack, which did not offer her symptomatic relief.  She has been taking the MS Contin as often as TID, as well as the oxycodone IR every 3 hours, with little relief.  The heating pad to the left side of her head helps some.  She was evaluated by Dr. Angela Cox at [facility] yesterday and encouraged her to complete her XRT.  Dr. Lanae Boast note states for Dr. Isidore Moos to consider CT scan of area of concern, if appropriate, but his exam and laryngoscopy were not suspicious per his note.   He did prescribe Nystatin for her for oral thrush.   Nutrition---She is able to tolerate soft foods (yogurt, soft fruit) without complaints.  She reports drinking about 3 cups of caffeinated coffee per day, as well as "some water."  She has lost 2 additional pounds since her last visit here. She weighed 132 lb on 09/27/15 and weighs 130 lb today.  She is trying to supplement her nutrition with [employer] Breakfast or Ensure supplements, but they are difficult for her to afford.  She states that she is trying to work with [employer] of these companies for some extra assistance.   Social concerns---Ms. Garretson currently is living with her daughter and grandchildren, but that  relationship is becoming strained due to Ms. Donica's illness and treatment.  The patient states that she is going to be moving out of her daughter's home and isn't sure where she is going to live.  She states that living with her aunt is difficult, because she is "an older lady and she took care of me at the beginning of my chemo treatments and it was too much for her to handle."  Ms. Locker states that she is working with a Psychologist, sport and exercise of some kind who is helping assist her with housing.  Her transportation is being handled by [employer] volunteers and that is working out well, per patient.   REVIEW OF SYSTEMS:  Review of Systems  Constitutional: Positive for chills, weight loss and malaise/fatigue. Negative for fever.       Reports chills at home, but no fever.  "I think I've gotten a viral infection from my grandchildren."    HENT: Positive for ear pain and sore throat. Negative for hearing loss.        Left ear pain that radiates down left neck and up the side of head; "temporal headaches"   Gastrointestinal: Positive for constipation. Negative for nausea and vomiting.       LBM: "a few days ago"   Skin: Positive for rash.       Dry, peeling skin to right neck (in areas treated with radiation)  Neurological: Positive for headaches.    PHYSICAL EXAM:  Filed Vitals:   10/08/15 1030 10/08/15 1032  BP: 90/59 92/63  Pulse: 75 95  Temp: 98.2 F (36.8 C)   TempSrc: Oral   Resp: 14   Weight: 132 lb (59.875 kg)    General: Frail, thin female.  She is unaccompanied today.   HEENT: Head is atraumatic and normocephalic.  Pupils equal and reactive to light. Conjunctivae clear without exudate.  Sclerae anicteric. Oral mucosa is pink, moist. Tongue with white/yellow coating and multiple lesions.  Upper dentures in place.  Oropharynx is slight erythematous without lesions.  Pain to left side of head/ear with wide mouth opening.  Otoscopic exam-Left auricle intact and  normal. Left tympanic membrane visualized with very slight bulging/inner ear fluid.  Directly posterior to bottom of earlobe in mandible region, there is a palpable area of soft tissue; no solid mass is appreciated. No skin lesions in area of patient concern. Extremely tender on palpation.  Right auricle intact and normal. No ear drainage. Right tympanic membrane visualized and normal. No redness or edema in external auditory canal.  Neck: Right neck reddened and with dry desquamation.  Left neck less red and skin intact.  Lymph: No preauricular, postauricular, cervical, supraclavicular, or infraclavicular lymphadenopathy noted on palpation. Tenderness on palpation to left cervical nodes; no appreciable mass, adenopathy, or abnormality on physical exam.  Cardiovascular: Regular rate and rhythm. Respiratory: Clear to auscultation bilaterally. Chest expansion symmetric without accessory muscle use on inspiration or expiration.  GI: Abdomen soft and round. No tenderness to palpation. Bowel sounds hypoactive in 4 quadrants.  GU: Deferred. .  Neuro: No focal deficits. Steady gait.  Psych: Mood and affect normal and appropriate for situation.  Extremities: No edema, cyanosis, or clubbing.  Skin: Dry, decreased turgor.     ASSESSMENT & PLAN:   1. Cancer of the supraglottis: Ms. Ezra had a lengthy discussion on the importance of completing her prescribed radiation treatments in order to have the best possible outcomes.  I commended her on what she has completed thus far and we explored different motivational techniques to help "keep her going" for the remainder of her treatment course.  Her faith and her family/grandchildren offer her great comfort and encouragement to complete her treatment.  I notified Dr. Isidore Moos and the radiation therapists of Ms. Westcott's wishes not to have treatment today (despite my encouragement to do so), but she is agreeable to come to treatment on Monday, 10/11/15. She has  been scheduled to arrive by 1:30 pm for a treatment time of 2 pm.  I wrote this on Ms. Rocks's AVS today.  I will make arrangements for the patient to see Dr. Isidore Moos in clinic after her treatment on Monday.  The patient expressed verbal understanding and agrees to this plan.   2. Pain: Upon further investigation, Ms. Bixler has been taking her pain medications incorrectly.  It appears that she has been taking both the MS Contin and the oxycodone IR on an as needed basis.  She said, "I thought that when my pain was worse, that I was supposed to take the stronger morphine medicine and when it wasn't as bad, I should take the oxycodone."  Before discovering this, I was inclined to increase her MS Contin.  However, we reviewed, at length, that she should take her MS Contin Q12H, regardless if she is experiencing pain or not.  I explained the pharmacodynamics of this medication and how it is designed to help manage her pain over several hours.  She should take  the oxycodone only for "spikes" in pain and only every 3 hours, as needed.  I refilled her oxycodone 10mg  Q3Hprn (#60) today, given that she has been taking this medication incorrectly and her supply was running low (I viewed both her MS Contin and oxycodone medication bottles in the office today). I also refilled her lidocaine 2% solution for mouth pain. I am hopeful that if she takes these medications as directed, her need for the breakthrough oxycodone with decrease.  If not, adjustments can be made her to MS Contin for more effective pain management.  Ms. Addair was able to teach back to me how her pain medication should be taken and verbally understands this plan.   3. Fluid-volume and nutritional deficit: I encouraged Ms. Reich to drink as much fluid (non-caffeinated) as she can tolerate.  Given her decreased BP and reported decreased intake, she is clinically dehydrated, but is largely without symptoms of orthostasis.  Orthostatic BPs were  grossly negative as well.  I recommended that she drink as much Gatorade or Powerade that she can tolerate because of the added electrolytes that Ms. Laubscher needs.  I also helped arrange for Ms. Marines to get a few samples of the [employer], along with some product coupons, from [employer], our oncology dietitian.   4. Constipation secondary to opiates and dehydration: Given the patient's use of opiates, she is at risk for constipation.  I let her know that she should be having bowel movements daily when taking pain medications. She is currently only taking Senna. I prescribed for her to also take Miralax daily, along with the Senna, for treatment of likely current constipation as well as prevention of constipation in the future.  Also, encouraged her to continue to increase her fluid intake to additionally help combat constipation.   5. Oral thrush: Continue Nystatin, as prescribed.   6. Social support: Ms. Lattner is currently experiencing some social difficulties with regards to her living situation and family relationships.  I have spoken with Polo Riley, LCSW about possible resources that may be available for the patient for housing. Lauren is going to look into this further and I have asked that she follow-up with the patient sometime next week to check on her.  I have also reached out to Lorrin Jackson, our lead chaplain here at the cancer center.  Ms. Fahmy was able to identify that her faith brings her great comfort during difficult times and Lattie Haw will be able to offer the patient additional support during her treatment and beyond.     Ms. Lepage has my contact information and was encouraged to call me anytime with questions or concerns.  She is aware that she will have XRT on Monday, 10/11/15 at 2pm (arriving at 1:30pm) and will see Dr. Isidore Moos after treatment.  Our oncology social worker and chaplain will be in touch with the patient for additional support and resources.   I encouraged Ms. Foor to go to her closest ER if she experiences any concerning symptoms or status changes over the weekend.  She was encouraged to ask questions and all questions were answered to her satisfaction.  She knows that she can call me and I will be happy to see her again, as needed.      Mike Craze, NP [place] [phone]

## 2015-10-10 ENCOUNTER — Encounter (HOSPITAL_COMMUNITY): Payer: Self-pay | Admitting: Vascular Surgery

## 2015-10-10 ENCOUNTER — Emergency Department (HOSPITAL_COMMUNITY)
Admission: EM | Admit: 2015-10-10 | Discharge: 2015-10-10 | Disposition: A | Discharge status: Home / Self Care | Payer: Medicaid Other | Attending: Emergency Medicine | Admitting: Emergency Medicine

## 2015-10-10 DIAGNOSIS — Y998 Other external cause status: Secondary | ICD-10-CM | POA: Insufficient documentation

## 2015-10-10 DIAGNOSIS — S00262A Insect bite (nonvenomous) of left eyelid and periocular area, initial encounter: Secondary | ICD-10-CM | POA: Diagnosis present

## 2015-10-10 DIAGNOSIS — F319 Bipolar disorder, unspecified: Secondary | ICD-10-CM | POA: Insufficient documentation

## 2015-10-10 DIAGNOSIS — S0086XA Insect bite (nonvenomous) of other part of head, initial encounter: Secondary | ICD-10-CM | POA: Insufficient documentation

## 2015-10-10 DIAGNOSIS — Z720 Tobacco use: Secondary | ICD-10-CM | POA: Diagnosis not present

## 2015-10-10 DIAGNOSIS — B20 Human immunodeficiency virus [HIV] disease: Secondary | ICD-10-CM | POA: Diagnosis not present

## 2015-10-10 DIAGNOSIS — Z79899 Other long term (current) drug therapy: Secondary | ICD-10-CM | POA: Diagnosis not present

## 2015-10-10 DIAGNOSIS — W57XXXA Bitten or stung by nonvenomous insect and other nonvenomous arthropods, initial encounter: Secondary | ICD-10-CM | POA: Diagnosis not present

## 2015-10-10 DIAGNOSIS — Y9389 Activity, other specified: Secondary | ICD-10-CM | POA: Insufficient documentation

## 2015-10-10 DIAGNOSIS — Z8521 Personal history of malignant neoplasm of larynx: Secondary | ICD-10-CM | POA: Diagnosis not present

## 2015-10-10 DIAGNOSIS — S0006XA Insect bite (nonvenomous) of scalp, initial encounter: Secondary | ICD-10-CM | POA: Diagnosis not present

## 2015-10-10 DIAGNOSIS — Z880 Allergy status to penicillin: Secondary | ICD-10-CM | POA: Diagnosis not present

## 2015-10-10 DIAGNOSIS — Y9289 Other specified places as the place of occurrence of the external cause: Secondary | ICD-10-CM | POA: Insufficient documentation

## 2015-10-10 DIAGNOSIS — R220 Localized swelling, mass and lump, head: Secondary | ICD-10-CM

## 2015-10-10 MED ORDER — DIPHENHYDRAMINE HCL 50 MG/ML IJ SOLN
25.0000 mg | Freq: Once | INTRAMUSCULAR | Status: AC
  Administered 2015-10-10: 25 mg via INTRAVENOUS
  Filled 2015-10-10: qty 1

## 2015-10-10 MED ORDER — DIPHENHYDRAMINE HCL 25 MG PO TABS: 25.0000 mg | ORAL_TABLET | Freq: Four times a day (QID) | ORAL | Status: DC

## 2015-10-10 MED ORDER — HEPARIN SOD (PORK) LOCK FLUSH 100 UNIT/ML IV SOLN
500.0000 [IU] | Freq: Once | INTRAVENOUS | Status: AC
  Administered 2015-10-10: 500 [IU]
  Filled 2015-10-10: qty 5

## 2015-10-10 MED ORDER — OXYCODONE HCL 5 MG PO TABS
10.0000 mg | ORAL_TABLET | Freq: Once | ORAL | Status: AC
  Administered 2015-10-10: 10 mg via ORAL
  Filled 2015-10-10: qty 2

## 2015-10-10 MED ORDER — PREDNISONE 20 MG PO TABS: 40.0000 mg | ORAL_TABLET | Freq: Every day | ORAL | Status: DC

## 2015-10-10 MED ORDER — FAMOTIDINE 20 MG PO TABS
40.0000 mg | ORAL_TABLET | Freq: Once | ORAL | Status: AC
  Administered 2015-10-10: 40 mg via ORAL
  Filled 2015-10-10: qty 2

## 2015-10-10 MED ORDER — DEXAMETHASONE SODIUM PHOSPHATE 10 MG/ML IJ SOLN
10.0000 mg | Freq: Once | INTRAMUSCULAR | Status: AC
  Administered 2015-10-10: 10 mg via INTRAVENOUS
  Filled 2015-10-10: qty 1

## 2015-10-10 MED ORDER — KETOROLAC TROMETHAMINE 30 MG/ML IJ SOLN
30.0000 mg | Freq: Once | INTRAMUSCULAR | Status: AC
  Administered 2015-10-10: 30 mg via INTRAVENOUS
  Filled 2015-10-10: qty 1

## 2015-10-10 MED ORDER — FAMOTIDINE 20 MG PO TABS: 20.0000 mg | ORAL_TABLET | Freq: Two times a day (BID) | ORAL | Status: DC

## 2015-10-10 NOTE — Discharge Instructions (Signed)
Please read and follow all provided instructions.  Your diagnoses today include:  1. Insect bite   2. Left facial swelling     Tests performed today include:  Vital signs. See below for your results today.   Medications prescribed:   Prednisone - steroid medicine   It is best to take this medication in the morning to prevent sleeping problems. If you are diabetic, monitor your blood sugar closely and stop taking Prednisone if blood sugar is over 300. Take with food to prevent stomach upset.    Benadryl (diphenhydramine) - antihistamine  You can find this medication over-the-counter.   DO NOT exceed:   50mg  Benadryl every 6 hours    Benadryl will make you drowsy. DO NOT drive or perform any activities that require you to be awake and alert if taking this.   Pepcid (famotidine) - antihistamine  You can find this medication over-the-counter.   DO NOT exceed:   20mg  Pepcid every 12 hours  Take any prescribed medications only as directed.  Home care instructions:   Follow any educational materials contained in this packet  Follow-up instructions: Please follow-up with your primary care provider in the next 3 days for further evaluation of your symptoms.   Return instructions:   Please return to the Emergency Department if you experience worsening symptoms.   Call 9-1-1 immediately if you have an allergic reaction that involves your lips, mouth, throat or if you have any difficulty breathing. This is a life-threatening emergency.   Please return if you have any other emergent concerns.  Additional Information:  Your vital signs today were: BP 101/62 mmHg   Pulse 65   Temp(Src) 98.2 F (36.8 C) (Oral)   Resp 16   SpO2 98% If your blood pressure (BP) was elevated above 135/85 this visit, please have this repeated by your doctor within one month. --------------

## 2015-10-10 NOTE — ED Provider Notes (Signed)
CSN: [id number]     Arrival date & time 10/10/15  1758 History   First MD Initiated Contact with Patient 10/10/15 1901     Chief Complaint  Patient presents with  . Insect Bite  . Allergic Reaction    (Consider location/radiation/quality/duration/timing/severity/associated sxs/prior Treatment) HPI Comments: Patient with history of HIV, bipolar disorder -- presents with complaint of L eye lid, forehead, and scalp swelling beginning acutely at 11:30AM when she was stung by a wasp in the same area. She took benadryl and applied honey without relief. No history of severe reactions to bee stings. She denies any difficulty breathing, lip or tongue swelling, lightheadedness, shortness of breath, vomiting or diarrhea. Course is constant. Nothing makes symptoms better or worse.  The history is provided by the patient and medical records.    Past Medical History  Diagnosis Date  . HIV (human immunodeficiency virus infection) ([place])   . HIV (human immunodeficiency virus infection) ([place])   . HCV (hepatitis C virus)     chronic   . Bipolar 1 disorder ([place])   . 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 Cesarean section    . Laryngoscopy  12/31/14   No family history on file. Social History  Substance Use Topics  . Smoking status: Current Every Day Smoker -- 0.00 packs/day for 32 years    Types: Cigarettes    Start date: 01/13/1986  . Smokeless tobacco: Never Used     Comment: quit smoking December 2015  . Alcohol Use: No   OB History    No data available     Review of Systems  Constitutional: Negative for fever.  HENT: Positive for facial swelling. Negative for trouble swallowing.   Eyes: Negative for redness.  Respiratory: Negative for shortness of breath, wheezing and stridor.   Cardiovascular: Negative for chest pain.  Gastrointestinal: Negative for nausea and vomiting.  Musculoskeletal: Negative for myalgias.   Skin: Negative for rash.  Neurological: Negative for light-headedness.  Psychiatric/Behavioral: Negative for confusion.      Allergies  Ancef; Penicillin g; and Ciprofloxacin  Home Medications   Prior to Admission medications   Medication Sig Start Date End Date Taking? Authorizing Provider  ascorbic acid (VITAMIN C) 500 MG tablet Take 500 mg by mouth daily.    Historical Provider, MD  b complex vitamins tablet Take 1 tablet by mouth daily.    Historical Provider, MD  diazepam (VALIUM) 5 MG tablet TAKE 1 TABLET BY MOUTH EVERY 8 HOURS AS NEEDED FOR ANXIETY 08/27/15   Volanda Napoleon, MD  diclofenac sodium (VOLTAREN) 1 % GEL Apply 2 g topically as needed.  06/03/15   Historical Provider, MD  DULoxetine (CYMBALTA) 30 MG capsule Take 1 cap daily for one week, then increase to two caps daily. 04/15/15 04/14/16  Historical Provider, MD  emollient (BIAFINE) cream Apply 90 g topically as needed.    Historical Provider, MD  Emtricitab-Rilpivir-Tenofov DF (COMPLERA) 200-25-300 MG TABS TAKE 1 TABLET BY MOUTH EVERY DAY WITH FOOD 04/05/15   Historical Provider, MD  ferrous fumarate (HEMOCYTE - 106 MG FE) 325 (106 FE) MG TABS tablet Take 1 tablet by mouth daily.    Historical Provider, MD  fluconazole (DIFLUCAN) 100 MG tablet Take 2 tablets today, then 1 tablet daily x 20 more days. 09/20/15   Eppie Gibson, MD  ibuprofen (ADVIL,MOTRIN) 600 MG tablet Take 600 mg by mouth as needed.    Historical  Provider, MD  lidocaine (XYLOCAINE) 2 % solution Patient: Mix 1part 2% viscous lidocaine, 1part H20. Swish and/or swallow 27mL of this mixture, 20min before meals and at bedtime, up to QID 10/08/15   Holley Bouche, NP  methylPREDNISolone (MEDROL DOSEPAK) 4 MG TBPK tablet Use as directed. 09/27/15   Eppie Gibson, MD  morphine (MS CONTIN) 30 MG 12 hr tablet Take 1 tablet (30 mg total) by mouth every 12 (twelve) hours. 09/20/15   Eppie Gibson, MD  Nutritional Supplements (ENSURE COMPLETE SHAKE) LIQD Take 1 Bottle by  mouth 2 (two) times daily. 08/27/15   Volanda Napoleon, MD  ondansetron (ZOFRAN) 8 MG tablet Take 1 tablet (8 mg total) by mouth every 8 (eight) hours as needed for nausea or vomiting. 09/17/15   Kyung Rudd, MD  ondansetron (ZOFRAN-ODT) 4 MG disintegrating tablet Take 4 mg by mouth every 8 (eight) hours as needed for nausea or vomiting.    Historical Provider, MD  Oxycodone HCl 10 MG TABS Take 1 tablet (10 mg total) by mouth every 3 (three) hours as needed. 10/08/15   Holley Bouche, NP  polyethylene glycol [facility]) packet Take 17 g by mouth daily. 10/08/15   Holley Bouche, NP  pregabalin (LYRICA) 50 MG capsule Take 50 mg by mouth 3 (three) times daily.  06/03/15 06/02/16  Historical Provider, MD  sucralfate (CARAFATE) 1 G tablet Dissolve 1 tablet in 10 mL H20 and swallow up to QID 09/06/15   Eppie Gibson, MD  varenicline (CHANTIX) 0.5 MG tablet Take 1 tablet (0.5 mg total) by mouth 2 (two) times daily. Take one tablet daily for days 1-3. Then one tablet two times per day for days 4-7. Then start two tablets two times per day for remainder of therapy. Patient not taking: Reported on 09/27/2015 02/15/15   Volanda Napoleon, MD   BP 106/66 mmHg  Pulse 84  Temp(Src) 98.2 F (36.8 C) (Oral)  Resp 16  SpO2 96%   Physical Exam  Constitutional: She appears well-developed and well-nourished.  HENT:  Head: Normocephalic and atraumatic.  Right Ear: External ear normal.  Left Ear: External ear normal.  Mouth/Throat: Oropharynx is clear and moist.  Patients with significant swelling of the left upper/lower eyelid, left forehead, left frontal and parietal scalp. No overlying erythema or cellulitis. No angioedema.  Eyes: Conjunctivae are normal. Pupils are equal, round, and reactive to light. Right eye exhibits no discharge. Left eye exhibits no discharge.  Exam of left globe is difficult due to pronounced upper and lower eye lid swelling. Central sclera is white and noninjected. Iris normal appearing. No  hyphema noted. Pupil is reactive to light.  Neck: Normal range of motion. Neck supple.  Cardiovascular: Normal rate, regular rhythm and normal heart sounds.   No murmur heard. Pulmonary/Chest: Effort normal and breath sounds normal.  Abdominal: Soft. Bowel sounds are normal. There is no tenderness. There is no rebound and no guarding.  Neurological: She is alert.  Skin: Skin is warm and dry.  Psychiatric: She has a normal mood and affect.  Nursing note and vitals reviewed.   ED Course  Procedures (including critical care time) Labs Review Labs Reviewed - No data to display  Imaging Review No results found. I have personally reviewed and evaluated these images and lab results as part of my medical decision-making.   EKG Interpretation None       7:42 PM Patient seen and examined. No signs of anaphylaxis. Will treat patient symptomatically.  Vital signs  reviewed and are as follows: BP 106/66 mmHg  Pulse 84  Temp(Src) 98.2 F (36.8 C) (Oral)  Resp 16  SpO2 96%  9:42 PM forehead swelling slightly improved. Patient still has significant upper and lower eyelid swelling. She states that her pain is better with Toradol.  Will give oral pain medication prior to discharge (patient is on morphine and oxycodone chronically). Will discharge to home with prednisone, Pepcid, Benadryl.  Pt urged to return with worsening pain, worsening swelling, expanding area of redness or streaking up extremity, fever, or any other concerns. Pt verbalizes understanding and agrees with plan.   MDM   Final diagnoses:  Insect bite  Left facial swelling   Patient with L upper and lower eyelid swelling after wasp sting. It does not appear cellulitic or like an abscess. Globe of eye difficult to see, but appears normal. Mild improvement with treatment in ED. No indications for antibiotics. Feel that she is safe for discharge with expectant management.   No dangerous or life-threatening conditions  suspected or identified by history, physical exam, and by work-up. No indications for hospitalization identified.      Carlisle Cater, PA-C 10/10/15 [place], MD 10/10/15 [phone]

## 2015-10-10 NOTE — ED Notes (Signed)
Pt reports to the ED for eval of left periorbital pain adn swelling following being stung by a wasp on the eyelid. Denies any SOB and states no oral/airway involvement. She has tried Benadryl without relief. Pt A&Ox4, resp e/u, and skin warm and dry.

## 2015-10-11 ENCOUNTER — Telehealth: Payer: Self-pay | Admitting: Adult Health

## 2015-10-11 ENCOUNTER — Ambulatory Visit: Payer: Medicaid Other

## 2015-10-11 ENCOUNTER — Ambulatory Visit: Payer: Medicaid Other | Admitting: Infectious Diseases

## 2015-10-11 ENCOUNTER — Ambulatory Visit: Payer: No Typology Code available for payment source | Attending: Radiation Oncology | Admitting: Radiation Oncology

## 2015-10-11 ENCOUNTER — Encounter: Payer: Self-pay | Admitting: Radiation Oncology

## 2015-10-11 NOTE — Telephone Encounter (Signed)
I left Ms. Disano a voicemail asking her to return my call regarding her missed XRT appt today that was rescheduled and confirmed on Friday when we met together.  I let her know that we are concerned for her well-being, as well as her pain, and asked that she please return my call.    Mike Craze, NP [place] [phone]

## 2015-10-11 NOTE — Telephone Encounter (Signed)
[name] returned my call and left a voicemail letting me know about her recent visit to the ER on 10/10/15 for a wasp sting.  She stated that due to all of the medication they gave her to help with the swelling, etc. (including Benadryl), she was too sleepy to take the bus and had no other means of transportation to get to treatment today.  She did let me know that she does have transportation arranged for tomorrow afternoon.   I called [name] back and left her a voicemail, letting her know that based on her previous request, we had changed her treatment time to mid-morning. Her treatment for tomorrow is scheduled for 10:20am.  I asked that she please return my call with the exact time that she could be at the cancer center tomorrow for her treatment so that I could let the radiation therapists know and they could try to work her in.  Awaiting her return call.   Mike Craze, NP [place] [phone]

## 2015-10-12 ENCOUNTER — Ambulatory Visit
Admission: RE | Admit: 2015-10-12 | Discharge: 2015-10-12 | Disposition: A | Discharge status: Home / Self Care | Payer: Medicaid Other | Source: Ambulatory Visit | Attending: Radiation Oncology | Admitting: Radiation Oncology

## 2015-10-12 ENCOUNTER — Ambulatory Visit: Payer: Medicaid Other

## 2015-10-12 ENCOUNTER — Encounter: Payer: Self-pay | Admitting: Adult Health

## 2015-10-12 ENCOUNTER — Telehealth: Payer: Self-pay | Admitting: Adult Health

## 2015-10-12 ENCOUNTER — Telehealth: Payer: Self-pay

## 2015-10-12 DIAGNOSIS — Z510 Encounter for antineoplastic radiation therapy: Secondary | ICD-10-CM | POA: Diagnosis not present

## 2015-10-12 NOTE — Telephone Encounter (Signed)
Ms. Merkle never returned my 2nd call re: her availability to come in for XRT today.  I called to leave her an additional voicemail this morning expressing concern and asking that she return my call so that we can get her treatments rescheduled based on her stated availability.  Awaiting return call.   Mike Craze, NP [place] [phone]

## 2015-10-12 NOTE — Progress Notes (Signed)
Ms. Goodloe checked in for radiation and was treated today.  The radiation therapists assisted the patient with rescheduling her remaining 11 days of treatment while Ms. Goering was on the phone with her transportation coordinator from the [employer].  Ms. Vanantwerp was given a new calendar with her daily treatment times until her last day of treatment scheduled for 10/26/15.    I briefly followed up with Ms. Savitz regarding her pain.  She is now taking the MS Contin as directed (Q12H) and is only requiring 3-4 oxycodone doses for breakthrough pain.  She states that her bowels are moving since adding the Miralax.  She states that she is motivated to finish her treatments "because I know I need to do this and my daughter really worries about me when I'm not getting treatments for my cancer."  I encouraged her to continue treatment for herself and for her family and to please let me know if barriers come up that could impact her ability to complete treatment.  She expressed verbal understanding of her plan of care and voiced appreciation for everyone's help.    Mike Craze, NP [place] [phone]

## 2015-10-13 ENCOUNTER — Encounter: Payer: Self-pay | Admitting: *Deleted

## 2015-10-13 ENCOUNTER — Ambulatory Visit
Admission: RE | Admit: 2015-10-13 | Discharge: 2015-10-13 | Disposition: A | Discharge status: Home / Self Care | Payer: Medicaid Other | Source: Ambulatory Visit | Attending: Radiation Oncology | Admitting: Radiation Oncology

## 2015-10-13 DIAGNOSIS — Z510 Encounter for antineoplastic radiation therapy: Secondary | ICD-10-CM | POA: Diagnosis not present

## 2015-10-13 NOTE — Progress Notes (Signed)
  Oncology Nurse Navigator Documentation   Navigator Encounter Type: Treatment (10/13/15 0945) Patient Visit Type: Radonc (10/13/15 0945)     Met briefly with [name] after her [facility] tmt. She reported:  Ear pain well managed taking MS Contin BID per Survivorship Gretchen Dawson's guidance.  Certain she will be able to complete remaining RT, encouraged today hearing 2 patient's ring bell. She understands she can contact [place] or myself with needs/concerns.  Gayleen Orem, RN, BSN, [place] [facility] [place] [phone]                    Time Spent with Patient: 15 (10/13/15 0945)

## 2015-10-14 ENCOUNTER — Ambulatory Visit: Payer: Medicaid Other

## 2015-10-14 ENCOUNTER — Ambulatory Visit: Payer: Medicaid Other | Admitting: Internal Medicine

## 2015-10-14 ENCOUNTER — Ambulatory Visit
Admission: RE | Admit: 2015-10-14 | Discharge: 2015-10-14 | Disposition: A | Discharge status: Home / Self Care | Payer: Medicaid Other | Source: Ambulatory Visit | Attending: Radiation Oncology | Admitting: Radiation Oncology

## 2015-10-14 DIAGNOSIS — Z510 Encounter for antineoplastic radiation therapy: Secondary | ICD-10-CM | POA: Diagnosis not present

## 2015-10-15 ENCOUNTER — Ambulatory Visit: Payer: Medicaid Other

## 2015-10-15 ENCOUNTER — Telehealth: Payer: Self-pay | Admitting: Adult Health

## 2015-10-15 NOTE — Telephone Encounter (Signed)
Radiation therapists called to make me aware that [name] has not shown for her 2:40pm treatment time and is now >30 mins late for her scheduled time.   I called Ms. Ellenwood and left a voicemail expressing concern and asking her to return my call as soon as she's able.  Awaiting return call.   Mike Craze, NP [place] [phone]

## 2015-10-18 ENCOUNTER — Ambulatory Visit: Payer: Medicaid Other

## 2015-10-18 ENCOUNTER — Telehealth: Payer: Self-pay | Admitting: *Deleted

## 2015-10-18 ENCOUNTER — Ambulatory Visit
Admission: RE | Admit: 2015-10-18 | Discharge: 2015-10-18 | Disposition: A | Discharge status: Home / Self Care | Payer: Medicaid Other | Source: Ambulatory Visit | Attending: Radiation Oncology | Admitting: Radiation Oncology

## 2015-10-18 ENCOUNTER — Encounter: Payer: Self-pay | Admitting: Radiation Oncology

## 2015-10-18 VITALS — BP 118/70 | HR 86 | Temp 98.1°F | Ht 67.0 in | Wt 127.1 lb

## 2015-10-18 DIAGNOSIS — C321 Malignant neoplasm of supraglottis: Secondary | ICD-10-CM | POA: Diagnosis not present

## 2015-10-18 DIAGNOSIS — Z510 Encounter for antineoplastic radiation therapy: Secondary | ICD-10-CM | POA: Diagnosis not present

## 2015-10-18 MED ORDER — SONAFINE EX EMUL
1.0000 "application " | Freq: Once | CUTANEOUS | Status: AC
  Administered 2015-10-18: 1 via TOPICAL
  Filled 2015-10-18: qty 45

## 2015-10-18 NOTE — Progress Notes (Signed)
Weekly Management Note:  Outpatient    ICD-9-CM ICD-10-CM   1. 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  CBCT/MVCT images/Port film x-rays were reviewed.  The chart was checked.  She resumed RT last week after a 12 day break.  This break was not recommended, but she felt poorly (pain and flu-like sx) and did not come in.  Now, feeling much better. Pain free and taking long acting narcotics as prescribed. Dry mouth Seen prior to RT today.   Physical Findings:  Wt Readings from Last 3 Encounters:  10/18/15 127 lb 1.6 oz (57.652 kg)  10/08/15 132 lb (59.875 kg)  09/27/15 132 lb (59.875 kg)    height is 5\' 7"  (1.702 m) and weight is 127 lb 1.6 oz (57.652 kg). Her temperature is 98.1 F (36.7 C). Her blood pressure is 118/70 and her pulse is 86.   The  left sided swelling in parotid region has resolved. Skin is erythematous and intact in radiation fields.   No oral thrush. No mucositis  CBC    Component Value Date/Time   WBC 8.1 07/15/2015 0113   WBC 7.6 07/14/2015 1021   RBC 3.92 07/15/2015 0113   RBC 4.21 07/14/2015 1021   HGB 12.2 07/15/2015 0156   HGB 13.2 07/14/2015 1021   HCT 36.0 07/15/2015 0156   HCT 38.2 07/14/2015 1021   PLT 236 07/15/2015 0113   PLT 253 07/14/2015 1021   MCV 90.3 07/15/2015 0113   MCV 91 07/14/2015 1021   MCH 31.1 07/15/2015 0113   MCH 31.4 07/14/2015 1021   MCHC 34.5 07/15/2015 0113   MCHC 34.6 07/14/2015 1021   RDW 13.7 07/15/2015 0113   RDW 13.5 07/14/2015 1021   LYMPHSABS 4.2* 07/15/2015 0113   LYMPHSABS 2.6 07/14/2015 1021   MONOABS 0.9 07/15/2015 0113   EOSABS 0.2 07/15/2015 0113   EOSABS 0.2 07/14/2015 1021   BASOSABS 0.0 07/15/2015 0113   BASOSABS 0.0 07/14/2015 1021     CMP     Component Value Date/Time   NA 138 07/15/2015 0156   NA 140 07/14/2015 1022   NA 140 03/08/2015 0832   K 3.9  07/15/2015 0156   K 3.9 07/14/2015 1022   K 3.9 03/08/2015 0832   CL 100* 07/15/2015 0156   CL 102 03/08/2015 0832   CO2 23 07/15/2015 0113   CO2 26 07/14/2015 1022   CO2 30 03/08/2015 0832   GLUCOSE 98 07/15/2015 0156   GLUCOSE 100 07/14/2015 1022   GLUCOSE 102 03/08/2015 0832   BUN 12 07/15/2015 0156   BUN 9.6 07/14/2015 1022   BUN 7 03/08/2015 0832   CREATININE 0.80 07/15/2015 0156   CREATININE 0.8 07/14/2015 1022   CREATININE 0.6 03/08/2015 0832   CALCIUM 9.2 07/15/2015 0113   CALCIUM 10.0 07/14/2015 1022   CALCIUM 9.6 03/08/2015 0832   PROT 7.6 07/14/2015 1022   PROT 7.3 03/08/2015 0832   PROT 5.8* 02/04/2015 1116   ALBUMIN 4.2 07/14/2015 1022   ALBUMIN 3.4 03/08/2015 0832   ALBUMIN 3.3* 02/04/2015 1116   AST 25 07/14/2015 1022   AST 28 03/08/2015 0832   AST 15 02/04/2015 1116   ALT 23 07/14/2015 1022   ALT 19 03/08/2015 0832   ALT 11 02/04/2015 1116   ALKPHOS 93 07/14/2015 1022   ALKPHOS 95* 03/08/2015 0832   ALKPHOS 126*  02/04/2015 1116   BILITOT 0.39 07/14/2015 1022   BILITOT 0.50 03/08/2015 0832   BILITOT 0.2 02/04/2015 1116   GFRNONAA >60 07/15/2015 0113   GFRAA >60 07/15/2015 0113     Impression:  The patient is tolerating radiotherapy.   Plan:  Continue radiotherapy as planned.   -----------------------------------  Eppie Gibson, MD

## 2015-10-18 NOTE — Addendum Note (Signed)
Encounter addended by: Margaret Pyle, [place] on: 10/18/2015  3:07 PM<BR>     Documentation filed: Rx Order Verification

## 2015-10-18 NOTE — Progress Notes (Signed)
[name] is here for her 27th fraction of radiation to her supraglottis and bilateral neck.  She reports her pain is much better, she is taking her MSContin twice a day, and her oxycodone 4 times a day with complete relief of her pain 0/10 today. The skin of her neck is slightly red, and there is dryness noted to the right side of her neck. She is using her biafine cream as directed. She reports a poor appetite, and says she fills up quickly after a little amount of food. She is consuming softer foods, and has ensure/carnation shakes during the daytime also. She states she eats a lot of cereals softened in milk.   BP 118/70 mmHg  Pulse 86  Temp(Src) 98.1 F (36.7 C)  Ht 5\' 7"  (1.702 m)  Wt 127 lb 1.6 oz (57.652 kg)  BMI 19.90 kg/m2   Orthostatics: BP sitting 118/70, pulse 86 and BP standing 101/62, pulse 92.  Wt Readings from Last 3 Encounters:  10/18/15 127 lb 1.6 oz (57.652 kg)  10/08/15 132 lb (59.875 kg)  09/27/15 132 lb (59.875 kg)

## 2015-10-18 NOTE — Telephone Encounter (Signed)
  Oncology Nurse Navigator Documentation   Called patient in follow-up to missed RT appt last week Friday. She reported she has had the flu, has been taking Tamiflu, feels much better, denies fever. She indicated she will be keeping her 11:20 [facility] appt this morning. Mike Craze, Survivorship and [facility] notified.  Gayleen Orem, RN, BSN, [place] [facility] [place] [phone]

## 2015-10-18 NOTE — Addendum Note (Signed)
Encounter addended by: Ernst Spell, RN on: 10/18/2015  4:01 PM<BR>     Documentation filed: Inpatient MAR

## 2015-10-18 NOTE — Addendum Note (Signed)
Encounter addended by: Ernst Spell, RN on: 10/18/2015  3:02 PM<BR>     Documentation filed: [employer], Orders

## 2015-10-19 ENCOUNTER — Ambulatory Visit
Admission: RE | Admit: 2015-10-19 | Discharge: 2015-10-19 | Disposition: A | Discharge status: Home / Self Care | Payer: Medicaid Other | Source: Ambulatory Visit | Attending: Radiation Oncology | Admitting: Radiation Oncology

## 2015-10-19 ENCOUNTER — Ambulatory Visit: Payer: Medicaid Other

## 2015-10-19 DIAGNOSIS — Z510 Encounter for antineoplastic radiation therapy: Secondary | ICD-10-CM | POA: Diagnosis not present

## 2015-10-20 ENCOUNTER — Ambulatory Visit: Payer: Medicaid Other

## 2015-10-20 ENCOUNTER — Ambulatory Visit
Admission: RE | Admit: 2015-10-20 | Discharge: 2015-10-20 | Disposition: A | Discharge status: Home / Self Care | Payer: Medicaid Other | Source: Ambulatory Visit | Attending: Radiation Oncology | Admitting: Radiation Oncology

## 2015-10-20 DIAGNOSIS — Z510 Encounter for antineoplastic radiation therapy: Secondary | ICD-10-CM | POA: Diagnosis not present

## 2015-10-21 ENCOUNTER — Telehealth: Payer: Self-pay | Admitting: Adult Health

## 2015-10-21 ENCOUNTER — Ambulatory Visit: Payer: Medicaid Other

## 2015-10-21 ENCOUNTER — Ambulatory Visit
Admission: RE | Admit: 2015-10-21 | Discharge: 2015-10-21 | Disposition: A | Discharge status: Home / Self Care | Payer: Medicaid Other | Source: Ambulatory Visit | Attending: Radiation Oncology | Admitting: Radiation Oncology

## 2015-10-21 ENCOUNTER — Other Ambulatory Visit: Payer: Self-pay | Admitting: *Deleted

## 2015-10-21 DIAGNOSIS — C321 Malignant neoplasm of supraglottis: Secondary | ICD-10-CM | POA: Insufficient documentation

## 2015-10-21 DIAGNOSIS — C329 Malignant neoplasm of larynx, unspecified: Secondary | ICD-10-CM

## 2015-10-21 NOTE — Telephone Encounter (Signed)
I received a call from [place], RT regarding Ms. Deason's missed XRT appointment today; Creed Copper had been unable to reach Ms. Chatham by phone to inquire about her missed appointment. [place] and [place], RT were able to reach Ms. Bree's daughter who was at work and encouraged our staff to continue to keep calling her mom to inquire about her missed appt.   I called Ms. Stanco and was able to speak with her directly.  She tells me that she is not feeling well today and has had cough, congestion, fever (although she has not reportedly taken her temperature), and chills.  She also does not have a ride to the cancer center today.  She states that she does not feel comfortable driving herself to treatment because she recently took Theraflu and was concerned about her safety with that medication and driving.    I offered concern for Ms. Bassette's reported symptoms and offered to have her come in at some point today or tomorrow to be evaluated, if she was able to find a ride.  I let her know that I would be willing to call her in some antibiotics, if necessary, but I would want to examine her to ensure she does not have something more serious (like pneumonia), which would affect my medical management of her illness.  She expressed understanding and stated that she prefers to stay home today and will try to come to her scheduled treatment tomorrow.  She states that she does have transportation arranged for tomorrow.  I encouraged her to call me and let me know how she is feeling tomorrow and I would be happy to see her & call in any necessary prescriptions to help her feel better.    Also, according to [place], RT, Ms. Dotson's daughter asked for a return call if we were able to reach her mother regarding the patient's missed appt today.  I called and spoke with the patient's daughter and let her know that I had spoken with her mom and she was not feeling well today and was going to try to come to her scheduled  treatment tomorrow.  I gave Ms. Harton's daughter my direct office number and encouraged her to call me with any additional questions or concerns regarding her mom.  I relayed this information to the [facility] radiation therapists and advised them to cancel the patient's treatment time scheduled for today per patient's wishes.   Mike Craze, NP [place] [phone]

## 2015-10-22 ENCOUNTER — Other Ambulatory Visit: Payer: Self-pay | Admitting: Adult Health

## 2015-10-22 ENCOUNTER — Ambulatory Visit: Payer: Medicaid Other

## 2015-10-22 ENCOUNTER — Encounter: Payer: Self-pay | Admitting: Adult Health

## 2015-10-22 ENCOUNTER — Ambulatory Visit
Admission: RE | Admit: 2015-10-22 | Discharge: 2015-10-22 | Disposition: A | Discharge status: Home / Self Care | Payer: Medicaid Other | Source: Ambulatory Visit | Attending: Radiation Oncology | Admitting: Radiation Oncology

## 2015-10-22 ENCOUNTER — Encounter: Payer: Self-pay | Admitting: General Practice

## 2015-10-22 DIAGNOSIS — C321 Malignant neoplasm of supraglottis: Secondary | ICD-10-CM | POA: Diagnosis present

## 2015-10-22 DIAGNOSIS — C329 Malignant neoplasm of larynx, unspecified: Secondary | ICD-10-CM

## 2015-10-22 DIAGNOSIS — J209 Acute bronchitis, unspecified: Secondary | ICD-10-CM

## 2015-10-22 DIAGNOSIS — J208 Acute bronchitis due to other specified organisms: Secondary | ICD-10-CM

## 2015-10-22 MED ORDER — AZITHROMYCIN 250 MG PO TABS: ORAL_TABLET | ORAL | Status: DC

## 2015-10-22 MED ORDER — AZITHROMYCIN 250 MG PO TABS: 250.0000 mg | ORAL_TABLET | Freq: Every day | ORAL | Status: DC

## 2015-10-22 MED ORDER — AZITHROMYCIN 250 MG PO TABS: 500.0000 mg | ORAL_TABLET | Freq: Every day | ORAL | Status: DC

## 2015-10-22 NOTE — Progress Notes (Signed)
[name] asked the nurse to see me today before her scheduled radiation treatment.  She is continuing to have productive cough, fever/chills (she could not find her thermometer to check her temperature).  She states that these symptoms have been ongoing for over 2 weeks, with slight improvement in symptoms initially and then worsening symptoms.  She has been taking OTC Theraflu, which has helped some with her cough.  She does still live at her daughter's house with young grandchildren. She reports that her granddaughter has recently been ill "with some sort of cold or something."  NP witnessed barking, productive cough during this encounter.   Physical Exam:  Lungs: Posterior chest, clear to auscultation bilaterally.  Some mild rhonchi noted in anterior upper chest.    Heart: Regular rate and rhythm.   Assessment & Plan:  1. Bronchitis: Likely as a result of recent supposed viral illness, that has persisted and may now have superimposed bacterial infection in the upper airway.  Azithromycin (Z-pak) prescribed for patient (500 mg once today, the 250 mg daily starting tomorrow and continue x 4 days).  Prescription e-prescribed to [facility] on [address] as requested by pt.   I let [name] know that if her symptoms did not improve with this course of antibiotics, then she may need further evaluation with chest x-ray and longer course of antibiotics and/or steroids.  I encouraged her to call me if she did not begin to feel better over the weekend and she agreed.  She has my direct office number to call me, if needed.  Dr. Isidore Moos made aware of patient's assessment and plan today.   Mike Craze, NP [place] [phone]

## 2015-10-22 NOTE — Progress Notes (Signed)
Spiritual Care Note  Reached Ms Schoenfeld by phone per referral from Mike Craze, NP.  Per pt, she is coping much better now that she has "just four more treatments; I can see the light at the end of the tunnel."  Provided introduction to services, pastoral presence, and encouragement to practice self-care (including how to honor/celebrate the conclusion of radiation).  She was receptive to phone call and plans to call me back if time allows this afternoon; we also plan to meet in person prior to her rad tx on Monday morning.  Please also page as needs arise.  Thank you.  [place], [place], [facility] Pager [phone] Voicemail [phone]

## 2015-10-22 NOTE — Progress Notes (Signed)
[name] asked the nurse to see me today before her scheduled radiation treatment.  She is continuing to have productive cough, fever/chills (she could not find her thermometer to check her temperature).  She states that these symptoms have been ongoing for over 2 weeks, with slight improvement in symptoms initially and then worsening symptoms.  She has been taking OTC Theraflu, which has helped some with her cough.  She does still live at her daughter's house with young grandchildren. She reports that her granddaughter has recently been ill "with some sort of cold or something."  NP witnessed barking, productive cough during this encounter.   Physical Exam:  Lungs: Posterior chest, clear to auscultation bilaterally.  Some mild rhonchi noted in anterior upper chest.    Heart: Regular rate and rhythm.   Assessment & Plan:  1. Bronchitis: Likely as a result of recent supposed viral illness, that has persisted and may now have superimposed bacterial infection in the upper airway.  Azithromycin (Z-pak) prescribed for patient (500 mg once today, the 250 mg daily starting tomorrow and continue x 4 days).  Prescription e-prescribed to [facility] on [address] as requested by pt.   I let [name] know that if her symptoms did not improve with this course of antibiotics, then she may need further evaluation with chest x-ray and longer course of antibiotics and/or steroids.  I encouraged her to call me if she did not begin to feel better over the weekend and she agreed.  She has my direct office number to call me, if needed.  Dr. Isidore Moos made aware of patient's assessment and plan today.   Mike Craze, NP [place] [phone]

## 2015-10-25 ENCOUNTER — Encounter: Payer: Self-pay | Admitting: Adult Health

## 2015-10-25 ENCOUNTER — Ambulatory Visit: Payer: Medicaid Other

## 2015-10-25 ENCOUNTER — Ambulatory Visit
Admission: RE | Admit: 2015-10-25 | Discharge: 2015-10-25 | Disposition: A | Discharge status: Home / Self Care | Payer: Medicaid Other | Source: Ambulatory Visit | Attending: Radiation Oncology | Admitting: Radiation Oncology

## 2015-10-25 ENCOUNTER — Encounter: Payer: Self-pay | Admitting: Radiation Oncology

## 2015-10-25 ENCOUNTER — Ambulatory Visit
Admission: RE | Admit: 2015-10-25 | Discharge: 2015-10-25 | Disposition: A | Discharge status: Home / Self Care | Payer: No Typology Code available for payment source | Source: Ambulatory Visit | Attending: Radiation Oncology | Admitting: Radiation Oncology

## 2015-10-25 VITALS — BP 98/45 | HR 72 | Temp 98.1°F | Ht 67.0 in | Wt 129.7 lb

## 2015-10-25 DIAGNOSIS — C321 Malignant neoplasm of supraglottis: Secondary | ICD-10-CM | POA: Diagnosis not present

## 2015-10-25 MED ORDER — MORPHINE SULFATE ER 30 MG PO TBCR: 30.0000 mg | EXTENDED_RELEASE_TABLET | Freq: Two times a day (BID) | ORAL | Status: DC

## 2015-10-25 MED ORDER — OXYCODONE HCL 10 MG PO TABS: 10.0000 mg | ORAL_TABLET | ORAL | Status: DC | PRN

## 2015-10-25 NOTE — Progress Notes (Signed)
[name] presents for her 32nd fraction of radiation to her Supraglottis and Bilateral neck. She reports she is eating better. She is eating peanut butter, whole grain bread, and cream of wheat. She chews her food well, but mostly stays with softer foods she doesn't need to chew a lot. She eating soups, ensure (once or twice a day), and drinking water as much as possible. She thinks about the steps of swallowing to make sure she is careful while drinking/eating. She complains of pain in her throat a 2/10 which she is taking MScontin 30mg  twice a day, and oxycodone 6-8 times daily. She complains of aching in her Left ear which is worse at some times than others. She reports some mild fatigue, but is able to go for walks, and help her daughter around the house as much as possible.   BP 98/45 mmHg  Pulse 72  Temp(Src) 98.1 F (36.7 C)  Ht 5\' 7"  (1.702 m)  Wt 129 lb 11.2 oz (58.832 kg)  BMI 20.31 kg/m2  Orthostatics: BP sitting 98/45, pusle 72 and BP standing 116/75,  Pulse 82.   Wt Readings from Last 3 Encounters:  10/25/15 129 lb 11.2 oz (58.832 kg)  10/18/15 127 lb 1.6 oz (57.652 kg)  10/08/15 132 lb (59.875 kg)

## 2015-10-25 NOTE — Progress Notes (Signed)
Weekly Management Note:  Outpatient    ICD-9-CM ICD-10-CM   1. 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  CBCT/MVCT images/Port film x-rays were reviewed.  The chart was checked.    Ms. Pigeon presents for her 32nd fraction of radiation to her Supraglottis and Bilateral neck. She reports she is eating better. She is eating peanut butter, whole grain bread, and cream of wheat. She chews her food well, but mostly stays with softer foods she doesn't need to chew a lot. She eating soups, ensure (once or twice a day), and drinking water as much as possible. She thinks about the steps of swallowing to make sure she is careful while drinking/eating. She complains of pain in her throat a 2/10 which she is taking MScontin 30mg  twice a day, and oxycodone 6-8 times daily. She complains of aching in her Left ear which is worse at some times than others. She reports some mild fatigue, but is able to go for walks, and help her daughter around the house as much as possible.     Orthostatics: BP sitting 98/45, pulse 72 and BP standing 116/75,  Pulse 82.       Physical Findings:  Wt Readings from Last 3 Encounters:  10/25/15 129 lb 11.2 oz (58.832 kg)  10/18/15 127 lb 1.6 oz (57.652 kg)  10/08/15 132 lb (59.875 kg)    height is 5\' 7"  (1.702 m) and weight is 129 lb 11.2 oz (58.832 kg). Her temperature is 98.1 F (36.7 C). Her blood pressure is 98/45 and her pulse is 72.    Skin is erythematous and intact in radiation fields.   No oral thrush. No significant mucositis  CBC    Component Value Date/Time   WBC 8.1 07/15/2015 0113   WBC 7.6 07/14/2015 1021   RBC 3.92 07/15/2015 0113   RBC 4.21 07/14/2015 1021   HGB 12.2 07/15/2015 0156   HGB 13.2 07/14/2015 1021   HCT 36.0 07/15/2015 0156   HCT  38.2 07/14/2015 1021   PLT 236 07/15/2015 0113   PLT 253 07/14/2015 1021   MCV 90.3 07/15/2015 0113   MCV 91 07/14/2015 1021   MCH 31.1 07/15/2015 0113   MCH 31.4 07/14/2015 1021   MCHC 34.5 07/15/2015 0113   MCHC 34.6 07/14/2015 1021   RDW 13.7 07/15/2015 0113   RDW 13.5 07/14/2015 1021   LYMPHSABS 4.2* 07/15/2015 0113   LYMPHSABS 2.6 07/14/2015 1021   MONOABS 0.9 07/15/2015 0113   EOSABS 0.2 07/15/2015 0113   EOSABS 0.2 07/14/2015 1021   BASOSABS 0.0 07/15/2015 0113   BASOSABS 0.0 07/14/2015 1021     CMP     Component Value Date/Time   NA 138 07/15/2015 0156   NA 140 07/14/2015 1022   NA 140 03/08/2015 0832   K 3.9 07/15/2015 0156   K 3.9 07/14/2015 1022   K 3.9 03/08/2015 0832   CL 100* 07/15/2015 0156   CL 102 03/08/2015 0832   CO2 23 07/15/2015 0113   CO2 26 07/14/2015 1022   CO2 30 03/08/2015 0832   GLUCOSE 98 07/15/2015 0156   GLUCOSE 100 07/14/2015 1022   GLUCOSE 102 03/08/2015 0832   BUN 12 07/15/2015 0156   BUN 9.6 07/14/2015 1022   BUN  7 03/08/2015 0832   CREATININE 0.80 07/15/2015 0156   CREATININE 0.8 07/14/2015 1022   CREATININE 0.6 03/08/2015 0832   CALCIUM 9.2 07/15/2015 0113   CALCIUM 10.0 07/14/2015 1022   CALCIUM 9.6 03/08/2015 0832   PROT 7.6 07/14/2015 1022   PROT 7.3 03/08/2015 0832   PROT 5.8* 02/04/2015 1116   ALBUMIN 4.2 07/14/2015 1022   ALBUMIN 3.4 03/08/2015 0832   ALBUMIN 3.3* 02/04/2015 1116   AST 25 07/14/2015 1022   AST 28 03/08/2015 0832   AST 15 02/04/2015 1116   ALT 23 07/14/2015 1022   ALT 19 03/08/2015 0832   ALT 11 02/04/2015 1116   ALKPHOS 93 07/14/2015 1022   ALKPHOS 95* 03/08/2015 0832   ALKPHOS 126* 02/04/2015 1116   BILITOT 0.39 07/14/2015 1022   BILITOT 0.50 03/08/2015 0832   BILITOT 0.2 02/04/2015 1116   GFRNONAA >60 07/15/2015 0113   GFRAA >60 07/15/2015 0113     Impression:  The patient is tolerating radiotherapy.   Plan:  Continue radiotherapy as planned. F/u in 1 week after  RT.  -----------------------------------  Eppie Gibson, MD

## 2015-10-25 NOTE — Progress Notes (Signed)
I briefly saw Ms. Hagenow today as she was waiting for Dr. Isidore Moos to see her for her weekly under treatment visit.  She tells me that the antibiotic (Z-pak that I prescribed on Friday) is helping and she is feeling a little better.  She reports being very tired and is looking forward to finishing treatment on Thursday, 10/28/15.  I offered continued encouragement and support. She knows to call me with any questions or concerns.   Mike Craze, NP [place] [phone]

## 2015-10-26 ENCOUNTER — Ambulatory Visit: Payer: Medicaid Other

## 2015-10-26 ENCOUNTER — Ambulatory Visit
Admission: RE | Admit: 2015-10-26 | Discharge: 2015-10-26 | Disposition: A | Discharge status: Home / Self Care | Payer: Medicaid Other | Source: Ambulatory Visit | Attending: Radiation Oncology | Admitting: Radiation Oncology

## 2015-10-26 DIAGNOSIS — C321 Malignant neoplasm of supraglottis: Secondary | ICD-10-CM | POA: Diagnosis not present

## 2015-10-27 ENCOUNTER — Ambulatory Visit: Payer: Medicaid Other

## 2015-10-27 ENCOUNTER — Encounter: Payer: Self-pay | Admitting: Gastroenterology

## 2015-10-27 ENCOUNTER — Ambulatory Visit (INDEPENDENT_AMBULATORY_CARE_PROVIDER_SITE_OTHER): Payer: Medicaid Other | Admitting: Gastroenterology

## 2015-10-27 ENCOUNTER — Other Ambulatory Visit (INDEPENDENT_AMBULATORY_CARE_PROVIDER_SITE_OTHER): Payer: Medicaid Other

## 2015-10-27 ENCOUNTER — Ambulatory Visit
Admission: RE | Admit: 2015-10-27 | Discharge: 2015-10-27 | Disposition: A | Discharge status: Home / Self Care | Payer: Medicaid Other | Source: Ambulatory Visit | Attending: Radiation Oncology | Admitting: Radiation Oncology

## 2015-10-27 VITALS — BP 100/70 | HR 72 | Ht 67.0 in | Wt 127.2 lb

## 2015-10-27 DIAGNOSIS — B1920 Unspecified viral hepatitis C without hepatic coma: Secondary | ICD-10-CM | POA: Diagnosis not present

## 2015-10-27 DIAGNOSIS — K921 Melena: Secondary | ICD-10-CM

## 2015-10-27 DIAGNOSIS — C321 Malignant neoplasm of supraglottis: Secondary | ICD-10-CM | POA: Diagnosis not present

## 2015-10-27 LAB — CBC WITH DIFFERENTIAL/PLATELET
BASOS PCT: 0.5 % (ref 0.0–3.0)
Basophils Absolute: 0 10*3/uL (ref 0.0–0.1)
EOS PCT: 2.1 % (ref 0.0–5.0)
Eosinophils Absolute: 0.1 10*3/uL (ref 0.0–0.7)
HEMATOCRIT: 40.6 % (ref 36.0–46.0)
HEMOGLOBIN: 13.7 g/dL (ref 12.0–15.0)
LYMPHS PCT: 9.2 % — AB (ref 12.0–46.0)
Lymphs Abs: 0.4 10*3/uL — ABNORMAL LOW (ref 0.7–4.0)
MCHC: 33.8 g/dL (ref 30.0–36.0)
MCV: 91.1 fl (ref 78.0–100.0)
MONO ABS: 0.5 10*3/uL (ref 0.1–1.0)
MONOS PCT: 13.3 % — AB (ref 3.0–12.0)
Neutro Abs: 2.9 10*3/uL (ref 1.4–7.7)
Neutrophils Relative %: 74.9 % (ref 43.0–77.0)
Platelets: 218 10*3/uL (ref 150.0–400.0)
RBC: 4.46 Mil/uL (ref 3.87–5.11)
RDW: 13.1 % (ref 11.5–15.5)
WBC: 3.9 10*3/uL — AB (ref 4.0–10.5)

## 2015-10-27 LAB — COMPREHENSIVE METABOLIC PANEL
ALT: 23 U/L (ref 0–35)
AST: 23 U/L (ref 0–37)
Albumin: 4.1 g/dL (ref 3.5–5.2)
Alkaline Phosphatase: 103 U/L (ref 39–117)
BILIRUBIN TOTAL: 0.4 mg/dL (ref 0.2–1.2)
BUN: 4 mg/dL — ABNORMAL LOW (ref 6–23)
CHLORIDE: 102 meq/L (ref 96–112)
CO2: 29 meq/L (ref 19–32)
Calcium: 10.1 mg/dL (ref 8.4–10.5)
Creatinine, Ser: 0.57 mg/dL (ref 0.40–1.20)
GFR: 118.86 mL/min (ref >60.00)
Glucose, Bld: 102 mg/dL — ABNORMAL HIGH (ref 70–99)
Potassium: 4.4 mEq/L (ref 3.5–5.1)
SODIUM: 138 meq/L (ref 135–145)
Total Protein: 7.1 g/dL (ref 6.0–8.3)

## 2015-10-27 LAB — PROTIME-INR
INR: 1 ratio (ref 0.8–1.0)
PROTHROMBIN TIME: 11.5 s (ref 9.6–13.1)

## 2015-10-27 MED ORDER — OMEPRAZOLE 40 MG PO CPDR: 40.0000 mg | DELAYED_RELEASE_CAPSULE | Freq: Every day | ORAL | Status: DC

## 2015-10-27 NOTE — Patient Instructions (Addendum)
You will have labs checked today in the basement lab.  Please head down after you check out with the front desk  (cbc, cmet, inr). You will be set up for an ultrasound to check for cirrhosis. Try to stop the advil that you take every day. Ompeprazole 40mg  pill, one pill once daily (20-30 min before eating). You may need upper endoscopy if your blood counts are low. You do not need colon cancer screening currently (given all your other medical issues currently).  You have been scheduled for an abdominal ultrasound at [facility] (1st floor of hospital) on 11-01-15 at [place]. Please arrive 15 minutes prior to your appointment for registration. Make certain not to have anything to eat or drink 6 hours prior to your appointment. Should you need to reschedule your appointment, please contact radiology at [phone]. This test typically takes about 30 minutes to perform.

## 2015-10-27 NOTE — Progress Notes (Signed)
HPI: This is a   very pleasant 49 year old woman   who was referred to me by Smothers, Andree Elk, NP  to discuss colonoscopy for colon cancer screening however at the time this visit she has many other GI issues that take precedence.  Chief complaint is dark stools, constipation  HIV, HCV coinfection;   Not sure if her mother had colon cancer, but it does not sound like it.  Her sister had gastric tumors, s/p   No red blood in her stool  She started having black colored stools about a week ago.  Has been constipated a lot lately.  Tried miralax, then senna.  Up to 2 miralax doses.    Several weeks of constipation for the past 3 weeks.  THis is not new for her.  She is on narcotic for variety of pains (head, neck, back) many predate her cancer diagnosis but certainly doses have increased with cancer pains   Takes 600mg  ibuprofen nightly, no other nsaids.  Swallowing trouble since she started radiation.    Has vomiting occasionally.  No overt bleeding.  She is on HIV medicine complera started about a year ago, through [place].  Never treated for hepatitis C.  Stopped drinking, was an alcohol.  None in 3 years.  Review of systems: Pertinent positive and negative review of systems were noted in the above HPI section. Complete review of systems was performed and was otherwise normal.   Past Medical History  Diagnosis Date  . HIV (human immunodeficiency virus infection) ([place])   . HIV (human immunodeficiency virus infection) ([place])   . HCV (hepatitis C virus)     chronic   . Bipolar 1 disorder ([place])   . Cancer ([place]) 12/31/2014    squamous cell of larynx  . Alcohol abuse   . Anemia   . Anxiety   . Depression     Past Surgical History  Procedure Laterality Date  . Cesarean section      x 3  . Laryngoscopy  12/31/14    Current Outpatient Prescriptions  Medication Sig Dispense Refill  . ascorbic acid (VITAMIN C) 500 MG tablet Take 500 mg by mouth daily.     Marland Kitchen b complex vitamins tablet Take 1 tablet by mouth every other day.     . diazepam (VALIUM) 5 MG tablet TAKE 1 TABLET BY MOUTH EVERY 8 HOURS AS NEEDED FOR ANXIETY 90 tablet 0  . diclofenac sodium (VOLTAREN) 1 % GEL Apply 2 g topically as needed (for neck pain).     . DULoxetine (CYMBALTA) 30 MG capsule TAKES 60MG  BY MOUTH ONCE DAILY    . emollient (BIAFINE) cream Apply 90 g topically 4 (four) times daily as needed (FOR RADIATION).     Marland Kitchen ibuprofen (ADVIL,MOTRIN) 600 MG tablet Take 1,800 mg by mouth at bedtime.     . lidocaine (XYLOCAINE) 2 % solution Patient: Mix 1part 2% viscous lidocaine, 1part H20. Swish and/or swallow 67mL of this mixture, 59min before meals and at bedtime, up to QID 100 mL 5  . morphine (MS CONTIN) 30 MG 12 hr tablet Take 1 tablet (30 mg total) by mouth every 12 (twelve) hours. 60 tablet 0  . Nutritional Supplements (ENSURE COMPLETE SHAKE) LIQD Take 1 Bottle by mouth 2 (two) times daily. 60 Bottle 12  . ondansetron (ZOFRAN) 8 MG tablet Take 1 tablet (8 mg total) by mouth every 8 (eight) hours as needed for nausea or vomiting. 30 tablet 1  . Oxycodone HCl  10 MG TABS Take 1 tablet (10 mg total) by mouth every 3 (three) hours as needed. 60 tablet 0  . polyethylene glycol (MIRALAX) packet Take 17 g by mouth daily. 14 each 6  . pregabalin (LYRICA) 50 MG capsule Take 50 mg by mouth 3 (three) times daily.     . sucralfate (CARAFATE) 1 G tablet Dissolve 1 tablet in 10 mL H20 and swallow up to QID 90 tablet 5   No current facility-administered medications for this visit.    Allergies as of 10/27/2015 - Review Complete 10/27/2015  Allergen Reaction Noted  . Ancef [cefazolin] Hives 01/20/2015  . Ciprofloxacin Hives 09/21/2008  . Penicillin g Rash 07/22/2015  . Wasp venom Swelling 10/10/2015    Family History  Problem Relation Age of Onset  . Stomach cancer Sister   . Heart disease Paternal Grandfather   . Alzheimer's disease Paternal Grandmother   . Heart disease Maternal  Grandmother   . Stroke Maternal Grandmother   . Other Mother     part of intestines taken out    Social History   Social History  . Marital Status: Widowed    Spouse Name: N/A  . Number of Children: 3  . Years of Education: N/A   Occupational History  . customer service    Social History Main Topics  . Smoking status: Current Every Day Smoker -- 0.00 packs/day for 32 years    Types: Cigarettes    Start date: 01/13/1986  . Smokeless tobacco: Never Used     Comment: quit smoking December 2015  . Alcohol Use: No  . Drug Use: No  . Sexual Activity: Not on file   Other Topics Concern  . Not on file   Social History Narrative     Physical Exam: BP 100/70 mmHg  Pulse 72  Ht 5\' 7"  (1.702 m)  Wt 127 lb 4 oz (57.72 kg)  BMI 19.93 kg/m2 Constitutional: generally well-appearing Psychiatric: alert and oriented x3 Eyes: extraocular movements intact Mouth: oral pharynx moist, no lesions Neck: supple no lymphadenopathy Cardiovascular: heart regular rate and rhythm Lungs: clear to auscultation bilaterally Abdomen: soft, nontender, nondistended, no obvious ascites, no peritoneal signs, normal bowel sounds Extremities: no lower extremity edema bilaterally Skin: no lesions on visible extremities   Assessment and plan: 49 y.o. female with  dark stools, chronic constipation, high NSAID use daily, hepatitis C, HIV coinfection, former alcoholic, currently getting radiation treatment for throat cancer  She was originally sent here to discuss colonoscopy for colon cancer screening however I think colon cancer screening is the least of her worries from a GI standpoint currently. She is undergoing radiation for throat cancer, last dose should be tomorrow. She has had usual dysphagia related to this, nystatin for thrush at one point. Today she mentioned she is having black stools for the past week or so. She also has HIV, hepatitis C coinfection and she is a former alcoholic. It is not  clear to me she has cirrhosis underlying. I don't see labs drawn in our system in the past 2 months and can't tell she is anemic. She takes 600 mg of ibuprofen daily and is not on any antiacid medicines.  I recommended first that she stop her NSAIDs which she takes daily. Second she is going to start omeprazole 40 mg once daily. Next she is going to have basic set of labs including a CBC, complete metabolic profile and coags significant anemia she will need upper endoscopy in the next several days.  She is clearly not having any current active GI bleeding fortunately but melena past few days does suggest slow, or intermittent GI oozing.   Owens Loffler, MD [facility] 10/27/2015, 10:37 AM  Cc: Smothers, Andree Elk, NP

## 2015-10-28 ENCOUNTER — Encounter: Payer: Self-pay | Admitting: Adult Health

## 2015-10-28 ENCOUNTER — Ambulatory Visit
Admission: RE | Admit: 2015-10-28 | Discharge: 2015-10-28 | Disposition: A | Discharge status: Home / Self Care | Payer: Medicaid Other | Source: Ambulatory Visit | Attending: Radiation Oncology | Admitting: Radiation Oncology

## 2015-10-28 ENCOUNTER — Telehealth: Payer: Self-pay | Admitting: Gastroenterology

## 2015-10-28 ENCOUNTER — Encounter: Payer: Self-pay | Admitting: *Deleted

## 2015-10-28 ENCOUNTER — Encounter: Payer: Self-pay | Admitting: Radiation Oncology

## 2015-10-28 DIAGNOSIS — C321 Malignant neoplasm of supraglottis: Secondary | ICD-10-CM | POA: Diagnosis not present

## 2015-10-28 NOTE — Telephone Encounter (Signed)
Pt has been advised that results are are not available.  She will be called after Dr Ardis Hughs reviews

## 2015-10-28 NOTE — Progress Notes (Signed)
I was present today as [name] completed her last radiation treatment. I congratulated her and was with her as she "rang the bell!"  She will see Dr. Isidore Moos for routine follow-up on Friday, 11/05/15.  I encouraged her to continue to call me with any questions or concerns in the meantime.   Mike Craze, NP [place] [phone]

## 2015-10-29 NOTE — Progress Notes (Signed)
  Oncology Nurse Navigator Documentation   Navigator Encounter Type: Treatment (10/28/15 1000) Patient Visit Type: BRKVTX (10/28/15 1000) Treatment Phase: Final Radiation Tx (10/28/15 1000)     Met with pt during final RT to offer support and to celebrate end of radiation treatment.  I explained that my role as navigator will continue for several more months and that I will be calling and/or joining her during follow-up visits.   I encouraged her to call me with needs/concerns.   Patient and wife verbalized understanding of information provided.  Gayleen Orem, RN, BSN, [place] at [place] [phone]                 Time Spent with Patient: 30 (10/28/15 1000)

## 2015-10-31 ENCOUNTER — Other Ambulatory Visit: Payer: Self-pay | Admitting: Hematology & Oncology

## 2015-11-01 ENCOUNTER — Ambulatory Visit (HOSPITAL_COMMUNITY): Payer: Medicaid Other

## 2015-11-03 ENCOUNTER — Other Ambulatory Visit: Payer: Self-pay | Admitting: *Deleted

## 2015-11-03 ENCOUNTER — Ambulatory Visit (HOSPITAL_COMMUNITY)
Admission: RE | Admit: 2015-11-03 | Discharge: 2015-11-03 | Disposition: A | Discharge status: Home / Self Care | Payer: Medicaid Other | Source: Ambulatory Visit | Attending: Gastroenterology | Admitting: Gastroenterology

## 2015-11-03 DIAGNOSIS — K921 Melena: Secondary | ICD-10-CM

## 2015-11-03 DIAGNOSIS — B1920 Unspecified viral hepatitis C without hepatic coma: Secondary | ICD-10-CM | POA: Diagnosis present

## 2015-11-04 ENCOUNTER — Telehealth: Payer: Self-pay | Admitting: *Deleted

## 2015-11-04 NOTE — Telephone Encounter (Signed)
  Oncology Nurse Navigator Documentation   Navigator Encounter Type: Telephone (11/04/15 1107) Patient Visit Type: Follow-up (11/04/15 1107) Treatment Phase: Final Radiation Tx (11/04/15 1107)     Called patient to check on her well being since completion of RT last week.  LVMM asking her to give me a call.  Gayleen Orem, RN, BSN, [place] at [place] [phone]                 Time Spent with Patient: 15 (11/04/15 1107)

## 2015-11-05 ENCOUNTER — Ambulatory Visit
Admission: RE | Admit: 2015-11-05 | Payer: No Typology Code available for payment source | Source: Ambulatory Visit | Admitting: Radiation Oncology

## 2015-11-05 ENCOUNTER — Encounter: Payer: Self-pay | Admitting: Adult Health

## 2015-11-05 ENCOUNTER — Inpatient Hospital Stay: Admission: RE | Admit: 2015-11-05 | Payer: Medicaid Other | Source: Ambulatory Visit | Admitting: Radiation Oncology

## 2015-11-05 ENCOUNTER — Encounter: Payer: Self-pay | Admitting: Radiation Oncology

## 2015-11-05 ENCOUNTER — Ambulatory Visit
Admission: RE | Admit: 2015-11-05 | Discharge: 2015-11-05 | Disposition: A | Discharge status: Home / Self Care | Payer: No Typology Code available for payment source | Source: Ambulatory Visit | Attending: Radiation Oncology | Admitting: Radiation Oncology

## 2015-11-05 VITALS — BP 109/67 | HR 78 | Temp 97.9°F | Resp 18 | Ht 67.0 in | Wt 127.9 lb

## 2015-11-05 DIAGNOSIS — R634 Abnormal weight loss: Secondary | ICD-10-CM

## 2015-11-05 DIAGNOSIS — C321 Malignant neoplasm of supraglottis: Secondary | ICD-10-CM

## 2015-11-05 MED ORDER — OXYCODONE HCL 5 MG PO TABS: 5.0000 mg | ORAL_TABLET | ORAL | Status: DC | PRN

## 2015-11-05 NOTE — Progress Notes (Signed)

## 2015-11-05 NOTE — Progress Notes (Signed)
  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 She has many symptoms, not all necessarily related to therapy, including flu-like episodes, that prompted her to refuse  coming in for appointments. This led to multiple breaks in treatment, despite our best efforts to encourage her to come in for supportive care; she was urged to continue treatment without long breaks that can  raise risk of tumor repopulation. She also had many social stressors for which she was referred to social work.  Eventually, she did complete treatment.  Plan: The patient has completed radiation treatment. The patient will return to radiation oncology clinic for routine followup in one month, sooner if needed. I advised them to call or return sooner if they have any questions or concerns related to their recovery or treatment.  -----------------------------------  Eppie Gibson, MD  This document serves as a record of services personally performed by Eppie Gibson, MD. It was created on her behalf by Darcus Austin, a trained medical scribe. The creation of this record is based on the scribe's personal observations and the provider's statements to them. This document has been checked and approved by the attending provider.

## 2015-11-05 NOTE — Progress Notes (Signed)
Pain issues, if any: yes reports a sore throat.  Also mentioned having tendonitis in her wrists.  She is currently taking Morphine 30 mg bid and oxycodone 10 mg 6 tablets a day.  Also taking lidocaine 3-5 times a day. Using a feeding tube?: no Weight changes, if any: no Swallowing issues, if any: pain with swallowing - eating softer foods.  Reports her appetite is better.  She is drinking 1 ensure per day. Smoking or chewing tobacco? Smoking 3-4 cigarettes per day. Using fluoride trays daily? N/a has dentures. Last ENT visit was on: has an appointment with Dr. Sherlene Shams next week. Other notable issues, if any: Reports having constipation and has not had a bm in 2 weeks.  She stopped taking Miralax.  Advised her to start taking it again and also gave her the constipation handout.  She has a fever blister on her lower lip.  Her skin on her neck is pink.  She is using biafine.  She reports fatigue.  BP 109/67 mmHg  Pulse 78  Temp(Src) 97.9 F (36.6 C) (Oral)  Resp 18  Ht 5\' 7"  (1.702 m)  Wt 127 lb 14.4 oz (58.015 kg)  BMI 20.03 kg/m2  SpO2 97%   Wt Readings from Last 3 Encounters:  11/05/15 127 lb 14.4 oz (58.015 kg)  10/27/15 127 lb 4 oz (57.72 kg)  10/25/15 129 lb 11.2 oz (58.832 kg)

## 2015-11-05 NOTE — Progress Notes (Signed)
I briefly saw [name] today with Dr. Isidore Moos for her routine follow-up appointment.  She will see me the Monday after Thanksgiving for continued post-treatment symptom management and supportive care.  Today, Dr. Isidore Moos has decreased her pain medication regimen, with the goal of weaning off of current pain regimen.  The final plan will be for [name] to resume her chronic pain management with her pain clinic team in [facility].  She knows to call me with any questions or concerns before her next appt here at the cancer center.   Mike Craze, NP [place] [phone]

## 2015-11-05 NOTE — Progress Notes (Signed)
Radiation Oncology         (336) [phone] ________________________________  Name: [name] MRN: [id number]  Date: 11/05/2015  DOB: Jun 02, 1966  Follow-Up Visit Note  CC: Smothers, Andree Elk, NP  Lorayne Bender, MD  Diagnosis and Prior Radiotherapy:       ICD-9-CM ICD-10-CM   1. 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 Pain issues, if any: Yes, reports a sore throat. Also mentioned having tendonitis in her wrists. She is currently taking Morphine 30 mg bid and oxycodone 10 mg 6 tablets a day. Also taking lidocaine 3-5 times a day.  Using a feeding tube?: no  Weight changes, if any: no  Wt Readings from Last 3 Encounters:  11/05/15 127 lb 14.4 oz (58.015 kg)  10/27/15 127 lb 4 oz (57.72 kg)  10/25/15 129 lb 11.2 oz (58.832 kg)  Swallowing issues, if any: Pain with swallowing - eating softer foods. Reports her appetite is better. She is drinking 1 ensure per day.  Smoking or chewing tobacco? Smoking 3-4 cigarettes per day.  Using fluoride trays daily? N/A. Has dentures.  Last ENT visit was on: has an appointment with Dr. Sherlene Shams next week.  Other notable issues, if any: Reports having constipation and has not had a bowel movement in 2 weeks. She stopped taking Miralax. The nurse advised her to start taking it again and also gave her the constipation handout. The nurse noticed a fever blister on her lower lip and the skin on her neck is pink. She is using biafine and reports fatigue.  ALLERGIES:  is allergic to ancef;  ciprofloxacin; penicillin g; and wasp venom.  Meds: Current Outpatient Prescriptions  Medication Sig Dispense Refill  . ascorbic acid (VITAMIN C) 500 MG tablet Take 500 mg by mouth daily.    Marland Kitchen b complex vitamins tablet Take 1 tablet by mouth every other day.     . diclofenac sodium (VOLTAREN) 1 % GEL Apply 2 g topically as needed (for neck pain).     . DULoxetine (CYMBALTA) 30 MG capsule TAKES 60MG  BY MOUTH ONCE DAILY    . emollient (BIAFINE) cream Apply 90 g topically 4 (four) times daily as needed (FOR RADIATION).     Marland Kitchen lidocaine (XYLOCAINE) 2 % solution Patient: Mix 1part 2% viscous lidocaine, 1part H20. Swish and/or swallow 87mL of this mixture, 77min before meals and at bedtime, up to QID 100 mL 5  . morphine (MS CONTIN) 30 MG 12 hr tablet Take 1 tablet (30 mg total) by mouth every 12 (twelve) hours. 60 tablet 0  . Nutritional Supplements (ENSURE COMPLETE SHAKE) LIQD Take 1 Bottle by mouth 2 (two) times daily. 60 Bottle 12  . nystatin (MYCOSTATIN) 100000 UNIT/ML suspension TK 5 ML PO QID FOR 14 DAYS  0  . orphenadrine (NORFLEX) 100 MG tablet   1  . Oxycodone HCl 10 MG TABS Take 1 tablet (10 mg total) by mouth every 3 (three) hours as needed. 60 tablet 0  . pregabalin (LYRICA) 50 MG capsule Take 50 mg by mouth 3 (three) times daily.     Marland Kitchen  sucralfate (CARAFATE) 1 G tablet Dissolve 1 tablet in 10 mL H20 and swallow up to QID 90 tablet 5  . diazepam (VALIUM) 5 MG tablet TAKE 1 TABLET BY MOUTH EVERY 8 HOURS AS NEEDED FOR ANXIETY (Patient not taking: Reported on 11/05/2015) 90 tablet 0  . ibuprofen (ADVIL,MOTRIN) 600 MG tablet Take 1,800 mg by mouth at bedtime.     Marland Kitchen omeprazole (PRILOSEC) 40 MG capsule Take 1 capsule (40 mg total) by mouth daily. (Patient not taking: Reported on 11/05/2015) 90 capsule 3  . ondansetron (ZOFRAN) 8 MG tablet Take 1 tablet (8 mg total) by mouth every 8 (eight) hours as needed for nausea or vomiting. (Patient not taking: Reported on 11/05/2015) 30 tablet 1  .  polyethylene glycol (MIRALAX) packet Take 17 g by mouth daily. (Patient not taking: Reported on 11/05/2015) 14 each 6   No current facility-administered medications for this encounter.    Physical Findings: The patient is in no acute distress. Patient is alert and oriented. Wt Readings from Last 3 Encounters:  11/05/15 127 lb 14.4 oz (58.015 kg)  10/27/15 127 lb 4 oz (57.72 kg)  10/25/15 129 lb 11.2 oz (58.832 kg)    height is 5\' 7"  (1.702 m) and weight is 127 lb 14.4 oz (58.015 kg). Her oral temperature is 97.9 F (36.6 C). Her blood pressure is 109/67 and her pulse is 78. Her respiration is 18 and oxygen saturation is 97%. .  No mucositis in the oral cavity or oropharynx. Mucous membranes are moist. Skin over her neck has healed well. No palpable masses in the cervical or supraclavicular regions.  Lab Findings: Lab Results  Component Value Date   WBC 3.9* 10/27/2015   HGB 13.7 10/27/2015   HCT 40.6 10/27/2015   MCV 91.1 10/27/2015   PLT 218.0 10/27/2015    Lab Results  Component Value Date   TSH 0.666 08/13/2015    Radiographic Findings: US Abdomen Complete  11/03/2015  CLINICAL DATA:  Hepatitis C, evaluate for cirrhosis EXAM: ULTRASOUND ABDOMEN COMPLETE COMPARISON:  CT abdomen pelvis dated 09/15/2013 FINDINGS: Gallbladder: Layering gallbladder sludge. No gallbladder wall thickening or pericholecystic fluid. Negative sonographic Murphy's sign. Common bile duct: Diameter: 6 mm Liver: No focal lesion identified. Within the upper limits of normal for parenchymal echogenicity. IVC: No abnormality visualized. Pancreas: Visualized portion unremarkable. Spleen: Size and appearance within normal limits. Right Kidney: Length: 9.9 cm.  No mass or hydronephrosis. Left Kidney: Length: 9.9 cm.  No mass or hydronephrosis. Abdominal aorta: No aneurysm visualized. Other findings: None. IMPRESSION: Layering gallbladder sludge. No associated sonographic findings to suggest acute cholecystitis. No  focal hepatic lesion is seen. Electronically Signed   By: Julian Hy M.D.   On: 11/03/2015 08:52    Impression/Plan:    1) Head and Neck Cancer Status: Healing from radiotherapy.  2) Nutritional Status:Eating softer foods. Reports her appetite is better. She is drinking 1 ensure per day. PEG tube: No  3) Risk Factors: The patient has been educated about risk factors including alcohol and tobacco abuse; she understands that avoidance of alcohol and tobacco is important to prevent recurrences as well as other cancers.  She is still smoking at this time.  4) Swallowing: Pain while swallowing. Eating softer foods to cope with this. Lidocaine, Carafate  5) Dental: Encouraged to continue regular followup with dentistry, and dental hygiene including fluoride rinses.  6) Thyroid function:  Lab Results  Component Value Date   TSH 0.666 08/13/2015    7) Other: The  patient was given a "Life After Cancer for Every Survivor" booklet and our Survivorship Navigator, Mike Craze, NP, gave the patient her contact information.   I will CC my note to Maryan Rued, MD (Otolaryngology) in [place], [place].   I have filled Oxycodone 5 mg every 3 hours dispense 90. For the patient's "fever blister" I advised her to use Abreva. For the constipation, she should use Miralax and use an enema and/or Magnesium Citrate. If she doesn't have a bowel movement this weekend she should call Mike Craze, NP on Monday.  EVENTUALLY WE WILL RELEASE HER BACK TO HER PAIN CLINIC AFTER SIDE EFFECTS OF RT dissipate.  8) The patient will see Mike Craze, NP on Monday,11/15/15. Follow-up in 3 months with re-staging scans. The patient was encouraged to call with any issues or questions before then.  _____________________________________   Eppie Gibson, MD

## 2015-11-08 ENCOUNTER — Telehealth: Payer: Self-pay | Admitting: *Deleted

## 2015-11-08 NOTE — Telephone Encounter (Signed)
CALLED PATIENT TO INFORM OF CT AND FU, LVM FOR A RETURN CALL 

## 2015-11-15 ENCOUNTER — Telehealth: Payer: Self-pay | Admitting: Adult Health

## 2015-11-15 ENCOUNTER — Ambulatory Visit: Payer: Medicaid Other | Admitting: Adult Health

## 2015-11-15 NOTE — Telephone Encounter (Signed)
Ms. Vanacker called me at about 11:20 this morning stating that she was running late her for scheduled 11 am visit with me today.  I let her know that if she was able to get to the clinic before 1 pm, that I would still be able to see her today.    She then called me again at 12:10 pm to let me know that she could not find her car keys to be able to get here and had no other means of transportation.  She requested that we reschedule her appointment to tomorrow morning.  She states that she was able to have a bowel movement over the weekend after taking Magnesium Citrate OTC.  She states that her pain is well-controlled, and that her neck and back are the biggest sources of her pain at this time.    I have rescheduled her follow-up visit with me to tomorrow at 10 am.  I gave her my direct office number and encouraged her to call me if she needed anything or needed to change her appointment again.  She verbalized understanding and agreed with this plan.  I will see her tomorrow, 11/16/15.   Mike Craze, NP [place] [phone]

## 2015-11-16 ENCOUNTER — Telehealth: Payer: Self-pay | Admitting: Adult Health

## 2015-11-16 ENCOUNTER — Ambulatory Visit: Payer: Medicaid Other | Admitting: Adult Health

## 2015-11-16 NOTE — Telephone Encounter (Signed)
[name] called to cancel her follow-up appt with me today. She stated, "If this appointment was about getting me off the pain medication, I'm already decreasing that myself."  She tells me that she is only taking the MS Contin once daily (prescribed for Q12H) and is taking the oxycodone 5-6 times per day (prescribed for Q3Hprn).    I reminded her that the MS Contin is her long-acting medication and she not be taken as needed.  She could continue to take it only once daily, but should do so about the same time every day.  She voiced understanding and agreed with this plan.  The long-term plan is to get her back in to see her pain specialist in [place] for her chronic orthopedic pain concerns.    I also encouraged her to continue her efforts to stop smoking. She states that she is still trying to cut down and eventually quit.   She tells me that she is overall doing well and does not think she needs to see me anymore before her re-staging scans and visit with Dr. Isidore Moos in 01/2016.  I reviewed those scheduled appointments with her and she stated that she has written them in her calendar.  I offered to mail her an appointment calendar, but she declined at this time.  She voiced understanding of her upcoming appointments and instructions for her scans.    I encouraged her to call me at any time, should any concerns or needs arise before her next visit here at the cancer center.  She agreed and voiced appreciation.    Mike Craze, NP [place] [phone]

## 2015-11-18 ENCOUNTER — Telehealth: Payer: Self-pay | Admitting: Gastroenterology

## 2015-11-18 NOTE — Telephone Encounter (Signed)
Pt states her oncologist told her to try Mag Citrate for constipation and Miralax. States she has not had a normal BM in a week. When she took the mag citrate only liquid stool came out. Pt wants to know what else she could take for constipation. Pt also wants to know if she is due for colonoscopy. Please advise.

## 2015-11-19 NOTE — Telephone Encounter (Signed)
Spoke with pt and she is aware. Scheduled pt to see Dr. Ardis Hughs 01/26/16@10 :30am.

## 2015-11-19 NOTE — Telephone Encounter (Signed)
Patient called back in stating that she forgot to tell Dr. Ardis Hughs that her mother had the same problem with bm and constipation and that her mother had to have surgery to remove part of her intestine.

## 2015-11-19 NOTE — Telephone Encounter (Signed)
Cutting back further on her narcotic pain meds will help her constipation.    She should also start taking citrucel (orange flavored) powder fiber supplement.  This may cause some bloating at first but that usually goes away. Begin with a small spoonful and work your way up to a large, heaping spoonful daily over a week.  She was supposed to have rov with me in about 2 months (can you check on that), was planning to discuss colon cancer screening at that time.  Thanks

## 2015-11-23 ENCOUNTER — Other Ambulatory Visit: Payer: Self-pay | Admitting: Adult Health

## 2015-11-23 ENCOUNTER — Telehealth: Payer: Self-pay | Admitting: Adult Health

## 2015-11-23 DIAGNOSIS — F419 Anxiety disorder, unspecified: Secondary | ICD-10-CM

## 2015-11-23 DIAGNOSIS — G8929 Other chronic pain: Secondary | ICD-10-CM

## 2015-11-23 DIAGNOSIS — C321 Malignant neoplasm of supraglottis: Secondary | ICD-10-CM

## 2015-11-23 DIAGNOSIS — R070 Pain in throat: Secondary | ICD-10-CM | POA: Insufficient documentation

## 2015-11-23 DIAGNOSIS — C329 Malignant neoplasm of larynx, unspecified: Secondary | ICD-10-CM

## 2015-11-23 MED ORDER — DULOXETINE HCL 30 MG PO CPEP: 60.0000 mg | ORAL_CAPSULE | Freq: Every day | ORAL | Status: AC

## 2015-11-23 MED ORDER — OXYCODONE HCL 5 MG PO TABS: 5.0000 mg | ORAL_TABLET | Freq: Four times a day (QID) | ORAL | Status: DC | PRN

## 2015-11-23 NOTE — Telephone Encounter (Signed)
I received a call from [name] stating that she needed refills on some of her prescriptions. She stated that she needed more MS Contin, oxycodone, and Cymbalta.  I asked her who had been prescribing the Cymbalta for her (as I have not previously refilled this medication for her) and she stated that Dr. Isidore Moos had been giving her all of her pain medications (both for acute and chronic pain management).   She states that she is "only taking the MS Contin once per day and the oxycodone about 3x/day. I also need a refill of the Cymbalta to take with the Lyrica because it helps my chronic joint/arthritis pain."   However, upon reviewing the patient's office visit notes, she has recently received a prescription from [place].  There is a [facility] note indicating that the patient called there for a refill of pain medications as well.   Given the patient's request for multiple requests for pain medications from different locations, I reviewed the patient's controlled substance dispensing history in the [place] Controlled Substance Reporting System.  Below are the most recent results of this report:   -Oxycodone 5 mg tabs (#90), 11 day supply, filled on 11/08/15. Prescribed by [facility].  -Morphine Sulfate ER 30 mg tabs (#60), 30 day supply, filled on 11/01/15. Prescribed by [facility].  -Lyrica 50 mg caps (#90), 30 day supply, filled on 10/31/15. Prescribed by Sylvie Farrier, NP (Pain Specialist in [place]).     Therefore, I called the patient to let her know that it was too soon to refill her MS Contin, but I did refill the oxycodone 5 mg Q6hprn #90.  I will leave this prescription in a sealed envelope for [name] to pick up at our cancer center front desk.  I have also refilled the Cymbalta 60 mg daily, with 3 refills to her [employer] on [facility].  I confirmed this dosage from previous pain management notes from Sylvie Farrier,  NP.    From a cancer standpoint, [name] should be progressing towards healing and her need for narcotics for oncologic-related pain should be minimal at this point.  We will support her pain management through her upcoming restaging PET scan, with continued efforts to decrease her use of opiates, before releasing her back to the care of her pain specialists in [facility] for continued chronic pain management.   I left [name] a voicemail and encouraged her to call me with any questions.    Mike Craze, NP [place] [phone]

## 2015-11-23 NOTE — Progress Notes (Signed)
  I received a call from Ms. [name] stating that she needed refills on some of her prescriptions. She stated that she needed more MS Contin, oxycodone, and Cymbalta.  I asked her who had been prescribing the Cymbalta for her (as I have not previously refilled this medication for her) and she stated that Dr. Isidore Moos had been giving her all of her pain medications (both for acute and chronic pain management).   She states that she is "only taking the MS Contin once per day and the oxycodone about 3x/day. I also need a refill of the Cymbalta to take with the Lyrica because it helps my chronic joint/arthritis pain."   However, upon reviewing the patient's office visit notes, she has recently received a prescription from [place].  There is a [facility] note indicating that the patient called there for a refill of pain medications as well.   Given the patient's request for multiple requests for pain medications from different locations, I reviewed the patient's controlled substance dispensing history in the [place] Controlled Substance Reporting System.  Below are the most recent results of this report:   -Oxycodone 5 mg tabs (#90), 11 day supply, filled on 11/08/15. Prescribed by [facility].  -Morphine Sulfate ER 30 mg tabs (#60), 30 day supply, filled on 11/01/15. Prescribed by [facility].  -Lyrica 50 mg caps (#90), 30 day supply, filled on 10/31/15. Prescribed by Sylvie Farrier, NP (Pain Specialist in [place]).     Therefore, I called the patient to let her know that it was too soon to refill her MS Contin, but I did refill the oxycodone 5 mg Q6hprn #90.  I will leave this prescription in a sealed envelope for Ms. [name] to pick up at our cancer center front desk.  I have also refilled the Cymbalta 60 mg daily, with 3 refills to her [employer] on [facility].  I confirmed this dosage from previous pain management notes from Sylvie Farrier,  NP.    From a cancer standpoint, Ms. [name] should be progressing towards healing and her need for narcotics for oncologic-related pain should be minimal at this point.  We will support her pain management through her upcoming restaging PET scan, with continued efforts to decrease her use of opiates, before releasing her back to the care of her pain specialists in [facility] for continued chronic pain management.   I encouraged Ms. [name] to call me with any questions.    Mike Craze, NP [place] [phone]

## 2015-11-24 ENCOUNTER — Other Ambulatory Visit: Payer: Medicaid Other

## 2015-12-02 ENCOUNTER — Other Ambulatory Visit: Payer: Self-pay | Admitting: Adult Health

## 2015-12-02 ENCOUNTER — Telehealth: Payer: Self-pay | Admitting: Adult Health

## 2015-12-02 DIAGNOSIS — C321 Malignant neoplasm of supraglottis: Secondary | ICD-10-CM

## 2015-12-02 DIAGNOSIS — G8929 Other chronic pain: Secondary | ICD-10-CM

## 2015-12-02 MED ORDER — MORPHINE SULFATE ER 30 MG PO TBCR: 30.0000 mg | EXTENDED_RELEASE_TABLET | Freq: Two times a day (BID) | ORAL | Status: DC

## 2015-12-02 NOTE — Telephone Encounter (Signed)
[name] called me requesting a refill of her MS Contin 30 mg.  She states that her chronic joint and muscle pain has caused her to "go back up to taking the morphine twice a day again."  She had previously stated that she was only taking the MS Contin once daily (despite it being prescribed Q12H).  She tells me that she is scheduled to see Sylvie Farrier, NP (her pain specialist) on 12/17/15.   Therefore, after again reviewing the dispensing data for this patient in the [place] Controlled Substance Registry, I agreed to refill the MS Contin 30 mg Q12H (#30), 2 weeks worth, so that she has enough medication until her follow-up visit with her pain specialist.  However, this will be my last prescription for pain management for the patient.  The majority of her pain at this point is her chronic joint/muscle pain and her pain related to her malignancy is largely resolved.  I made [name] aware that this would be my last time prescribing pain medication for her and she should direct her future pain management concerns to her pain specialist.  She voiced verbal understanding and appreciation.   I will leave the prescription for [name] to pick up at our cancer center front desk in an envelope.  [name] states she will come by and pick it up today.  I encouraged her to call me if she has any additional questions or concerns related to her cancer.  She will see Dr. Isidore Moos in February 2017 after her restaging scans and is aware of these appointments.  Mike Craze, NP [place] [phone]

## 2015-12-03 ENCOUNTER — Ambulatory Visit: Payer: No Typology Code available for payment source | Admitting: Radiation Oncology

## 2015-12-16 ENCOUNTER — Telehealth: Payer: Self-pay | Admitting: *Deleted

## 2015-12-16 NOTE — Telephone Encounter (Signed)
[name] called today with c/o of increasing sore throat.  After conferring with Mike Craze, [name] was advised to keep her appointment tomorrow at the Pain Clinic and have them examine her throat.  She had a recent Erythromycin script for this same sore throat.

## 2015-12-23 ENCOUNTER — Other Ambulatory Visit: Payer: Medicaid Other

## 2015-12-23 ENCOUNTER — Other Ambulatory Visit (HOSPITAL_COMMUNITY)
Admission: RE | Admit: 2015-12-23 | Discharge: 2015-12-23 | Disposition: A | Discharge status: Home / Self Care | Payer: Medicaid Other | Source: Ambulatory Visit | Attending: Infectious Diseases | Admitting: Infectious Diseases

## 2015-12-23 DIAGNOSIS — Z113 Encounter for screening for infections with a predominantly sexual mode of transmission: Secondary | ICD-10-CM | POA: Insufficient documentation

## 2015-12-23 DIAGNOSIS — B20 Human immunodeficiency virus [HIV] disease: Secondary | ICD-10-CM

## 2015-12-23 LAB — CBC WITH DIFFERENTIAL/PLATELET
Basophils Absolute: 0 10*3/uL (ref 0.0–0.1)
Basophils Relative: 1 % (ref 0–1)
Eosinophils Absolute: 0.1 10*3/uL (ref 0.0–0.7)
Eosinophils Relative: 2 % (ref 0–5)
HEMATOCRIT: 34.2 % — AB (ref 36.0–46.0)
HEMOGLOBIN: 12.5 g/dL (ref 12.0–15.0)
LYMPHS PCT: 17 % (ref 12–46)
Lymphs Abs: 0.5 10*3/uL — ABNORMAL LOW (ref 0.7–4.0)
MCH: 32.9 pg (ref 26.0–34.0)
MCHC: 36.5 g/dL — AB (ref 30.0–36.0)
MCV: 90 fL (ref 78.0–100.0)
MONO ABS: 0.4 10*3/uL (ref 0.1–1.0)
MONOS PCT: 13 % — AB (ref 3–12)
MPV: 9.1 fL (ref 8.6–12.4)
NEUTROS ABS: 2.1 10*3/uL (ref 1.7–7.7)
NEUTROS PCT: 67 % (ref 43–77)
Platelets: 215 10*3/uL (ref 150–400)
RBC: 3.8 MIL/uL — AB (ref 3.87–5.11)
RDW: 14.2 % (ref 11.5–15.5)
WBC: 3.2 10*3/uL — ABNORMAL LOW (ref 4.0–10.5)

## 2015-12-23 LAB — COMPLETE METABOLIC PANEL WITH GFR
ALBUMIN: 3.6 g/dL (ref 3.6–5.1)
ALK PHOS: 83 U/L (ref 33–130)
ALT: 13 U/L (ref 6–29)
AST: 15 U/L (ref 10–35)
BILIRUBIN TOTAL: 0.3 mg/dL (ref 0.2–1.2)
BUN: 5 mg/dL — AB (ref 7–25)
CALCIUM: 9.3 mg/dL (ref 8.6–10.4)
CO2: 28 mmol/L (ref 20–31)
CREATININE: 0.57 mg/dL (ref 0.50–1.05)
Chloride: 105 mmol/L (ref 98–110)
GFR, Est African American: >89 mL/min (ref >=60)
GFR, Est Non African American: >89 mL/min (ref >=60)
GLUCOSE: 83 mg/dL (ref 65–99)
POTASSIUM: 4.2 mmol/L (ref 3.5–5.3)
Sodium: 138 mmol/L (ref 135–146)
TOTAL PROTEIN: 6 g/dL — AB (ref 6.1–8.1)

## 2015-12-23 LAB — LIPID PANEL
CHOL/HDL RATIO: 3.9 ratio (ref <=5.0)
CHOLESTEROL: 164 mg/dL (ref 125–200)
HDL: 42 mg/dL — AB (ref >=46)
LDL CALC: 104 mg/dL (ref <130)
TRIGLYCERIDES: 90 mg/dL (ref <150)
VLDL: 18 mg/dL (ref <30)

## 2015-12-24 ENCOUNTER — Telehealth: Payer: Self-pay | Admitting: Adult Health

## 2015-12-24 LAB — HEPATITIS B SURFACE ANTIGEN: Hepatitis B Surface Ag: NEGATIVE

## 2015-12-24 LAB — HEPATITIS C ANTIBODY: HCV Ab: REACTIVE — AB

## 2015-12-24 LAB — URINE CYTOLOGY ANCILLARY ONLY
CHLAMYDIA, DNA PROBE: NEGATIVE
Neisseria Gonorrhea: NEGATIVE

## 2015-12-24 LAB — URINALYSIS
Bilirubin Urine: NEGATIVE
Glucose, UA: NEGATIVE
Hgb urine dipstick: NEGATIVE
KETONES UR: NEGATIVE
LEUKOCYTES UA: NEGATIVE
NITRITE: NEGATIVE
PH: 7.5 (ref 5.0–8.0)
Protein, ur: NEGATIVE
SPECIFIC GRAVITY, URINE: 1.015 (ref 1.001–1.035)

## 2015-12-24 LAB — T-HELPER CELL (CD4) - (RCID CLINIC ONLY)
CD4 % Helper T Cell: 28 % — ABNORMAL LOW (ref 33–55)
CD4 T Cell Abs: 140 /uL — ABNORMAL LOW (ref 400–2700)

## 2015-12-24 LAB — HEPATITIS B CORE ANTIBODY, TOTAL: HEP B C TOTAL AB: NONREACTIVE

## 2015-12-24 LAB — HIV-1 RNA ULTRAQUANT REFLEX TO GENTYP+

## 2015-12-24 LAB — HEPATITIS B SURFACE ANTIBODY,QUALITATIVE: Hep B S Ab: NEGATIVE

## 2015-12-24 LAB — HEPATITIS A ANTIBODY, TOTAL: Hep A Total Ab: NONREACTIVE

## 2015-12-24 LAB — RPR

## 2015-12-24 NOTE — Telephone Encounter (Signed)
Received call from Natalie Prader, NP with the patient's pain management team in [facility].  Ms. [name] called to inquire about the controlled substance prescriptions provided to the patient during and acutely after her cancer treatments.  I relayed the patient's treatment course to [name] and let her know that the patient's oncologic pain is largely resolved.  I communicated with the patient back in 11/2015 that I would no longer be prescribing any additional pain medication for her, as her oncologic pain has largely resolved, and encouraged her to return to her pain team for continued management.  Ms. [name] voiced understanding and stated that the patient had also told her that her oncology team would no longer be managing her pain.  I encouraged [name] and their team to contact us again at any time for any additional questions and voiced appreciation for her call.   Mike Craze, NP [place] [phone]

## 2015-12-25 LAB — QUANTIFERON TB GOLD ASSAY (BLOOD)
Interferon Gamma Release Assay: NEGATIVE
Mitogen value: 5.2 IU/mL
QUANTIFERON TB AG MINUS NIL: -0.01 [IU]/mL
Quantiferon Nil Value: 0.04 IU/mL
TB Ag value: 0.03 IU/mL

## 2015-12-28 ENCOUNTER — Telehealth: Payer: Self-pay | Admitting: Adult Health

## 2015-12-28 LAB — HEPATITIS C RNA QUANTITATIVE: HCV QUANT: NOT DETECTED [IU]/mL (ref <15)

## 2015-12-28 NOTE — Telephone Encounter (Signed)
I called patient to discuss with her our concerns regarding her upcoming cancelled scans and appts at the cancer center.   Patient answered the phone and stated that she "was in the middle of something and would need to call me back."  I did not get to speak to her about any concerns.  I voiced understanding and asked that she return my call.  I asked about her well-being and she said she was fine.  I will await return call and if I do not hear from her in the next day or 2, then I will give her another call.   Mike Craze, NP [place] [phone]

## 2015-12-29 LAB — HLA B*5701: HLA-B*5701 w/rflx HLA-B High: NEGATIVE

## 2016-01-04 ENCOUNTER — Telehealth: Payer: Self-pay

## 2016-01-04 DIAGNOSIS — K7460 Unspecified cirrhosis of liver: Secondary | ICD-10-CM

## 2016-01-04 NOTE — Telephone Encounter (Signed)
Pt has ROV and will have labs prior

## 2016-01-04 NOTE — Telephone Encounter (Signed)
-----   Message from Barron Alvine, [place] sent at 11/04/2015 12:58 PM EST ----- rov with me in 2-3 months, cbc/cmet a few days prior  appt and labs are not in EPIC make sure pt has called and scheduled

## 2016-01-10 ENCOUNTER — Telehealth: Payer: Self-pay | Admitting: *Deleted

## 2016-01-10 ENCOUNTER — Ambulatory Visit: Payer: Medicaid Other | Admitting: Infectious Diseases

## 2016-01-12 NOTE — Telephone Encounter (Signed)
  Oncology Nurse Navigator Documentation Navigator Location: [facility] (01/10/16 1108) Navigator Encounter Type: Telephone (01/10/16 1108) Telephone: Outgoing Call (01/10/16 1108)         Patient Visit Type: Follow-up (01/10/16 1108)     In support of MD [place] survivorship care of Ms Pressler, I called Ms Pezzulo to check on her well being and to facilitate rescheduling of her recent cancellation of her 2/16 CT scans and 2/17 follow-up with Dr. Isidore Moos.  She indicated she is experiencing neck swelling with soreness, occasional fevers.  I encouraged her to see Elzie Rings for evaluation but she declined stating she will address he concerns during an upcoming appt with her PCP (she did not provide a date).  I offered to assist her with rescheduling of her CTs and follow-up appt with Dr. Isidore Moos.  She declined stating she has a CT scheduled.  I noted that I did not see an appt, emphasized the importance of having the re-staging CTs and follow-up with Dr. Isidore Moos.  She indicated she "had to go" and would call me back.  I did not receive a call by day's end. I routed this information to Dr Isidore Moos and Elzie Rings.  Gayleen Orem, RN, BSN, [place] at [place] [phone]                                Time Spent with Patient: 15 (01/10/16 1108)

## 2016-01-26 ENCOUNTER — Ambulatory Visit: Payer: Medicaid Other | Admitting: Gastroenterology

## 2016-01-26 ENCOUNTER — Encounter: Payer: Self-pay | Admitting: Gastroenterology

## 2016-01-27 ENCOUNTER — Telehealth: Payer: Self-pay | Admitting: *Deleted

## 2016-01-27 NOTE — Telephone Encounter (Signed)
  Oncology Nurse Navigator Documentation  Navigator Location: [facility] (01/27/16 [id number]) Navigator Encounter Type: Telephone (01/27/16 0925) Telephone: Outgoing Call (01/27/16 [id number])         Patient Visit Type: Follow-up (01/27/16 [id number])       Interventions: Coordination of Care (01/27/16 0925)     Called [name] to check on her well-being and to arrange f/u appt with Dr Isidore Moos and re-staging CT.  LVMM , asked her to give me a call.  Did not hear back from her by day's end.  Gayleen Orem, RN, BSN, [place] at [place] [phone]                     Time Spent with Patient: 15 (01/27/16 0925)

## 2016-01-28 ENCOUNTER — Emergency Department (HOSPITAL_COMMUNITY)
Admission: EM | Admit: 2016-01-28 | Discharge: 2016-01-29 | Disposition: A | Discharge status: Home / Self Care | Payer: Medicaid Other | Attending: Emergency Medicine | Admitting: Emergency Medicine

## 2016-01-28 ENCOUNTER — Encounter (HOSPITAL_COMMUNITY): Payer: Self-pay | Admitting: *Deleted

## 2016-01-28 DIAGNOSIS — M79651 Pain in right thigh: Secondary | ICD-10-CM | POA: Diagnosis present

## 2016-01-28 DIAGNOSIS — F419 Anxiety disorder, unspecified: Secondary | ICD-10-CM | POA: Diagnosis not present

## 2016-01-28 DIAGNOSIS — Z85828 Personal history of other malignant neoplasm of skin: Secondary | ICD-10-CM | POA: Insufficient documentation

## 2016-01-28 DIAGNOSIS — B20 Human immunodeficiency virus [HIV] disease: Secondary | ICD-10-CM | POA: Insufficient documentation

## 2016-01-28 DIAGNOSIS — F319 Bipolar disorder, unspecified: Secondary | ICD-10-CM | POA: Diagnosis not present

## 2016-01-28 DIAGNOSIS — G8929 Other chronic pain: Secondary | ICD-10-CM | POA: Insufficient documentation

## 2016-01-28 DIAGNOSIS — Z862 Personal history of diseases of the blood and blood-forming organs and certain disorders involving the immune mechanism: Secondary | ICD-10-CM | POA: Diagnosis not present

## 2016-01-28 DIAGNOSIS — Z79899 Other long term (current) drug therapy: Secondary | ICD-10-CM | POA: Insufficient documentation

## 2016-01-28 DIAGNOSIS — F17210 Nicotine dependence, cigarettes, uncomplicated: Secondary | ICD-10-CM | POA: Diagnosis not present

## 2016-01-28 DIAGNOSIS — M25551 Pain in right hip: Secondary | ICD-10-CM | POA: Diagnosis not present

## 2016-01-28 MED ORDER — OXYCODONE-ACETAMINOPHEN 5-325 MG PO TABS
1.0000 | ORAL_TABLET | Freq: Once | ORAL | Status: AC
  Administered 2016-01-28: 1 via ORAL
  Filled 2016-01-28: qty 1

## 2016-01-28 MED ORDER — ONDANSETRON 4 MG PO TBDP
8.0000 mg | ORAL_TABLET | Freq: Once | ORAL | Status: AC
  Administered 2016-01-28: 8 mg via ORAL
  Filled 2016-01-28: qty 2

## 2016-01-28 NOTE — ED Notes (Signed)
The pt is c/o numbness tingling leg pain  Since her last chemo therapy 3 months ago  She has also had radfiation  She has a porta-cath  She had a pet scan yesterday and was called today an told to come top the  Nearest ed.

## 2016-01-28 NOTE — ED Notes (Signed)
The pt gets her care at [name]

## 2016-01-29 ENCOUNTER — Ambulatory Visit (HOSPITAL_COMMUNITY)
Admission: RE | Admit: 2016-01-29 | Discharge: 2016-01-29 | Disposition: A | Discharge status: Home / Self Care | Payer: Medicaid Other | Source: Ambulatory Visit | Attending: Emergency Medicine | Admitting: Emergency Medicine

## 2016-01-29 ENCOUNTER — Emergency Department (HOSPITAL_COMMUNITY): Payer: Medicaid Other

## 2016-01-29 DIAGNOSIS — M79651 Pain in right thigh: Secondary | ICD-10-CM | POA: Insufficient documentation

## 2016-01-29 DIAGNOSIS — M79609 Pain in unspecified limb: Secondary | ICD-10-CM

## 2016-01-29 DIAGNOSIS — M79604 Pain in right leg: Secondary | ICD-10-CM | POA: Insufficient documentation

## 2016-01-29 MED ORDER — MORPHINE SULFATE (PF) 4 MG/ML IV SOLN
4.0000 mg | Freq: Once | INTRAVENOUS | Status: AC
  Administered 2016-01-29: 4 mg via INTRAMUSCULAR
  Filled 2016-01-29: qty 1

## 2016-01-29 MED ORDER — ENOXAPARIN SODIUM 60 MG/0.6ML ~~LOC~~ SOLN
58.0000 mg | Freq: Once | SUBCUTANEOUS | Status: AC
  Administered 2016-01-29: 60 mg via SUBCUTANEOUS
  Filled 2016-01-29: qty 0.6

## 2016-01-29 NOTE — Discharge Instructions (Signed)

## 2016-01-29 NOTE — ED Notes (Signed)
Pt verbalized understanding of d/c instructions and has no further questions. Pt stable and NAD.  

## 2016-01-29 NOTE — ED Notes (Signed)
PA at bedside at this time.  

## 2016-01-29 NOTE — Progress Notes (Signed)
VASCULAR LAB PRELIMINARY  PRELIMINARY  PRELIMINARY  PRELIMINARY  Right lower extremity venous duplex completed.    Preliminary report:  There is no DVT or SVT noted in the right lower extremity.   Lynee Rosenbach, RVT 01/29/2016, 8:38 AM

## 2016-01-29 NOTE — ED Provider Notes (Signed)
By signing my name below, I, Emmanuella Mensah, attest that this documentation has been prepared under the direction and in the presence of [place], DO. Electronically Signed: Judithann Sauger, ED Scribe. 01/29/2016. 1:15 AM.   TIME SEEN: 1:08 AM   CHIEF COMPLAINT: Right   HPI: [name] is a 50 y.o. female with a hx of HIV, HCV, and cancer of squamous cell of larynx who presents to the Emergency Department complaining of gradually worsening chronic constant moderate pain to the right lateral aspect of right upper leg onset 3 months ago. No alleviating factors noted. Pt has not taken any medications PTA. She states that she has not spoken to her PCP about these symptoms. She denies any recent falls, injuries, or trauma. She denies any hx of PE/DVT. Her last chemotherapy was approx. 6 months ago. Pt had a PET scan 2 days ago. Reports that she called her doctor tonight for recommendations for pain management and they sent her to the emergency department. No numbness, tingling, or weakness to extremities.   ROS: See HPI Constitutional: no fever  Eyes: no drainage  ENT: no runny nose   Cardiovascular:  no chest pain  Resp: no SOB  GI: no vomiting GU: no dysuria Integumentary: no rash  Allergy: no hives  Musculoskeletal: no leg swelling  Neurological: no slurred speech ROS otherwise negative  PAST MEDICAL HISTORY/PAST SURGICAL HISTORY:  Past Medical History  Diagnosis Date  . HIV (human immunodeficiency virus infection) ([place])   . HIV (human immunodeficiency virus infection) ([place])   . HCV (hepatitis C virus)     chronic   . Bipolar 1 disorder ([place])   . Cancer ([place]) 12/31/2014    squamous cell of larynx  . Alcohol abuse   . Anemia   . Anxiety   . Depression     MEDICATIONS:  Prior to Admission medications   Medication Sig Start Date End Date Taking? Authorizing Provider  ascorbic acid (VITAMIN C) 500 MG tablet Take 500 mg by mouth daily.    Historical Provider, MD   b complex vitamins tablet Take 1 tablet by mouth every other day.     Historical Provider, MD  diazepam (VALIUM) 5 MG tablet TAKE 1 TABLET BY MOUTH EVERY 8 HOURS AS NEEDED FOR ANXIETY Patient not taking: Reported on 11/05/2015 08/27/15   Volanda Napoleon, MD  diclofenac sodium (VOLTAREN) 1 % GEL Apply 2 g topically as needed (for neck pain).  06/03/15   Historical Provider, MD  DULoxetine (CYMBALTA) 30 MG capsule Take 2 capsules (60 mg total) by mouth daily. 11/23/15 11/22/16  Holley Bouche, NP  emollient (BIAFINE) cream Apply 90 g topically 4 (four) times daily as needed (FOR RADIATION).     Historical Provider, MD  ibuprofen (ADVIL,MOTRIN) 600 MG tablet Take 1,800 mg by mouth at bedtime.     Historical Provider, MD  lidocaine (XYLOCAINE) 2 % solution Patient: Mix 1part 2% viscous lidocaine, 1part H20. Swish and/or swallow 68mL of this mixture, 61min before meals and at bedtime, up to QID 10/08/15   Holley Bouche, NP  morphine (MS CONTIN) 30 MG 12 hr tablet Take 1 tablet (30 mg total) by mouth every 12 (twelve) hours. 12/02/15   Holley Bouche, NP  Nutritional Supplements (ENSURE COMPLETE SHAKE) LIQD Take 1 Bottle by mouth 2 (two) times daily. 08/27/15   Volanda Napoleon, MD  nystatin (MYCOSTATIN) 100000 UNIT/ML suspension TK 5 ML PO QID FOR 14 DAYS 10/07/15   Historical Provider,  MD  omeprazole (PRILOSEC) 40 MG capsule Take 1 capsule (40 mg total) by mouth daily. Patient not taking: Reported on 11/05/2015 10/27/15   Milus Banister, MD  ondansetron (ZOFRAN) 8 MG tablet Take 1 tablet (8 mg total) by mouth every 8 (eight) hours as needed for nausea or vomiting. Patient not taking: Reported on 11/05/2015 09/17/15   Kyung Rudd, MD  orphenadrine (NORFLEX) 100 MG tablet  10/31/15   Historical Provider, MD  oxyCODONE (OXY IR/ROXICODONE) 5 MG immediate release tablet Take 1 tablet (5 mg total) by mouth every 6 (six) hours as needed for moderate pain or severe pain. 11/23/15   Holley Bouche, NP   polyethylene glycol [facility]) packet Take 17 g by mouth daily. Patient not taking: Reported on 11/05/2015 10/08/15   Holley Bouche, NP  pregabalin (LYRICA) 50 MG capsule Take 50 mg by mouth 3 (three) times daily.  06/03/15 06/02/16  Historical Provider, MD  sucralfate (CARAFATE) 1 G tablet Dissolve 1 tablet in 10 mL H20 and swallow up to QID 09/06/15   Eppie Gibson, MD    ALLERGIES:  Allergies  Allergen Reactions  . Ancef [Cefazolin] Hives  . Ciprofloxacin Hives  . Penicillin G Rash    Pt states "some kind of Penicillin. Not sure what kind" causes her to break out in rash on chest  . Wasp Venom Swelling    Facial and cranial swelling    SOCIAL HISTORY:  Social History  Substance Use Topics  . Smoking status: Current Every Day Smoker -- 0.00 packs/day for 32 years    Types: Cigarettes    Start date: 01/13/1986  . Smokeless tobacco: Never Used     Comment: quit smoking December 2015  . Alcohol Use: No    FAMILY HISTORY: Family History  Problem Relation Age of Onset  . Stomach cancer Sister   . Heart disease Paternal Grandfather   . Alzheimer's disease Paternal Grandmother   . Heart disease Maternal Grandmother   . Stroke Maternal Grandmother   . Other Mother     part of intestines taken out    EXAM: BP 104/75 mmHg  Pulse 95  Temp(Src) 99.1 F (37.3 C) (Oral)  Resp 16  SpO2 99% CONSTITUTIONAL: Alert and oriented and responds appropriately to questions. Well-appearing; well-nourished, chronically ill-appearing, appears drowsy but arousable HEAD: Normocephalic EYES: Conjunctivae clear, PERRL ENT: normal nose; no rhinorrhea; moist mucous membranes; pharynx without lesions noted, chronically hoarse voice NECK: Supple, no meningismus, no LAD  CARD: RRR; S1 and S2 appreciated; no murmurs, no clicks, no rubs, no gallops RESP: Normal chest excursion without splinting or tachypnea; breath sounds clear and equal bilaterally; no wheezes, no rhonchi, no rales, no hypoxia or  respiratory distress, speaking full sentences ABD/GI: Normal bowel sounds; non-distended; soft, non-tender, no rebound, no guarding, no peritoneal signs BACK:  The back appears normal and is non-tender to palpation, there is no CVA tenderness EXT: Tender to palpation over the right hip and right anterior thigh without swelling, ecchymosis or deformity. No erythema or warmth. No sign of joint effusion. Full range of motion in all joints without pain. 2+ DP pulses bilaterally. Normal ROM in all joints; otherwise extremities are non-tender to palpation; no edema; normal capillary refill; no cyanosis, no calf tenderness or swelling    SKIN: Normal color for age and race; warm NEURO: Moves all extremities equally, sensation to light touch intact diffusely, cranial nerves II through XII intact PSYCH: The patient's mood and manner are appropriate. Grooming and personal  hygiene are appropriate.  MEDICAL DECISION MAKING: Patient here with chronic right hip and thigh pain. We'll obtain x-rays to evaluate for possible pathologic fracture. She did have a PET scan however 2 days ago that showed no abnormal uptake in her bones. She has no swelling in this leg but given her history of cancer she is at risk for DVT. Will give dose of therapeutic Lovenox in order venous Doppler for the morning that she can come back for. There is no sign of infection on exam. No sign of septic arthritis. She is neurovascularly intact distally. Will give dose of IM morphine in the emergency part. She states she does have oxycodone at home.  ED PROGRESS: X-ray show no fracture dislocation. She is able to ambulate. Have offered her crutches but she states she has some at home. She will come back in the morning for a venous Doppler of her lower extremity. Has received a dose of therapeutic Lovenox in the ED. Pain is been controlled with IM morphine. I feel she is safe to be discharged home with outpatient follow-up. Discussed return  precautions. She verbalized understanding and is comfortable with this plan.     I personally performed the services described in this documentation, which was scribed in my presence. The recorded information has been reviewed and is accurate.   [place], DO 01/29/16 [phone]

## 2016-01-29 NOTE — ED Notes (Signed)
Patient continues to fall asleep during assessment. Rates pain as 10/10 in R leg, and then promptly fell asleep. Reports pain increased today.

## 2016-01-31 ENCOUNTER — Ambulatory Visit: Payer: Medicaid Other | Admitting: Infectious Diseases

## 2016-02-03 ENCOUNTER — Other Ambulatory Visit (HOSPITAL_COMMUNITY): Payer: Medicaid Other

## 2016-02-03 ENCOUNTER — Ambulatory Visit (HOSPITAL_COMMUNITY): Payer: No Typology Code available for payment source

## 2016-02-04 ENCOUNTER — Ambulatory Visit: Payer: No Typology Code available for payment source | Admitting: Radiation Oncology

## 2016-02-04 ENCOUNTER — Inpatient Hospital Stay: Admission: RE | Admit: 2016-02-04 | Payer: Medicaid Other | Source: Ambulatory Visit | Admitting: Radiation Oncology

## 2016-02-16 ENCOUNTER — Encounter (HOSPITAL_COMMUNITY): Payer: Self-pay

## 2016-02-16 ENCOUNTER — Emergency Department (HOSPITAL_COMMUNITY): Payer: Medicaid Other

## 2016-02-16 ENCOUNTER — Emergency Department (HOSPITAL_COMMUNITY)
Admission: EM | Admit: 2016-02-16 | Discharge: 2016-02-16 | Disposition: A | Discharge status: Home / Self Care | Payer: Medicaid Other | Attending: Emergency Medicine | Admitting: Emergency Medicine

## 2016-02-16 DIAGNOSIS — F17210 Nicotine dependence, cigarettes, uncomplicated: Secondary | ICD-10-CM | POA: Insufficient documentation

## 2016-02-16 DIAGNOSIS — R05 Cough: Secondary | ICD-10-CM | POA: Diagnosis not present

## 2016-02-16 DIAGNOSIS — M25511 Pain in right shoulder: Secondary | ICD-10-CM | POA: Insufficient documentation

## 2016-02-16 DIAGNOSIS — B20 Human immunodeficiency virus [HIV] disease: Secondary | ICD-10-CM | POA: Diagnosis not present

## 2016-02-16 NOTE — ED Notes (Signed)
Per EMS, Pt, from home, c/o R shoulder pain starting this afternoon and cough x "a couple days."  Pain score 10/10.  Pt reports ice decreases pain, but deep breathing and coughing increase pain.  Hx of throat and neck CA.  Last radiation/treatment x 3 months ago.  Pt took prescribed medication prior to transport.  Pt is treated at [facility].

## 2016-02-16 NOTE — ED Notes (Signed)
HAS been called x 3 . Pt is not in lobby nor near department.

## 2016-03-08 ENCOUNTER — Encounter: Payer: Self-pay | Admitting: Infectious Diseases

## 2016-03-08 ENCOUNTER — Ambulatory Visit: Payer: Medicaid Other | Admitting: Infectious Diseases

## 2016-03-08 ENCOUNTER — Ambulatory Visit (INDEPENDENT_AMBULATORY_CARE_PROVIDER_SITE_OTHER): Payer: Medicaid Other | Admitting: Infectious Diseases

## 2016-03-08 VITALS — BP 114/71 | HR 73 | Temp 97.9°F | Ht 67.0 in | Wt 111.0 lb

## 2016-03-08 DIAGNOSIS — B1920 Unspecified viral hepatitis C without hepatic coma: Secondary | ICD-10-CM | POA: Insufficient documentation

## 2016-03-08 DIAGNOSIS — B182 Chronic viral hepatitis C: Secondary | ICD-10-CM

## 2016-03-08 DIAGNOSIS — B20 Human immunodeficiency virus [HIV] disease: Secondary | ICD-10-CM | POA: Diagnosis not present

## 2016-03-08 MED ORDER — EMTRICITABINE-TENOFOVIR AF 200-25 MG PO TABS: 1.0000 | ORAL_TABLET | Freq: Every day | ORAL | Status: DC

## 2016-03-08 NOTE — Progress Notes (Signed)
   Subjective:    Patient ID: [name], female    DOB: May 16, 1966, 50 y.o.   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 She has completed XRT for this and is cancer free.  She is also HIV+ since 1999, and has been followed at [facility]- on complera. Has had no difficulties with this except for "gas".  She also has a hx of Hep C. Her Hep C Vl was negative 12-2014. She also has a hx of ETOH abuse. Has been clean for 4 years. Went to [place] for 4 months, then has been clean, due to prayer and her grand-daughter.   She ran out of her complera 2 days ago.   HIV 1 RNA QUANT (copies/mL)  Date Value  12/23/2015 <20   CD4 T CELL ABS (/uL)  Date Value  12/23/2015 140*   The past medical history, family history and social history were reviewed/updated in EPIC Husband had Hep C and he died from cirrhosis.   Rare tobacco use (couple puffs every 1-2 days). Getting patches from quit line.   Review of Systems  Constitutional: Positive for unexpected weight change. Negative for appetite change.  HENT: Positive for mouth sores.   Respiratory: Negative for cough and shortness of breath.   Cardiovascular: Negative for chest pain.  Gastrointestinal: Negative for diarrhea and constipation.  Genitourinary: Negative for difficulty urinating.  has been getting blisters in her mouth, gets every 6 months.   Pap smear coming up next month. Last mammo 7 months ago (-) Has PCP- [facility] ([facility]) Sees pain clinic at [facility] Refuses flu shot, PNVX    Objective:   Physical Exam  Constitutional: She appears well-developed and well-nourished.  HENT:  Mouth/Throat: No oropharyngeal exudate.  Eyes: EOM are normal. Pupils are equal, round, and reactive to light.  Neck:  Firmness of upper neck.   Cardiovascular: Normal rate, regular rhythm and normal heart sounds.   Pulmonary/Chest: Effort normal and breath sounds normal.  Abdominal: Soft. Bowel sounds are normal. There is no  tenderness. There is no rebound.  Musculoskeletal: She exhibits no edema.  Lymphadenopathy:    She has no cervical adenopathy.       Assessment & Plan:

## 2016-03-08 NOTE — Assessment & Plan Note (Addendum)
Will check her Hep C RNA Otherwise consider her to be immune/exposed.  refuses Hep A/B series

## 2016-03-08 NOTE — Assessment & Plan Note (Addendum)
Will change her to odefsy Take with food.  Will watch her CD4 and keep off bactrim until f/u Offered/refused condoms.  Refuses flu shot rtc 2-3 months

## 2016-03-09 LAB — HEPATITIS C RNA QUANTITATIVE: HCV Quantitative: NOT DETECTED IU/mL (ref <15)

## 2016-03-11 ENCOUNTER — Emergency Department (HOSPITAL_COMMUNITY)
Admission: EM | Admit: 2016-03-11 | Discharge: 2016-03-11 | Disposition: A | Discharge status: Home / Self Care | Payer: Medicaid Other | Attending: Emergency Medicine | Admitting: Emergency Medicine

## 2016-03-11 ENCOUNTER — Encounter (HOSPITAL_COMMUNITY): Payer: Self-pay

## 2016-03-11 ENCOUNTER — Emergency Department (HOSPITAL_COMMUNITY): Payer: Medicaid Other

## 2016-03-11 DIAGNOSIS — R110 Nausea: Secondary | ICD-10-CM | POA: Insufficient documentation

## 2016-03-11 DIAGNOSIS — Z87891 Personal history of nicotine dependence: Secondary | ICD-10-CM | POA: Diagnosis not present

## 2016-03-11 DIAGNOSIS — Z880 Allergy status to penicillin: Secondary | ICD-10-CM | POA: Diagnosis not present

## 2016-03-11 DIAGNOSIS — Z791 Long term (current) use of non-steroidal anti-inflammatories (NSAID): Secondary | ICD-10-CM | POA: Insufficient documentation

## 2016-03-11 DIAGNOSIS — Z79899 Other long term (current) drug therapy: Secondary | ICD-10-CM | POA: Diagnosis not present

## 2016-03-11 DIAGNOSIS — D849 Immunodeficiency, unspecified: Secondary | ICD-10-CM | POA: Insufficient documentation

## 2016-03-11 DIAGNOSIS — Z8619 Personal history of other infectious and parasitic diseases: Secondary | ICD-10-CM | POA: Diagnosis not present

## 2016-03-11 DIAGNOSIS — Z21 Asymptomatic human immunodeficiency virus [HIV] infection status: Secondary | ICD-10-CM | POA: Insufficient documentation

## 2016-03-11 DIAGNOSIS — Z85828 Personal history of other malignant neoplasm of skin: Secondary | ICD-10-CM | POA: Diagnosis not present

## 2016-03-11 DIAGNOSIS — M542 Cervicalgia: Secondary | ICD-10-CM | POA: Insufficient documentation

## 2016-03-11 DIAGNOSIS — F419 Anxiety disorder, unspecified: Secondary | ICD-10-CM | POA: Insufficient documentation

## 2016-03-11 DIAGNOSIS — R531 Weakness: Secondary | ICD-10-CM | POA: Insufficient documentation

## 2016-03-11 DIAGNOSIS — Z79891 Long term (current) use of opiate analgesic: Secondary | ICD-10-CM | POA: Diagnosis not present

## 2016-03-11 LAB — CBC WITH DIFFERENTIAL/PLATELET
BASOS ABS: 0 10*3/uL (ref 0.0–0.1)
Basophils Relative: 0 %
Eosinophils Absolute: 0.1 10*3/uL (ref 0.0–0.7)
Eosinophils Relative: 2 %
HEMATOCRIT: 38.3 % (ref 36.0–46.0)
Hemoglobin: 12.6 g/dL (ref 12.0–15.0)
LYMPHS ABS: 1.6 10*3/uL (ref 0.7–4.0)
LYMPHS PCT: 20 %
MCH: 29.6 pg (ref 26.0–34.0)
MCHC: 32.9 g/dL (ref 30.0–36.0)
MCV: 90.1 fL (ref 78.0–100.0)
MONO ABS: 0.6 10*3/uL (ref 0.1–1.0)
MONOS PCT: 7 %
NEUTROS ABS: 5.7 10*3/uL (ref 1.7–7.7)
Neutrophils Relative %: 71 %
Platelets: 201 10*3/uL (ref 150–400)
RBC: 4.25 MIL/uL (ref 3.87–5.11)
RDW: 12.4 % (ref 11.5–15.5)
WBC: 7.9 10*3/uL (ref 4.0–10.5)

## 2016-03-11 LAB — COMPREHENSIVE METABOLIC PANEL WITH GFR
ALT: 15 U/L (ref 14–54)
AST: 18 U/L (ref 15–41)
Albumin: 3.5 g/dL (ref 3.5–5.0)
Alkaline Phosphatase: 98 U/L (ref 38–126)
Anion gap: 9 (ref 5–15)
BUN: <5 mg/dL — ABNORMAL LOW (ref 6–20)
CO2: 24 mmol/L (ref 22–32)
Calcium: 9.3 mg/dL (ref 8.9–10.3)
Chloride: 103 mmol/L (ref 101–111)
Creatinine, Ser: 0.61 mg/dL (ref 0.44–1.00)
GFR calc Af Amer: >60 mL/min
GFR calc non Af Amer: >60 mL/min
Glucose, Bld: 104 mg/dL — ABNORMAL HIGH (ref 65–99)
Potassium: 3.5 mmol/L (ref 3.5–5.1)
Sodium: 136 mmol/L (ref 135–145)
Total Bilirubin: 0.4 mg/dL (ref 0.3–1.2)
Total Protein: 6.5 g/dL (ref 6.5–8.1)

## 2016-03-11 MED ORDER — HYDROMORPHONE HCL 1 MG/ML IJ SOLN
1.0000 mg | Freq: Once | INTRAMUSCULAR | Status: AC
  Administered 2016-03-11: 1 mg via INTRAVENOUS
  Filled 2016-03-11: qty 1

## 2016-03-11 MED ORDER — FENTANYL CITRATE (PF) 100 MCG/2ML IJ SOLN
50.0000 ug | Freq: Once | INTRAMUSCULAR | Status: AC
  Administered 2016-03-11: 50 ug via INTRAVENOUS
  Filled 2016-03-11: qty 2

## 2016-03-11 MED ORDER — KETOROLAC TROMETHAMINE 30 MG/ML IJ SOLN
30.0000 mg | Freq: Once | INTRAMUSCULAR | Status: AC
  Administered 2016-03-11: 30 mg via INTRAVENOUS
  Filled 2016-03-11: qty 1

## 2016-03-11 MED ORDER — IOPAMIDOL (ISOVUE-300) INJECTION 61%
INTRAVENOUS | Status: AC
  Administered 2016-03-11: 75 mL
  Filled 2016-03-11: qty 75

## 2016-03-11 MED ORDER — SODIUM CHLORIDE 0.9 % IV BOLUS (SEPSIS)
1000.0000 mL | Freq: Once | INTRAVENOUS | Status: AC
  Administered 2016-03-11: 1000 mL via INTRAVENOUS

## 2016-03-11 NOTE — Discharge Instructions (Signed)
As discussed, your evaluation today has been largely reassuring.  But, it is important that you monitor your condition carefully, and do not hesitate to return to the ED if you develop new, or concerning changes in your condition. ? ?Otherwise, please follow-up with your physician for appropriate ongoing care. ? ?

## 2016-03-11 NOTE — ED Provider Notes (Signed)
CSN: [id number]     Arrival date & time 03/11/16  1624 History   First MD Initiated Contact with Patient 03/11/16 1706     Chief Complaint  Patient presents with  . Neck Pain     (Consider location/radiation/quality/duration/timing/severity/associated sxs/prior Treatment) HPI Patient presents with concern of pain, pressure in the right lateral mandibular region. Pain has been present for possibly 2 weeks, worsening over that time in spite of using home medication, which includes Cymbalta, Valium, oxycodone, methadone. Patient has difficulty with swallowing and speaking. Patient has history of HIV, head and neck cancer Last radiation therapy was 4 months ago, and the patient states that she is currently in a maintenance phase. Prior to 2 weeks ago, no similar pain in that area, as severe. She also complains of fever, chills, nausea, weakness.  Past Medical History  Diagnosis Date  . HIV (human immunodeficiency virus infection) ([place])   . HCV (hepatitis C virus)     chronic , RNA negative 2016, never treated  . 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 Alcohol abuse   . Anemia   . Anxiety    Past Surgical History  Procedure Laterality Date  . Cesarean section      x 3  . Laryngoscopy  12/31/14   Family History  Problem Relation Age of Onset  . Stomach cancer Sister   . Heart disease Paternal Grandfather   . Alzheimer's disease Paternal Grandmother   . Heart disease Maternal Grandmother   . Stroke Maternal Grandmother   . Other Mother     part of intestines taken out  . Other Father     trauma  . Dementia Mother    Social History  Substance Use Topics  . Smoking status: Former Smoker -- 0.00 packs/day for 32 years    Types: Cigarettes    Start date: 01/13/1986  . Smokeless tobacco: Never Used     Comment: occassional cigarette  . Alcohol Use: No   OB History    No data available     Review of Systems  Constitutional:       Per HPI, otherwise  negative  HENT:       Per HPI, otherwise negative  Respiratory:       Per HPI, otherwise negative  Cardiovascular:       Per HPI, otherwise negative  Gastrointestinal: Positive for nausea. Negative for vomiting.  Endocrine:       Negative aside from HPI  Genitourinary:       Neg aside from HPI   Musculoskeletal:       Per HPI, otherwise negative  Skin: Negative.   Allergic/Immunologic: Positive for immunocompromised state.  Neurological: Positive for weakness. Negative for syncope.      Allergies  Ancef; Ciprofloxacin; Penicillin g; and Wasp venom  Home Medications   Prior to Admission medications   Medication Sig Start Date End Date Taking? Authorizing Provider  ascorbic acid (VITAMIN C) 500 MG tablet Take 500 mg by mouth daily.   Yes Historical Provider, MD  b complex vitamins tablet Take 1 tablet by mouth every other day. Reported on 03/08/2016   Yes Historical Provider, MD  diazepam (VALIUM) 5 MG tablet Take 5 mg by mouth every 12 (twelve) hours as needed for anxiety.  02/23/16  Yes Historical Provider, MD  diclofenac sodium (VOLTAREN) 1 % GEL Apply 2 g topically as needed (for neck pain).  06/03/15  Yes Historical Provider, MD  DULoxetine (CYMBALTA) 30  MG capsule Take 2 capsules (60 mg total) by mouth daily. 11/23/15 11/22/16 Yes Holley Bouche, NP  emtricitabine-rilpivir-tenofovir DF (COMPLERA) 200-25-300 MG tablet Take 1 tablet by mouth daily.   Yes Historical Provider, MD  pregabalin (LYRICA) 50 MG capsule Take 50 mg by mouth 3 (three) times daily.  06/03/15 06/02/16 Yes Historical Provider, MD  emollient (BIAFINE) cream Apply 90 g topically 4 (four) times daily as needed (FOR RADIATION).     Historical Provider, MD  emtricitabine-tenofovir AF (DESCOVY) 200-25 MG tablet Take 1 tablet by mouth daily. 03/08/16   Campbell Riches, MD  ibuprofen (ADVIL,MOTRIN) 600 MG tablet Take 1,800 mg by mouth at bedtime.     Historical Provider, MD  lidocaine (XYLOCAINE) 2 % solution Patient:  Mix 1part 2% viscous lidocaine, 1part H20. Swish and/or swallow 14mL of this mixture, 21min before meals and at bedtime, up to QID 10/08/15   Holley Bouche, NP  methadone (DOLOPHINE) 5 MG tablet Take 5 mg by mouth. 02/23/16   Historical Provider, MD  Naloxone HCl (NARCAN) 4 MG/0.1ML LIQD Place into the nose. 12/24/15   Historical Provider, MD  Nutritional Supplements (ENSURE COMPLETE SHAKE) LIQD Take 1 Bottle by mouth 2 (two) times daily. 08/27/15   Volanda Napoleon, MD  ondansetron [facility]) 4 MG tablet Take by mouth. 08/17/14   Historical Provider, MD  orphenadrine (NORFLEX) 100 MG tablet  10/31/15   Historical Provider, MD  oxyCODONE (ROXICODONE) 5 MG immediate release tablet Take 5 mg by mouth. 02/23/16   Historical Provider, MD  sucralfate (CARAFATE) 1 G tablet Dissolve 1 tablet in 10 mL H20 and swallow up to QID Patient not taking: Reported on 03/08/2016 09/06/15   Eppie Gibson, MD   BP 126/104 mmHg  Pulse 97  Temp(Src) 98.7 F (37.1 C) (Oral)  Resp 18  Wt 110 lb 4.8 oz (50.032 kg)  SpO2 96% Physical Exam  Constitutional: She is oriented to person, place, and time.  Uncomfortable appearing thin female sitting on the side of the bed, holding the right side of her face.  HENT:  Head: Normocephalic and atraumatic.    Eyes: Conjunctivae and EOM are normal.  Cardiovascular: Normal rate and regular rhythm.   Pulmonary/Chest: Effort normal and breath sounds normal. No stridor. No respiratory distress.  Abdominal: She exhibits no distension.  Musculoskeletal: She exhibits no edema.  Neurological: She is alert and oriented to person, place, and time. No cranial nerve deficit.  Skin: Skin is warm and dry.  Psychiatric: Her mood appears anxious.  Nursing note and vitals reviewed.   ED Course  Procedures (including critical care time) Labs Review Labs Reviewed  COMPREHENSIVE METABOLIC PANEL - Abnormal; Notable for the following:    Glucose, Bld 104 (*)    BUN <5 (*)    All other components  within normal limits  CBC WITH DIFFERENTIAL/PLATELET  CD4/CD8 (T-HELPER/T-SUPPRESSOR CELL)    Imaging Review Ct Soft Tissue Neck W Contrast  03/11/2016  CLINICAL DATA:  50 year old female with 2 weeks of right lateral neck pain. Personal history of treated supraglottic laryngeal cancer. Subsequent encounter. EXAM: CT NECK WITH CONTRAST TECHNIQUE: Multidetector CT imaging of the neck was performed using the standard protocol following the bolus administration of intravenous contrast. CONTRAST:  1 ISOVUE-300 IOPAMIDOL (ISOVUE-300) INJECTION 61% COMPARISON:  Neck MRI 07/15/2015. Restaging Chest CT 07/23/2015. Neck CT 12/15/2014. FINDINGS: Pharynx and larynx: Diffuse pharyngeal mucosal space soft tissue thickening and edema, probably the sequela of radiation therapy. There remains thickening and distortion of the  epiglottic contour (series 4, image 58), with no definite residual enhancing epiglottic or laryngeal tumor. Negative superior parapharyngeal spaces. Trace if any retropharyngeal effusion. Salivary glands: Postradiation changes to the submandibular glands with hyper enhancement. Obscuration of submandibular space soft tissue planes. Negative sublingual space. Parotid glands remain within normal limits. Thyroid: Negative. Lymph nodes: The bulky and necrotic right level 2A lymphadenopathy has resolved. Residual lymph node there measures 7 mm short axis on series 4, image 50. Smaller necrotic left level 2 nodes also have regressed, with residual 7-8 mm node on series 4, image 48. These nodes were 10 mm short axis on the comparison MRI. No increased cervical lymph nodes. No persistent abnormally enlarged nodes. Vascular: Right IJ approach chest porta cath. Major vascular structures at the thoracic inlet, in the neck, and at the skullbase are patent. Limited intracranial: Negative. Visualized orbits: Negative. Mastoids and visualized paranasal sinuses: Stable in clear. Skeleton: Absent dentition. Stable  cervical spine. No acute or suspicious osseous lesion. Upper chest: Stable and negative visualized upper lungs. No superior mediastinal lymphadenopathy. IMPRESSION: 1. Diffuse pharyngeal mucosal space edema most likely is the sequelae of radiation therapy. Persistent distortion of the epiglottic contour, but no discrete laryngeal tumor. 2. Resolved bilateral necrotic lymphadenopathy with further decreased residual lymph node size since 2016. 3. No explanation for acute right neck pain identified. No new abnormality identified in the neck. Electronically Signed   By: Genevie Ann M.D.   On: 03/11/2016 20:39   I have personally reviewed and evaluated these images and lab results as part of my medical decision-making.  On repeat exam the patient is in no distress per She appears calm. She requests a second dose of IV analgesia, prior to discharge  MDM  Patient with history of head and neck cancer, HIV presents with ongoing pain in the right lateral head and face per Here she is afebrile, awake, alert, with no evidence for respiratory compromise, bacteremia, sepsis. CT scan does not demonstrates acute new pathology, and findings are consistent with likely malignancy or radiation therapy induced pain, swelling. Patient had improvement here, was discharged in stable condition.  Carmin Muskrat, MD 03/11/16 [phone]
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
## 2016-03-11 NOTE — ED Notes (Signed)
Patient asked to speak with [employer] officer. [employer] called

## 2016-03-11 NOTE — ED Notes (Signed)
Patient able to ambulate independently  

## 2016-03-13 LAB — CD4/CD8 (T-HELPER/T-SUPPRESSOR CELL)
CD4 absolute: 330 /uL — ABNORMAL LOW (ref 500–1900)
CD4%: 23 % — ABNORMAL LOW (ref 30.0–60.0)
CD8 T Cell Abs: 620 /uL (ref 230–1000)
CD8tox: 43 % — ABNORMAL HIGH (ref 15.0–40.0)
Ratio: 0.54 — ABNORMAL LOW (ref 1.0–3.0)
Total lymphocyte count: 1450 /uL (ref 1000–4000)

## 2016-03-13 MED ORDER — EMTRICITAB-RILPIVIR-TENOFOV AF 200-25-25 MG PO TABS: 1.0000 | ORAL_TABLET | Freq: Every day | ORAL | Status: DC

## 2016-03-13 NOTE — Addendum Note (Signed)
Addended by: Sagan Maselli C on: 03/13/2016 02:36 PM   Modules accepted: Orders, Medications

## 2016-03-31 ENCOUNTER — Other Ambulatory Visit (HOSPITAL_COMMUNITY): Payer: Self-pay | Admitting: Internal Medicine

## 2016-03-31 DIAGNOSIS — C329 Malignant neoplasm of larynx, unspecified: Secondary | ICD-10-CM

## 2016-04-04 ENCOUNTER — Other Ambulatory Visit: Payer: Self-pay | Admitting: Radiology

## 2016-04-05 ENCOUNTER — Other Ambulatory Visit (HOSPITAL_COMMUNITY): Payer: Medicaid Other

## 2016-04-05 ENCOUNTER — Encounter: Payer: Self-pay | Admitting: *Deleted

## 2016-04-05 ENCOUNTER — Ambulatory Visit (HOSPITAL_COMMUNITY): Payer: Medicaid Other

## 2016-04-21 ENCOUNTER — Other Ambulatory Visit (HOSPITAL_COMMUNITY): Payer: Medicaid Other

## 2016-05-01 ENCOUNTER — Emergency Department (HOSPITAL_COMMUNITY)
Admission: EM | Admit: 2016-05-01 | Discharge: 2016-05-02 | Disposition: A | Discharge status: Home / Self Care | Payer: Medicaid Other | Attending: Emergency Medicine | Admitting: Emergency Medicine

## 2016-05-01 ENCOUNTER — Encounter (HOSPITAL_COMMUNITY): Payer: Self-pay | Admitting: Emergency Medicine

## 2016-05-01 DIAGNOSIS — Y92009 Unspecified place in unspecified non-institutional (private) residence as the place of occurrence of the external cause: Secondary | ICD-10-CM | POA: Insufficient documentation

## 2016-05-01 DIAGNOSIS — S29002A Unspecified injury of muscle and tendon of back wall of thorax, initial encounter: Secondary | ICD-10-CM | POA: Insufficient documentation

## 2016-05-01 DIAGNOSIS — W1839XA Other fall on same level, initial encounter: Secondary | ICD-10-CM | POA: Insufficient documentation

## 2016-05-01 DIAGNOSIS — Y998 Other external cause status: Secondary | ICD-10-CM | POA: Diagnosis not present

## 2016-05-01 DIAGNOSIS — S0990XA Unspecified injury of head, initial encounter: Secondary | ICD-10-CM | POA: Insufficient documentation

## 2016-05-01 DIAGNOSIS — B20 Human immunodeficiency virus [HIV] disease: Secondary | ICD-10-CM | POA: Diagnosis not present

## 2016-05-01 DIAGNOSIS — Y9389 Activity, other specified: Secondary | ICD-10-CM | POA: Insufficient documentation

## 2016-05-01 LAB — CBC WITH DIFFERENTIAL/PLATELET
Basophils Absolute: 0 10*3/uL (ref 0.0–0.1)
Basophils Relative: 1 %
EOS ABS: 0.1 10*3/uL (ref 0.0–0.7)
Eosinophils Relative: 3 %
HEMATOCRIT: 36.4 % (ref 36.0–46.0)
HEMOGLOBIN: 12.5 g/dL (ref 12.0–15.0)
LYMPHS PCT: 32 %
Lymphs Abs: 1.3 10*3/uL (ref 0.7–4.0)
MCH: 30.9 pg (ref 26.0–34.0)
MCHC: 34.3 g/dL (ref 30.0–36.0)
MCV: 90.1 fL (ref 78.0–100.0)
MONOS PCT: 14 %
Monocytes Absolute: 0.6 10*3/uL (ref 0.1–1.0)
Neutro Abs: 2.1 10*3/uL (ref 1.7–7.7)
Neutrophils Relative %: 50 %
Platelets: 196 10*3/uL (ref 150–400)
RBC: 4.04 MIL/uL (ref 3.87–5.11)
RDW: 13.2 % (ref 11.5–15.5)
WBC: 4.2 10*3/uL (ref 4.0–10.5)

## 2016-05-01 NOTE — ED Notes (Signed)
Pt. Lost her balance and fell at home this evening , denies LOC / ambulatory , reports pain at right upper back with headache , abrasion at right shoulder , pain increases with movement . Alert and oriented /respirations unlabored.

## 2016-05-02 LAB — COMPREHENSIVE METABOLIC PANEL
ALBUMIN: 3.9 g/dL (ref 3.5–5.0)
ALK PHOS: 89 U/L (ref 38–126)
ALT: 17 U/L (ref 14–54)
ANION GAP: 7 (ref 5–15)
AST: 21 U/L (ref 15–41)
BUN: 8 mg/dL (ref 6–20)
CALCIUM: 9.4 mg/dL (ref 8.9–10.3)
CO2: 26 mmol/L (ref 22–32)
CREATININE: 0.63 mg/dL (ref 0.44–1.00)
Chloride: 103 mmol/L (ref 101–111)
GFR calc Af Amer: >60 mL/min (ref >60)
GFR calc non Af Amer: >60 mL/min (ref >60)
GLUCOSE: 89 mg/dL (ref 65–99)
Potassium: 3.8 mmol/L (ref 3.5–5.1)
Sodium: 136 mmol/L (ref 135–145)
TOTAL PROTEIN: 7.2 g/dL (ref 6.5–8.1)
Total Bilirubin: 0.5 mg/dL (ref 0.3–1.2)

## 2016-05-02 LAB — URINALYSIS, ROUTINE W REFLEX MICROSCOPIC
BILIRUBIN URINE: NEGATIVE
Glucose, UA: NEGATIVE mg/dL
Hgb urine dipstick: NEGATIVE
KETONES UR: NEGATIVE mg/dL
Nitrite: NEGATIVE
PH: 7 (ref 5.0–8.0)
Protein, ur: NEGATIVE mg/dL
SPECIFIC GRAVITY, URINE: 1.013 (ref 1.005–1.030)

## 2016-05-02 LAB — URINE MICROSCOPIC-ADD ON

## 2016-05-02 NOTE — ED Notes (Signed)
Per emt, pt and family came to desk and stated they were leaving

## 2016-06-05 ENCOUNTER — Ambulatory Visit (INDEPENDENT_AMBULATORY_CARE_PROVIDER_SITE_OTHER): Payer: Medicaid Other | Admitting: Infectious Diseases

## 2016-06-05 ENCOUNTER — Telehealth: Payer: Self-pay

## 2016-06-05 ENCOUNTER — Encounter: Payer: Self-pay | Admitting: Infectious Diseases

## 2016-06-05 VITALS — BP 113/71 | HR 85 | Temp 98.0°F | Wt 111.0 lb

## 2016-06-05 DIAGNOSIS — C321 Malignant neoplasm of supraglottis: Secondary | ICD-10-CM

## 2016-06-05 DIAGNOSIS — B182 Chronic viral hepatitis C: Secondary | ICD-10-CM | POA: Diagnosis not present

## 2016-06-05 DIAGNOSIS — R0609 Other forms of dyspnea: Secondary | ICD-10-CM | POA: Diagnosis not present

## 2016-06-05 DIAGNOSIS — B20 Human immunodeficiency virus [HIV] disease: Secondary | ICD-10-CM | POA: Diagnosis present

## 2016-06-05 MED ORDER — EMTRICITAB-RILPIVIR-TENOFOV AF 200-25-25 MG PO TABS: 1.0000 | ORAL_TABLET | Freq: Every day | ORAL | Status: DC

## 2016-06-05 MED ORDER — ALBUTEROL SULFATE HFA 108 (90 BASE) MCG/ACT IN AERS: 2.0000 | INHALATION_SPRAY | Freq: Four times a day (QID) | RESPIRATORY_TRACT | Status: DC | PRN

## 2016-06-05 NOTE — Progress Notes (Signed)
   Subjective:    Patient ID: [name], female    DOB: 1966/11/22, 50 y.o.   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 She has completed XRT (10-2015) for this and is cancer free.  She is scheduled for PET, ENT f/u 07-2016.  Still has some mild dysphagia, dry mouth (improving).  She is also HIV+ since 1999, and has been followed at [facility]- on complera.  Was seen in ID 02-2016 and changed to odefsy.  She also has a hx of Hep C. Her Hep C Vl was negative 12-2014 nad 02-2016. She also has a hx of ETOH abuse. Has been clean for 4 years.   HIV 1 RNA QUANT (copies/mL)  Date Value  12/23/2015 <20   CD4 T CELL ABS (/uL)  Date Value  12/23/2015 140*   Today complains of worsening bone pain. In hips and into lower leg. R > L.  Wt has come up 6#.  No problems with art. Prev used albuterol for DOE. Would like refill.    Review of Systems  Constitutional: Negative for appetite change and unexpected weight change.  Respiratory: Positive for shortness of breath.   Gastrointestinal: Negative for diarrhea and constipation.  Genitourinary: Negative for difficulty urinating.       Objective:   Physical Exam  Constitutional: She appears well-developed and well-nourished.  HENT:  Mouth/Throat: No oropharyngeal exudate.  Eyes: EOM are normal. Pupils are equal, round, and reactive to light.  Neck: Neck supple.  Cardiovascular: Normal rate, regular rhythm and normal heart sounds.   Pulmonary/Chest: Effort normal and breath sounds normal.  Abdominal: Soft. Bowel sounds are normal. There is no tenderness. There is no rebound.  Musculoskeletal: She exhibits no edema.  Lymphadenopathy:    She has no cervical adenopathy.       Assessment & Plan:

## 2016-06-05 NOTE — Assessment & Plan Note (Signed)
Suspect she may have element of COPD.  Will give her albuterol prn.

## 2016-06-05 NOTE — Assessment & Plan Note (Addendum)
She is doing well.  Will check her CD4 and HIV RNA today.  Will see her back in 6 months She has pap scheduled for f/u.  Will get her mammogram.  Hold on bactrim for now.

## 2016-06-05 NOTE — Telephone Encounter (Signed)
Patient scheduled at [place] for August 18th @ 10am for mammogram referral by Dr. Johnnye Sima. Directions and appointment printed and given to patient. Patient had mammogram last year on August 17 at this location. Rodman Key, LPN

## 2016-06-05 NOTE — Assessment & Plan Note (Signed)
She has ENT f/u She appears to be doing well.

## 2016-06-05 NOTE — Assessment & Plan Note (Signed)
Treated, resolved

## 2016-06-06 LAB — HIV-1 RNA QUANT-NO REFLEX-BLD
HIV 1 RNA QUANT: 444 {copies}/mL — AB (ref <20)
HIV-1 RNA Quant, Log: 2.65 Log copies/mL — ABNORMAL HIGH (ref <1.30)

## 2016-06-06 LAB — T-HELPER CELL (CD4) - (RCID CLINIC ONLY)
CD4 T CELL ABS: 360 /uL — AB (ref 400–2700)
CD4 T CELL HELPER: 30 % — AB (ref 33–55)

## 2016-06-08 ENCOUNTER — Ambulatory Visit: Payer: Medicaid Other | Admitting: Infectious Diseases

## 2016-06-16 NOTE — Progress Notes (Unsigned)
[name] is not eligible for [id number] at this time because her viral load was elevated from the draw recently. The screening visit was not completed.  We discussed checking back in with [place] in 6 months once she has brought her viral load back t< 20.

## 2016-07-07 ENCOUNTER — Telehealth: Payer: Self-pay | Admitting: *Deleted

## 2016-07-07 NOTE — Telephone Encounter (Signed)
Difficulty maintaining weight, bones are aching.  RN returned the patient's phone message.  RN advised contacting her oncology RN/MD/Dietician to discuss weight loss concerns.  Patient could also call her PCP nurse practitioner to discuss her concerns.  RN provided the phone numbers for these individuals.

## 2016-08-09 NOTE — Telephone Encounter (Signed)
error 

## 2016-08-28 ENCOUNTER — Telehealth: Payer: Self-pay

## 2016-08-28 NOTE — Telephone Encounter (Signed)
Ms. [name] called today concerned about symptoms of arm and leg tingling. She has seen multiple doctors including her ENT and PCP and she tells me they feel like these symptoms might be related to the radiation she received to her Head and Neck. She has requested an appointment with Dr. Isidore Moos. I have informed the scheduler Suanne Marker) of the above. She will schedule her for a follow up appointment with Dr. Isidore Moos and notify her of the appointment date and time.
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
## 2016-09-06 ENCOUNTER — Ambulatory Visit
Admission: RE | Admit: 2016-09-06 | Discharge: 2016-09-06 | Disposition: A | Discharge status: Home / Self Care | Payer: Medicaid Other | Source: Ambulatory Visit | Attending: Radiation Oncology | Admitting: Radiation Oncology

## 2016-09-06 ENCOUNTER — Encounter: Payer: Self-pay | Admitting: Radiation Oncology

## 2016-09-06 DIAGNOSIS — F17210 Nicotine dependence, cigarettes, uncomplicated: Secondary | ICD-10-CM | POA: Insufficient documentation

## 2016-09-06 DIAGNOSIS — C321 Malignant neoplasm of supraglottis: Secondary | ICD-10-CM

## 2016-09-06 MED ORDER — VITAMIN E 180 MG (400 UNIT) PO CAPS: ORAL_CAPSULE | ORAL | 5 refills | Status: DC

## 2016-09-06 MED ORDER — PENTOXIFYLLINE ER 400 MG PO TBCR
EXTENDED_RELEASE_TABLET | ORAL | 5 refills | Status: DC
Start: 2016-09-06 — End: 2018-06-26
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
## 2016-09-08 ENCOUNTER — Other Ambulatory Visit: Payer: Self-pay | Admitting: Infectious Diseases

## 2016-09-08 ENCOUNTER — Other Ambulatory Visit: Payer: Self-pay | Admitting: Radiation Oncology

## 2016-09-08 ENCOUNTER — Telehealth: Payer: Self-pay | Admitting: *Deleted

## 2016-09-08 DIAGNOSIS — Z1231 Encounter for screening mammogram for malignant neoplasm of breast: Secondary | ICD-10-CM

## 2016-09-08 DIAGNOSIS — C321 Malignant neoplasm of supraglottis: Secondary | ICD-10-CM

## 2016-09-08 NOTE — Telephone Encounter (Signed)
CALLED PATIENT TO INFORM OF APPT. WITH DR. PATEL OF [facility] ON 09-19-16- ARRIVAL TIME - 2:45 PM, LVM FOR A RETURN CALL

## 2016-09-12 ENCOUNTER — Telehealth: Payer: Self-pay | Admitting: *Deleted

## 2016-09-12 NOTE — Telephone Encounter (Signed)
CALLED PATIENT TO INFORM THAT APPT. WITH DR. PATEL HAS BEEN MOVED TO 11-15-16 - ARRIVAL TIME - 9:45 AM , PH. NO. - [phone], ADDRESS- [id number], [place] [id number], SPOKE WITH PATIENT AND SHE IS AWARE OF THIS CHANGE, PATIENT DECLINED THE APPT. ON 09-19-16 WITH DR. PATEL DUE TO ANOTHER APPT.

## 2016-09-19 ENCOUNTER — Ambulatory Visit: Payer: Medicaid Other | Admitting: Neurology

## 2016-09-25 ENCOUNTER — Ambulatory Visit: Payer: Medicaid Other | Admitting: Infectious Diseases

## 2016-09-26 ENCOUNTER — Ambulatory Visit: Payer: Medicaid Other

## 2016-09-27 ENCOUNTER — Ambulatory Visit: Payer: Medicaid Other | Admitting: Family

## 2016-09-27 ENCOUNTER — Other Ambulatory Visit: Payer: Medicaid Other

## 2016-09-27 ENCOUNTER — Other Ambulatory Visit: Payer: Self-pay | Admitting: Family

## 2016-09-27 DIAGNOSIS — C321 Malignant neoplasm of supraglottis: Secondary | ICD-10-CM

## 2016-09-27 DIAGNOSIS — C329 Malignant neoplasm of larynx, unspecified: Secondary | ICD-10-CM

## 2016-09-27 DIAGNOSIS — E611 Iron deficiency: Secondary | ICD-10-CM

## 2016-10-12 ENCOUNTER — Ambulatory Visit: Payer: Medicaid Other | Admitting: *Deleted

## 2016-10-13 ENCOUNTER — Ambulatory Visit: Payer: Medicaid Other | Admitting: *Deleted

## 2016-10-13 DIAGNOSIS — F1020 Alcohol dependence, uncomplicated: Secondary | ICD-10-CM

## 2016-10-16 NOTE — BH Specialist Note (Signed)
[name] showed for her scheduled appointment today with counselor.  Patient was oriented times four with nervous affect.  Patient was concerned about the relationship with her children as a result

## 2016-10-20 ENCOUNTER — Ambulatory Visit: Payer: Medicaid Other | Admitting: *Deleted

## 2016-10-20 DIAGNOSIS — F329 Major depressive disorder, single episode, unspecified: Secondary | ICD-10-CM

## 2016-10-20 DIAGNOSIS — F32A Depression, unspecified: Secondary | ICD-10-CM

## 2016-10-20 NOTE — BH Specialist Note (Signed)
[name] showed for scheduled appointment today.  Patient was oriented times four with good affect and dress.  Patient was alert and talkative.  Patient communicated that she is still struggling with the fact that she needs to sit down with each of her children and communicate that the man who they believe is their father, may very well not be their biological father.  Patient struggled with how her children might react after she tells them. Counselor provided support and encouragement for patient. Counselor role played the conversation she would expect to have as she communicates to each one the information.  Patient was responsive and cooperative.  Patient made a decision that she would speak in person to her two sons but would probably prefer to write her daughter a letter.  Counselor encouraged patient to make an appointment in a couple of weeks to process through how the conversations went.  Counselor last discussed with patient her about appropriate coping skills.  Patient made an appointment for two weeks out.   Natalie Infante, MA, LPCA [facility]

## 2016-10-26 ENCOUNTER — Ambulatory Visit: Payer: Medicaid Other | Admitting: *Deleted

## 2016-10-31 ENCOUNTER — Ambulatory Visit: Payer: Medicaid Other | Admitting: *Deleted

## 2016-10-31 ENCOUNTER — Ambulatory Visit: Payer: Medicaid Other | Admitting: Infectious Diseases

## 2016-10-31 DIAGNOSIS — F411 Generalized anxiety disorder: Secondary | ICD-10-CM

## 2016-10-31 NOTE — Progress Notes (Signed)
[name] arrived for her scheduled appointment today with counselor.   Patient was oriented times four with good affect and dress.  Patient was alert and talkative.  Patient was in good spirits today she stated because she had spoken with one of her sons about the biological father issue.  Patient indicated that she was happy and relieved that she had managed to get up enough courage to speak to him about this awkward subject.  Patient said that she is trying to figure out the best way to speak to her other son about this same issue.  Patient shared that she is terrified to think that her son may reject her as a result of hearing the news. Counselor role played with patient and allowed her to speak to counselor and figure out how and what is best for communicating to her children.  Counselor worked with patient on preparing for the worst case scenario.  Counselor assisted patient in realizing that she is catastrophizing and that that is an irrational thought process.  Patient agreed and stated that coming to counseling helps her to move past her fear of what the consequences might or might not be in the end. Counselor provided support and encouragement for patient.  Counselor expressed to patient that she was making strides in her life and encouraged her to continue to follow through with her goals. Patient expressed gratitude with counselor and made another appointment for two weeks out.    Rolena Infante, MA, LPC [facility]

## 2016-11-14 ENCOUNTER — Ambulatory Visit: Payer: Medicaid Other | Admitting: Infectious Diseases

## 2016-11-15 ENCOUNTER — Encounter: Payer: Self-pay | Admitting: Neurology

## 2016-11-15 ENCOUNTER — Ambulatory Visit (INDEPENDENT_AMBULATORY_CARE_PROVIDER_SITE_OTHER): Payer: Medicaid Other | Admitting: Neurology

## 2016-11-15 ENCOUNTER — Other Ambulatory Visit (INDEPENDENT_AMBULATORY_CARE_PROVIDER_SITE_OTHER): Payer: Medicaid Other

## 2016-11-15 VITALS — BP 110/80 | HR 80 | Ht 67.0 in | Wt 125.2 lb

## 2016-11-15 DIAGNOSIS — R519 Headache, unspecified: Secondary | ICD-10-CM

## 2016-11-15 DIAGNOSIS — G3184 Mild cognitive impairment, so stated: Secondary | ICD-10-CM

## 2016-11-15 DIAGNOSIS — R202 Paresthesia of skin: Secondary | ICD-10-CM

## 2016-11-15 DIAGNOSIS — C801 Malignant (primary) neoplasm, unspecified: Secondary | ICD-10-CM

## 2016-11-15 DIAGNOSIS — R51 Headache: Secondary | ICD-10-CM

## 2016-11-15 DIAGNOSIS — Z859 Personal history of malignant neoplasm, unspecified: Secondary | ICD-10-CM

## 2016-11-15 LAB — TSH: TSH: 0.8 u[IU]/mL (ref 0.35–4.50)

## 2016-11-15 LAB — VITAMIN B12: Vitamin B-12: 497 pg/mL (ref 211–911)

## 2016-11-15 MED ORDER — GABAPENTIN 300 MG PO CAPS: ORAL_CAPSULE | ORAL | 5 refills | Status: DC

## 2016-11-15 NOTE — Addendum Note (Signed)
Addended by: Chester Holstein on: 11/15/2016 11:13 AM   Modules accepted: Orders

## 2016-11-15 NOTE — Progress Notes (Signed)
[facility] Clinic Note - Initial Visit   Date: 11/15/16  [name] MRN: [id number] DOB: 1966/09/03   Dear Dr. Isidore Moos:  Thank you for your kind referral of [name] for consultation of paresthesias and memory loss. Although her history is well known to you, please allow [place] to reiterate it for the purpose of our medical record. The patient was accompanied to the clinic by self.     History of Present Illness: [name] is a 50 y.o. 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    Starting 2015, she began complains of bilateral lower leg tingling sensation, which lasts for a few seconds.  She denies numbness of the feet.  Symptoms are sporadic, lasting a few seconds and occurs daily.  She complains of imbalance and has started taking yoga class to help. There is mild weakness of the left leg.  She also complains of tingling over the scalp and neck which occurred once over the last month.  She is sober since 2012, and previously used to drink liquor daily x 35 years.  She denies any tingling of the hands.  There was no change in her symptoms even after chemotherapy was stopped.   Since having chemotherapy, she began noticing increased forgetfulness, especially with names and makes a list for everything. She misplaces items frequently.  She does not repeat things frequently.  She denies getting lost.   She manages her own IADLs such as driving, manages her finances, and household chores.  Sometimes she forgets taking her medications and does not use a pillbox. She endorses difficulty with word-findings and following conversations.  Her mother and paternal grandmother both had Alzheimer's dementia.  Out-side paper records, electronic medical record, and images  have been reviewed where available and summarized as:  MRI lumbar spine wo contrast 10/11/2016: 1. At L2-3 there is a mild broad-based disc bulge and mild left lateral recess stenosis. 2. At L3-4 there is a broad-based disc bulge. 3. At L4-5 there is a broad-based disc bulge with mild right foraminal stenosis and mild spinal stenosis. 4. At L5-S1 there is a broad-based disc bulge and mild bilateral facet arthropathy.  Lab Results  Component Value Date   TSH 0.666 08/13/2015    Past Medical History:  Diagnosis Date  . Alcohol abuse   . Anemia   . Anxiety   . Cancer ([place]) 12/31/2014   squamous cell of larynx  . HCV (hepatitis C virus)    chronic , RNA negative 2016, never treated  . HIV (human immunodeficiency virus infection) ([place])     Past Surgical History:  Procedure Laterality Date  . CESAREAN SECTION     x 3  . LARYNGOSCOPY  12/31/14     Medications:  Outpatient Encounter Prescriptions as of 11/15/2016  Medication Sig Note  . albuterol (PROVENTIL HFA;VENTOLIN HFA) 108 (90 Base) MCG/ACT inhaler Inhale 2 puffs into the lungs every 6 (six) hours as needed for wheezing or shortness of breath.   . diclofenac sodium (VOLTAREN) 1 % GEL Apply 2 g topically as needed (for pain).    Marland Kitchen emtricitabine-rilpivir-tenofovir AF (ODEFSEY) 200-25-25 MG TABS tablet Take 1 tablet by mouth daily with breakfast.   . ibuprofen (ADVIL,MOTRIN) 200 MG tablet Take 600 mg by mouth every 6 (six) hours as needed for fever or moderate pain. Reported on  06/05/2016   . oxyCODONE (ROXICODONE) 5 MG immediate release tablet Take 5 mg by mouth every 6 (six) hours as needed for severe pain.    Marland Kitchen ascorbic acid (VITAMIN C) 500 MG tablet Take 500 mg by mouth daily.   . B Complex Vitamins (VITAMIN-B COMPLEX) TABS Take by mouth. 09/06/2016: Received from: [facility] Received Sig: Take 1 tablet by mouth.  . DULoxetine (CYMBALTA) 30 MG capsule Take 2 capsules (60 mg total) by mouth daily. (Patient  not taking: Reported on 11/15/2016)   . gabapentin (NEURONTIN) 300 MG capsule Take 1 tablet at bedtime x 1 week, then increase to 1 tablet twice daily   . lidocaine (XYLOCAINE) 2 % solution Patient: Mix 1part 2% viscous lidocaine, 1part H20. Swish and/or swallow 62mL of this mixture, 28min before meals and at bedtime, up to QID (Patient not taking: Reported on 11/15/2016)   . nicotine (NICODERM CQ - DOSED IN MG/24 HOURS) 14 mg/24hr patch Place 14 mg onto the skin daily.   . nicotine polacrilex (COMMIT) 2 MG lozenge Place inside cheek. 09/06/2016: Received from: [facility] Received Sig: Place 1 lozenge inside cheek every 2 (two) hours as needed.  . Nutritional Supplements (ENSURE COMPLETE SHAKE) LIQD Take 1 Bottle by mouth 2 (two) times daily. (Patient not taking: Reported on 11/15/2016)   . orphenadrine (NORFLEX) 100 MG tablet Take 100 mg by mouth 2 (two) times daily as needed for muscle spasms. Reported on 06/05/2016 11/05/2015: Received from: External Pharmacy  . pentoxifylline (TRENTAL) 400 MG CR tablet Take 400mg  tab daily x 1 week, then 400mg  BID; take with food (Patient not taking: Reported on 11/15/2016)   . pregabalin (LYRICA) 50 MG capsule Take 200 mg by mouth 2 (two) times daily.    . vitamin E 400 UNIT capsule Take 400 IU daily x 1 week, then 400 IU BID (Patient not taking: Reported on 11/15/2016)    No facility-administered encounter medications on file as of 11/15/2016.      Allergies:  Allergies  Allergen Reactions  . Ancef [Cefazolin] Hives  . Ciprofloxacin Hives  . Penicillin G Rash    Pt states "some kind of Penicillin. Not sure what kind" causes her to break out in rash on chest  . Wasp Venom Swelling    Facial and cranial swelling    Family History: Family History  Problem Relation Age of Onset  . Stomach cancer Sister   . Heart disease Paternal Grandfather   . Alzheimer's disease Paternal Grandmother   . Heart disease Maternal Grandmother   .  Stroke Maternal Grandmother   . Other Mother     part of intestines taken out  . Dementia Mother   . Other Father     trauma    Social History: Social History  Substance Use Topics  . Smoking status: Former Smoker    Packs/day: 0.00    Years: 32.00    Types: Cigarettes    Start date: 01/13/1986  . Smokeless tobacco: Never Used     Comment: occassional cigarette  . Alcohol use No     Comment: Sober since 2012   Social History   Social History Narrative   Lives with aunt in a one story home.  Has 3 children.     Works part time with [employer] at [employer].     Education: high school.    Review of Systems:  CONSTITUTIONAL: No fevers, chills, night sweats, or weight loss.   EYES:  No visual changes or eye pain ENT: No hearing changes.  No history of nose bleeds.   RESPIRATORY: No cough, wheezing and shortness of breath.   CARDIOVASCULAR: Negative for chest pain, and palpitations.   GI: Negative for abdominal discomfort, blood in stools or black stools.  No recent change in bowel habits.   GU:  No history of incontinence.   MUSCLOSKELETAL: +history of joint pain or swelling.  +myalgias.   SKIN: Negative for lesions, rash, and itching.   HEMATOLOGY/ONCOLOGY: Negative for prolonged bleeding, bruising easily, and swollen nodes.  No history of cancer.   ENDOCRINE: Negative for cold or heat intolerance, polydipsia or goiter.   PSYCH:  No depression or anxiety symptoms.   NEURO: As Above.   Vital Signs:  BP 110/80   Pulse 80   Ht 5\' 7"  (1.702 m)   Wt 125 lb 3 oz (56.8 kg)   SpO2 98%   BMI 19.61 kg/m   Neurological Exam: MENTAL STATUS including orientation to time, place, person, recent and remote memory, attention span and concentration, language, and fund of knowledge is normal.  Speech is not dysarthric.  Montreal Cognitive Assessment  11/15/2016  Visuospatial/ Executive (0/5) 5  Naming (0/3) 2  Attention: Read list of digits (0/2) 2  Attention: Read list of letters (0/1)  1  Attention: Serial 7 subtraction starting at 100 (0/3) 3  Language: Repeat phrase (0/2) 2  Language : Fluency (0/1) 1  Abstraction (0/2) 2  Delayed Recall (0/5) 1  Orientation (0/6) 6  Total 25  Adjusted Score (based on education) 26   CRANIAL NERVES: II:  No visual field defects.  Unremarkable fundi.   III-IV-VI: Pupils equal round and reactive to light.  Normal conjugate, extra-ocular eye movements in all directions of gaze.  No nystagmus.  No ptosis.   V:  Normal facial sensation.     VII:  Normal facial symmetry and movements.  No pathologic facial reflexes.  VIII:  Normal hearing and vestibular function.   IX-X:  Normal palatal movement.   XI:  Normal shoulder shrug and head rotation.   XII:  Normal tongue strength and range of motion, no deviation or fasciculation.  MOTOR: Motor strength with repeated encouragement is 5/5 in all extremities, proximally and distally.   No atrophy, fasciculations or abnormal movements.  No pronator drift.  Tone is normal.    MSRs:  Right                                                                 Left brachioradialis 2+  brachioradialis 2+  biceps 2+  biceps 2+  triceps 2+  triceps 2+  patellar 2+  patellar 2+  ankle jerk 2+  ankle jerk 2+  Hoffman no  Hoffman no  plantar response down  plantar response down   SENSORY:  Normal and symmetric perception of light touch, pinprick, vibration, and proprioception.  Romberg's sign absent.   COORDINATION/GAIT: Normal finger-to- nose-finger and heel-to-shin.  Intact rapid alternating movements bilaterally.  Gait narrow based and stable. Tandem and stressed gait intact.    IMPRESSION: 1.  Multifactorial cognitive impairment  - She scored 26/30 on her MOCA, missing 4 points on delayed recall and did well in all other domains  - I suspect that  most of her cognitive dysfunction is due to a combination of side effects from chemotherapy, as well as other medical comorbidities of HIV, and long  history of alcoholism which can all affect brain functioning   - With her history of cancer, I will order MRI brain wwo contrast to look for any structural pathology  - Pending the results of her MRI, the next step will be formal neurocognitive testing   - She does have a strong family history of Alzheimer's dementia, but this is very rare in her age group  - Encouraged her to start using a pillbox and discussed strategies to help with short-term memory loss  2.  Bilateral lower leg paresthesias.    - She has multiple risk factors for neuropathy including chemotherapy, HIV, hepatitis C, alcoholism; however, her neurological exam is remarkably intact  - Laboratory testing will include TSH, vitamin B12, vitamin B1 to evaluate for treatable causes of paresthesias  - NCS/EMG of the legs to evaluate for neuropathy or lumbosacral radiculopathy   3.  Scalp burning pain, nonspecific and does not seem to be associated with classic headache  - Trial of gabapentin 300mg  at bedtime x 1 week, then increase to 300mg  BID  4.  Long history of tobacco and alcohol abuse.  Commended her on quitting alcohol and tobacco and encouraged her to avoid this.   Further recommendations will be based on the results of her testing  The duration of this appointment visit was 60 minutes of face-to-face time with the patient.  Greater than 50% of this time was spent in counseling, explanation of diagnosis, planning of further management, and coordination of care.   Thank you for allowing me to participate in patient's care.  If I can answer any additional questions, I would be pleased to do so.    Sincerely,    Donika K. Posey Pronto, DO

## 2016-11-15 NOTE — Patient Instructions (Addendum)
1.  MRI brain wwo contrast 2.  NCS/EMG of the legs 3.  Check blood work 4.  Start gabapentin 300mg  at bedtime for one week, then increase to 1 tablet twice daily  We will call you with the results of the testing

## 2016-11-19 LAB — VITAMIN B1: VITAMIN B1 (THIAMINE): 16 nmol/L (ref 8–30)

## 2016-11-20 ENCOUNTER — Ambulatory Visit: Payer: Medicaid Other | Admitting: *Deleted

## 2016-11-24 ENCOUNTER — Ambulatory Visit: Payer: Medicaid Other | Admitting: *Deleted

## 2016-11-30 ENCOUNTER — Other Ambulatory Visit: Payer: Self-pay | Admitting: Neurology

## 2016-11-30 DIAGNOSIS — Z77018 Contact with and (suspected) exposure to other hazardous metals: Secondary | ICD-10-CM

## 2016-12-05 ENCOUNTER — Encounter: Payer: Medicaid Other | Admitting: Neurology

## 2016-12-05 ENCOUNTER — Other Ambulatory Visit: Payer: Self-pay | Admitting: *Deleted

## 2016-12-05 DIAGNOSIS — R200 Anesthesia of skin: Secondary | ICD-10-CM

## 2016-12-07 ENCOUNTER — Encounter: Payer: Self-pay | Admitting: Neurology

## 2016-12-08 ENCOUNTER — Other Ambulatory Visit: Payer: Medicaid Other

## 2016-12-09 ENCOUNTER — Other Ambulatory Visit: Payer: Medicaid Other

## 2016-12-14 ENCOUNTER — Other Ambulatory Visit: Payer: Self-pay | Admitting: Nurse Practitioner

## 2016-12-15 ENCOUNTER — Telehealth: Payer: Self-pay | Admitting: *Deleted

## 2016-12-15 ENCOUNTER — Encounter: Payer: Self-pay | Admitting: *Deleted

## 2016-12-15 NOTE — Telephone Encounter (Signed)
Attempted to contact patient to inform her that her MRI location had to be switched to [facility] due to her insurance.  Will mail letter to patient.

## 2016-12-19 ENCOUNTER — Telehealth: Payer: Self-pay | Admitting: *Deleted

## 2016-12-19 ENCOUNTER — Ambulatory Visit (HOSPITAL_COMMUNITY): Admission: RE | Admit: 2016-12-19 | Payer: Medicaid Other | Source: Ambulatory Visit

## 2016-12-19 NOTE — Telephone Encounter (Signed)
New PA # for [facility]: [id number]

## 2016-12-21 ENCOUNTER — Encounter: Payer: Medicaid Other | Admitting: Neurology

## 2016-12-26 ENCOUNTER — Encounter: Payer: Self-pay | Admitting: Infectious Diseases

## 2016-12-26 ENCOUNTER — Ambulatory Visit (INDEPENDENT_AMBULATORY_CARE_PROVIDER_SITE_OTHER): Payer: Medicaid Other | Admitting: Infectious Diseases

## 2016-12-26 ENCOUNTER — Other Ambulatory Visit (HOSPITAL_COMMUNITY)
Admission: RE | Admit: 2016-12-26 | Discharge: 2016-12-26 | Disposition: A | Discharge status: Home / Self Care | Payer: Medicaid Other | Source: Ambulatory Visit | Attending: Infectious Diseases | Admitting: Infectious Diseases

## 2016-12-26 VITALS — BP 111/73 | HR 87 | Temp 98.0°F | Ht 67.0 in | Wt 124.8 lb

## 2016-12-26 DIAGNOSIS — Z79899 Other long term (current) drug therapy: Secondary | ICD-10-CM

## 2016-12-26 DIAGNOSIS — B20 Human immunodeficiency virus [HIV] disease: Secondary | ICD-10-CM | POA: Diagnosis present

## 2016-12-26 DIAGNOSIS — B182 Chronic viral hepatitis C: Secondary | ICD-10-CM | POA: Diagnosis not present

## 2016-12-26 DIAGNOSIS — Z113 Encounter for screening for infections with a predominantly sexual mode of transmission: Secondary | ICD-10-CM | POA: Insufficient documentation

## 2016-12-26 LAB — COMPREHENSIVE METABOLIC PANEL
ALK PHOS: 72 U/L (ref 33–130)
ALT: 11 U/L (ref 6–29)
AST: 15 U/L (ref 10–35)
Albumin: 4 g/dL (ref 3.6–5.1)
BILIRUBIN TOTAL: 0.3 mg/dL (ref 0.2–1.2)
BUN: 13 mg/dL (ref 7–25)
CO2: 29 mmol/L (ref 20–31)
CREATININE: 0.74 mg/dL (ref 0.50–1.05)
Calcium: 9.7 mg/dL (ref 8.6–10.4)
Chloride: 99 mmol/L (ref 98–110)
GLUCOSE: 76 mg/dL (ref 65–99)
POTASSIUM: 4.6 mmol/L (ref 3.5–5.3)
SODIUM: 136 mmol/L (ref 135–146)
TOTAL PROTEIN: 7.1 g/dL (ref 6.1–8.1)

## 2016-12-26 LAB — CBC
HCT: 40.3 % (ref 35.0–45.0)
Hemoglobin: 13.9 g/dL (ref 11.7–15.5)
MCH: 32.8 pg (ref 27.0–33.0)
MCHC: 34.5 g/dL (ref 32.0–36.0)
MCV: 95 fL (ref 80.0–100.0)
MPV: 9.8 fL (ref 7.5–12.5)
PLATELETS: 235 10*3/uL (ref 140–400)
RBC: 4.24 MIL/uL (ref 3.80–5.10)
RDW: 13.3 % (ref 11.0–15.0)
WBC: 6.4 10*3/uL (ref 3.8–10.8)

## 2016-12-26 LAB — LIPID PANEL
CHOL/HDL RATIO: 4.6 ratio (ref <5.0)
CHOLESTEROL: 229 mg/dL — AB (ref <200)
HDL: 50 mg/dL — ABNORMAL LOW (ref >50)
LDL Cholesterol: 145 mg/dL — ABNORMAL HIGH (ref <100)
Triglycerides: 170 mg/dL — ABNORMAL HIGH (ref <150)
VLDL: 34 mg/dL — ABNORMAL HIGH (ref <30)

## 2016-12-26 NOTE — Progress Notes (Signed)
   Subjective:    Patient ID: [name], female    DOB: [birth year], 51 y.o.   MRN: [id number]  HPI 51 yo F with hx of T3 N2 supraglottic cancer. She had completed XRT (10-2015) for this however she has resumed XRT Jan 2018. She is to have repeat bx 01-05-17.  She is also HIV+ since 1999, and has been followed at [facility]- on complera --> odefsy.  She also has a hx of Hep C. Her Hep C Vl was negative 12-2014 nad 02-2016. She also has a hx of ETOH abuse. Has been clean for 4 years.   Was dx with "pneumonia" at [facility] and is on po anbx. Worried she had shingles on her scalp, was given ~ 10 days of Valtrex.  Has some occas problems with diarrhea from [facility]. Also having some joint pains.  Denies taking H2 blockers.   HIV 1 RNA Quant (copies/mL)  Date Value  06/05/2016 444 (H)  12/23/2015 <20   CD4 T Cell Abs (/uL)  Date Value  06/05/2016 360 (L)  12/23/2015 140 (L)    Review of Systems  Constitutional: Negative for appetite change and unexpected weight change.  HENT: Positive for mouth sores.   Gastrointestinal: Negative for constipation and diarrhea.  Genitourinary: Negative for difficulty urinating.  has to chop her food up real, real fine. Making smoothies as well. Taking MVI.      Objective:   Physical Exam  Constitutional: She appears well-developed and well-nourished.  HENT:  Mouth/Throat: No oropharyngeal exudate.  Eyes: EOM are normal. Pupils are equal, round, and reactive to light.  Neck: Neck supple.  Cardiovascular: Normal rate, regular rhythm and normal heart sounds.   Pulmonary/Chest: Effort normal and breath sounds normal.  Abdominal: Soft. Bowel sounds are normal. There is no tenderness. There is no rebound.  Musculoskeletal: She exhibits no edema.  Lymphadenopathy:    She has no cervical adenopathy.      Assessment & Plan:

## 2016-12-26 NOTE — Assessment & Plan Note (Signed)
She denies this.  She denies hx of treatment.  Will check fibrosure, liver u/s provided she does well with CA tx.

## 2016-12-26 NOTE — Assessment & Plan Note (Signed)
She appears to be doing well Will repeat her CD4 and VL today She has had flu shot.  She needs mammo, Pap.  rtc in 6 months

## 2016-12-27 LAB — T-HELPER CELL (CD4) - (RCID CLINIC ONLY)
CD4 % Helper T Cell: 35 % (ref 33–55)
CD4 T Cell Abs: 600 /uL (ref 400–2700)

## 2016-12-27 LAB — URINE CYTOLOGY ANCILLARY ONLY
CHLAMYDIA, DNA PROBE: NEGATIVE
NEISSERIA GONORRHEA: NEGATIVE

## 2016-12-27 LAB — RPR

## 2016-12-28 ENCOUNTER — Ambulatory Visit (INDEPENDENT_AMBULATORY_CARE_PROVIDER_SITE_OTHER): Payer: Medicaid Other | Admitting: Neurology

## 2016-12-28 ENCOUNTER — Telehealth: Payer: Self-pay | Admitting: *Deleted

## 2016-12-28 DIAGNOSIS — R202 Paresthesia of skin: Secondary | ICD-10-CM

## 2016-12-28 DIAGNOSIS — R200 Anesthesia of skin: Secondary | ICD-10-CM

## 2016-12-28 NOTE — Telephone Encounter (Signed)
-----   Message from Alda Berthold, DO sent at 12/28/2016 12:41 PM EST ----- Please inform patient that her nerve testing is normal and does not show any evidence of neuropathy.  Please also give her the number to call to reschedule her MRI brain. Thanks

## 2016-12-28 NOTE — Telephone Encounter (Signed)
Patient given results and phone number.

## 2016-12-28 NOTE — Procedures (Signed)
[facility]  [address], [address]  [place], [place] [address] Tel: [phone] Fax:  [phone] Test Date:  12/28/2016  Patient: [name] DOB: [birth year] Physician: Narda Amber, DO  Sex: Female Height: 5\' 7"  Ref Phys: Narda Amber, DO  ID#: [id number] Temp: 33.8C Technician: Jerilynn Mages. Dean   Patient Complaints: This is a 51 year old female with history of HIV, hepatitis C, alcoholism, and laryngeal squamous cell carcinoma s/p chemotherapy referred for evaluation of bilateral leg pain and paresthesias.   NCV & EMG Findings: Extensive electrodiagnostic testing of the right lower extremity and additional studies of the left shows:  1. Bilateral sural and superficial peroneal sensory responses are within normal limits. 2. Bilateral peroneal and tibial motor responses are within normal limits. 3. Bilateral tibial H reflex studies are within normal limits. 4. There is no evidence of active or chronic motor axon loss changes affecting the tested muscles. Motor unit configuration and recruitment pattern is within normal limits.  Impression: This is a normal study of the lower extremities. In particular, there is no evidence of a generalized sensorimotor polyneuropathy or lumbosacral radiculopathy.   ___________________________ Narda Amber, DO    Nerve Conduction Studies Anti Sensory Summary Table   Stim Site NR Peak (ms) Norm Peak (ms) P-T Amp (V) Norm P-T Amp  Left Sup Peroneal Anti Sensory (Ant Lat Mall)  33.8C  12 cm    3.0 <4.6 4.8 >4  Right Sup Peroneal Anti Sensory (Ant Lat Mall)  33.8C  12 cm    3.3 <4.6 5.8 >4  Left Sural Anti Sensory (Lat Mall)  33.8C  Calf    3.5 <4.6 13.5 >4  Right Sural Anti Sensory (Lat Mall)  33.8C  Calf    3.4 <4.6 13.6 >4   Motor Summary Table   Stim Site NR Onset (ms) Norm Onset (ms) O-P Amp (mV) Norm O-P Amp Site1 Site2 Delta-0 (ms) Dist (cm) Vel (m/s) Norm Vel (m/s)  Left Peroneal Motor (Ext Dig Brev)  33.8C  Ankle     3.4 <6.0 3.9 >2.5 B Fib Ankle 7.4 34.0 46 >40  B Fib    10.8  3.2  Poplt B Fib 2.0 10.0 50 >40  Poplt    12.8  3.1         Right Peroneal Motor (Ext Dig Brev)  33.8C  Ankle    3.1 <6.0 4.3 >2.5 B Fib Ankle 7.1 33.0 46 >40  B Fib    10.2  3.5  Poplt B Fib 2.0 10.0 50 >40  Poplt    12.2  3.5         Left Tibial Motor (Abd Hall Brev)  33.8C  Ankle    2.9 <6.0 13.3 >4 Knee Ankle 8.2 38.0 46 >40  Knee    11.1  9.7         Right Tibial Motor (Abd Hall Brev)  33.8C  Ankle    2.5 <6.0 13.4 >4 Knee Ankle 8.4 39.0 46 >40  Knee    10.9  8.2          H Reflex Studies   NR H-Lat (ms) Lat Norm (ms) L-R H-Lat (ms) M-Lat (ms) HLat-MLat (ms)  Left Tibial (Gastroc)  33.8C     34.42 <35 0.14 5.31 29.11  Right Tibial (Gastroc)  33.8C     34.56 <35 0.14 5.31 29.25   EMG   Side Muscle Ins Act Fibs Psw Fasc Number Recrt Dur Dur. Amp Amp. Poly Poly. Comment  Right  AntTibialis Nml Nml Nml Nml Nml Nml Nml Nml Nml Nml Nml Nml N/A  Right Gastroc Nml Nml Nml Nml Nml Nml Nml Nml Nml Nml Nml Nml N/A  Right Flex Dig Long Nml Nml Nml Nml Nml Nml Nml Nml Nml Nml Nml Nml N/A  Right RectFemoris Nml Nml Nml Nml Nml Nml Nml Nml Nml Nml Nml Nml N/A  Right GluteusMed Nml Nml Nml Nml Nml Nml Nml Nml Nml Nml Nml Nml N/A  Right BicepsFemS Nml Nml Nml Nml Nml Nml Nml Nml Nml Nml Nml Nml N/A  Left AntTibialis Nml Nml Nml Nml Nml Nml Nml Nml Nml Nml Nml Nml N/A  Left Gastroc Nml Nml Nml Nml Nml Nml Nml Nml Nml Nml Nml Nml N/A  Left RectFemoris Nml Nml Nml Nml Nml Nml Nml Nml Nml Nml Nml Nml N/A      Waveforms:

## 2016-12-29 LAB — HIV-1 RNA,QN PCR W/REFLEX GENOTYPE

## 2017-01-10 ENCOUNTER — Telehealth: Payer: Self-pay | Admitting: Adult Health

## 2017-01-10 NOTE — Telephone Encounter (Signed)
Called patient twice, both times the call was answered, however disconnected.  I was attempting to reach patient because it appears she may have transferred care to [facility], and I wanted to clarify this in regards to her upcoming appointment.    Charlestine Massed, NP

## 2017-01-11 ENCOUNTER — Telehealth: Payer: Self-pay | Admitting: Adult Health

## 2017-01-11 NOTE — Telephone Encounter (Signed)
[facility] requesting that patient to return my call about her appointment tomorrow for clarification.    Charlestine Massed, NP

## 2017-01-12 ENCOUNTER — Ambulatory Visit: Payer: Medicaid Other

## 2017-01-12 ENCOUNTER — Encounter: Payer: Medicaid Other | Admitting: Adult Health

## 2017-02-27 IMAGING — CR DG CHEST 2V
2 series · 2 of 2 positions shown · non-contrast
Comparison: 11/11/2014

CLINICAL DATA: Cough and sore throat today.

EXAM:
CHEST  2 VIEW

[chest pa]
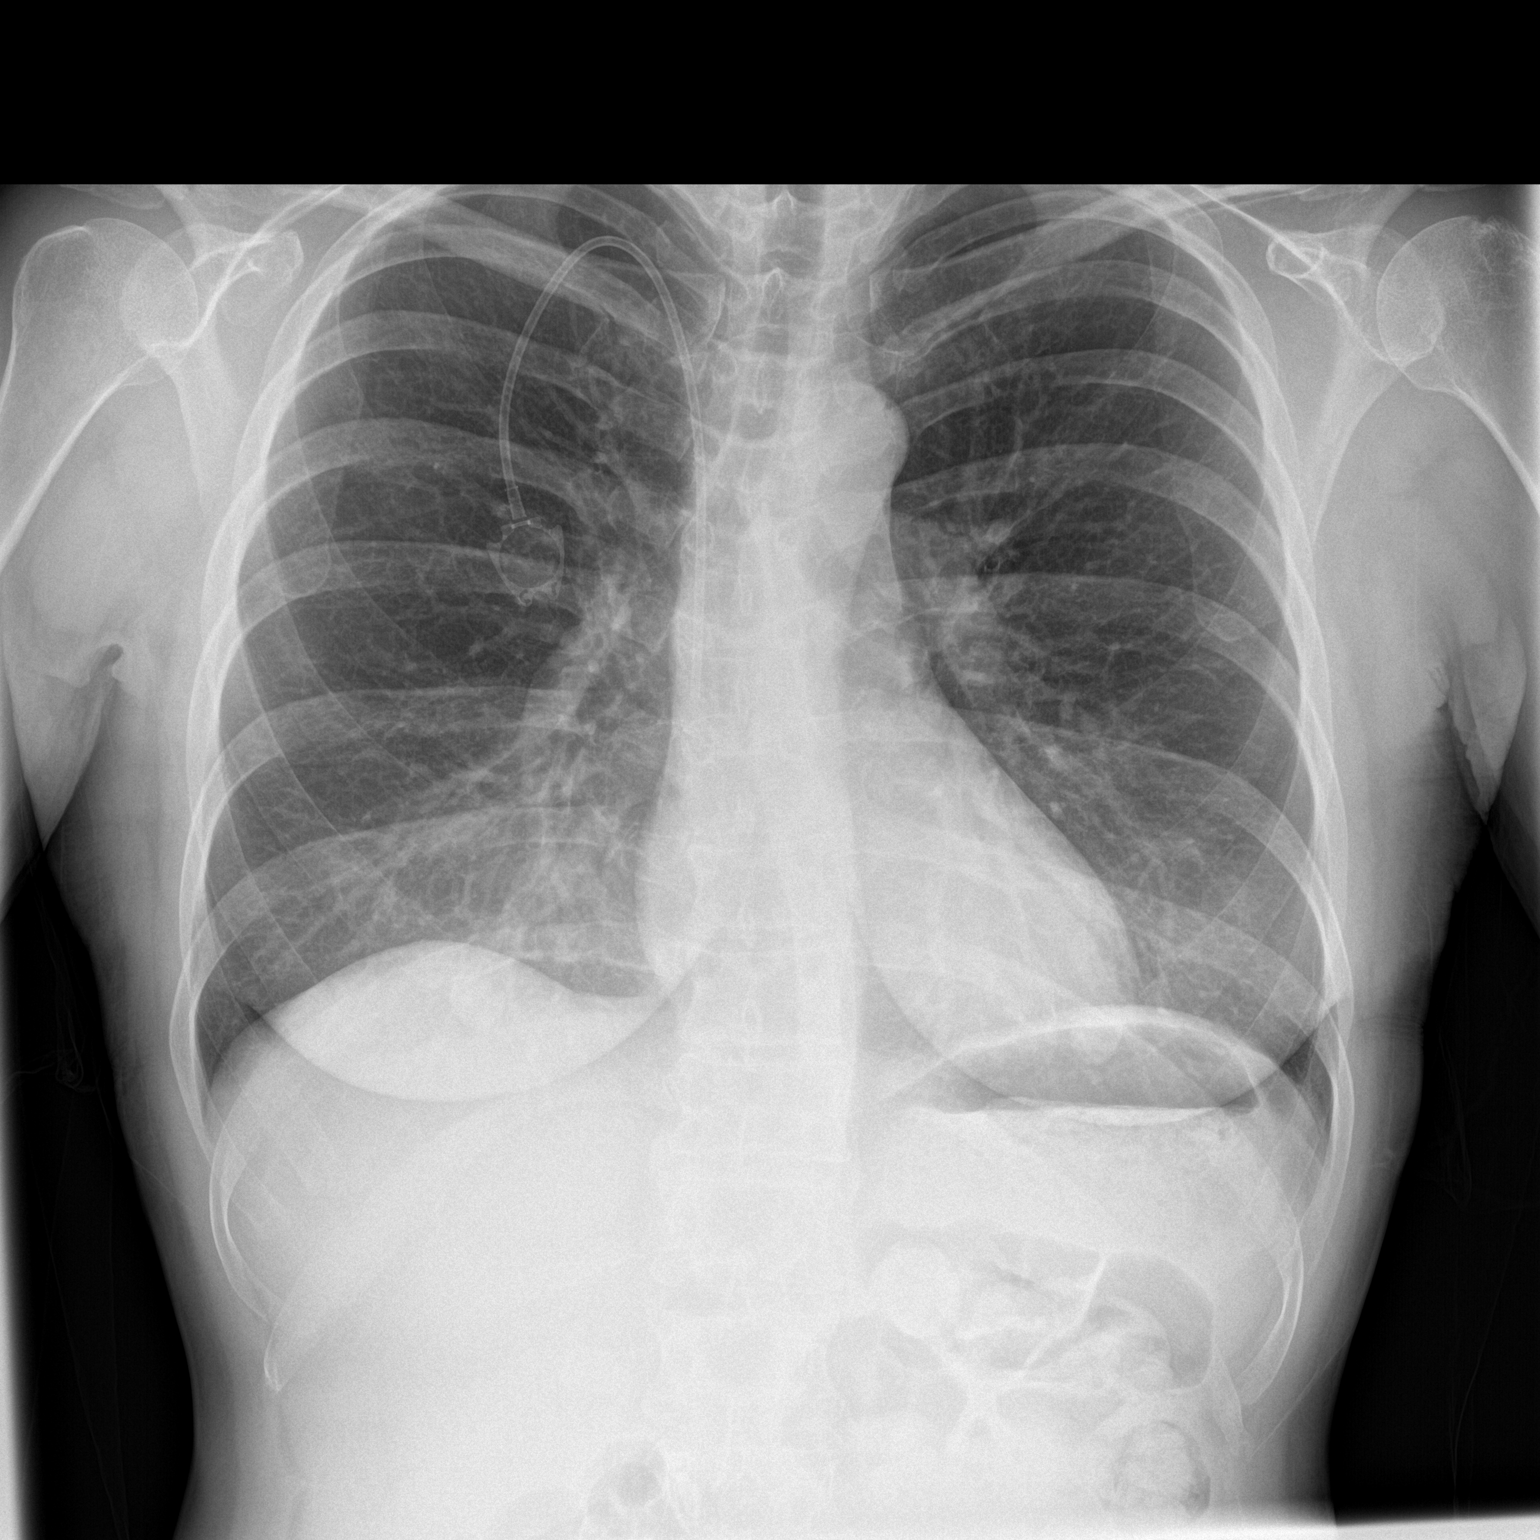

[chest lat]
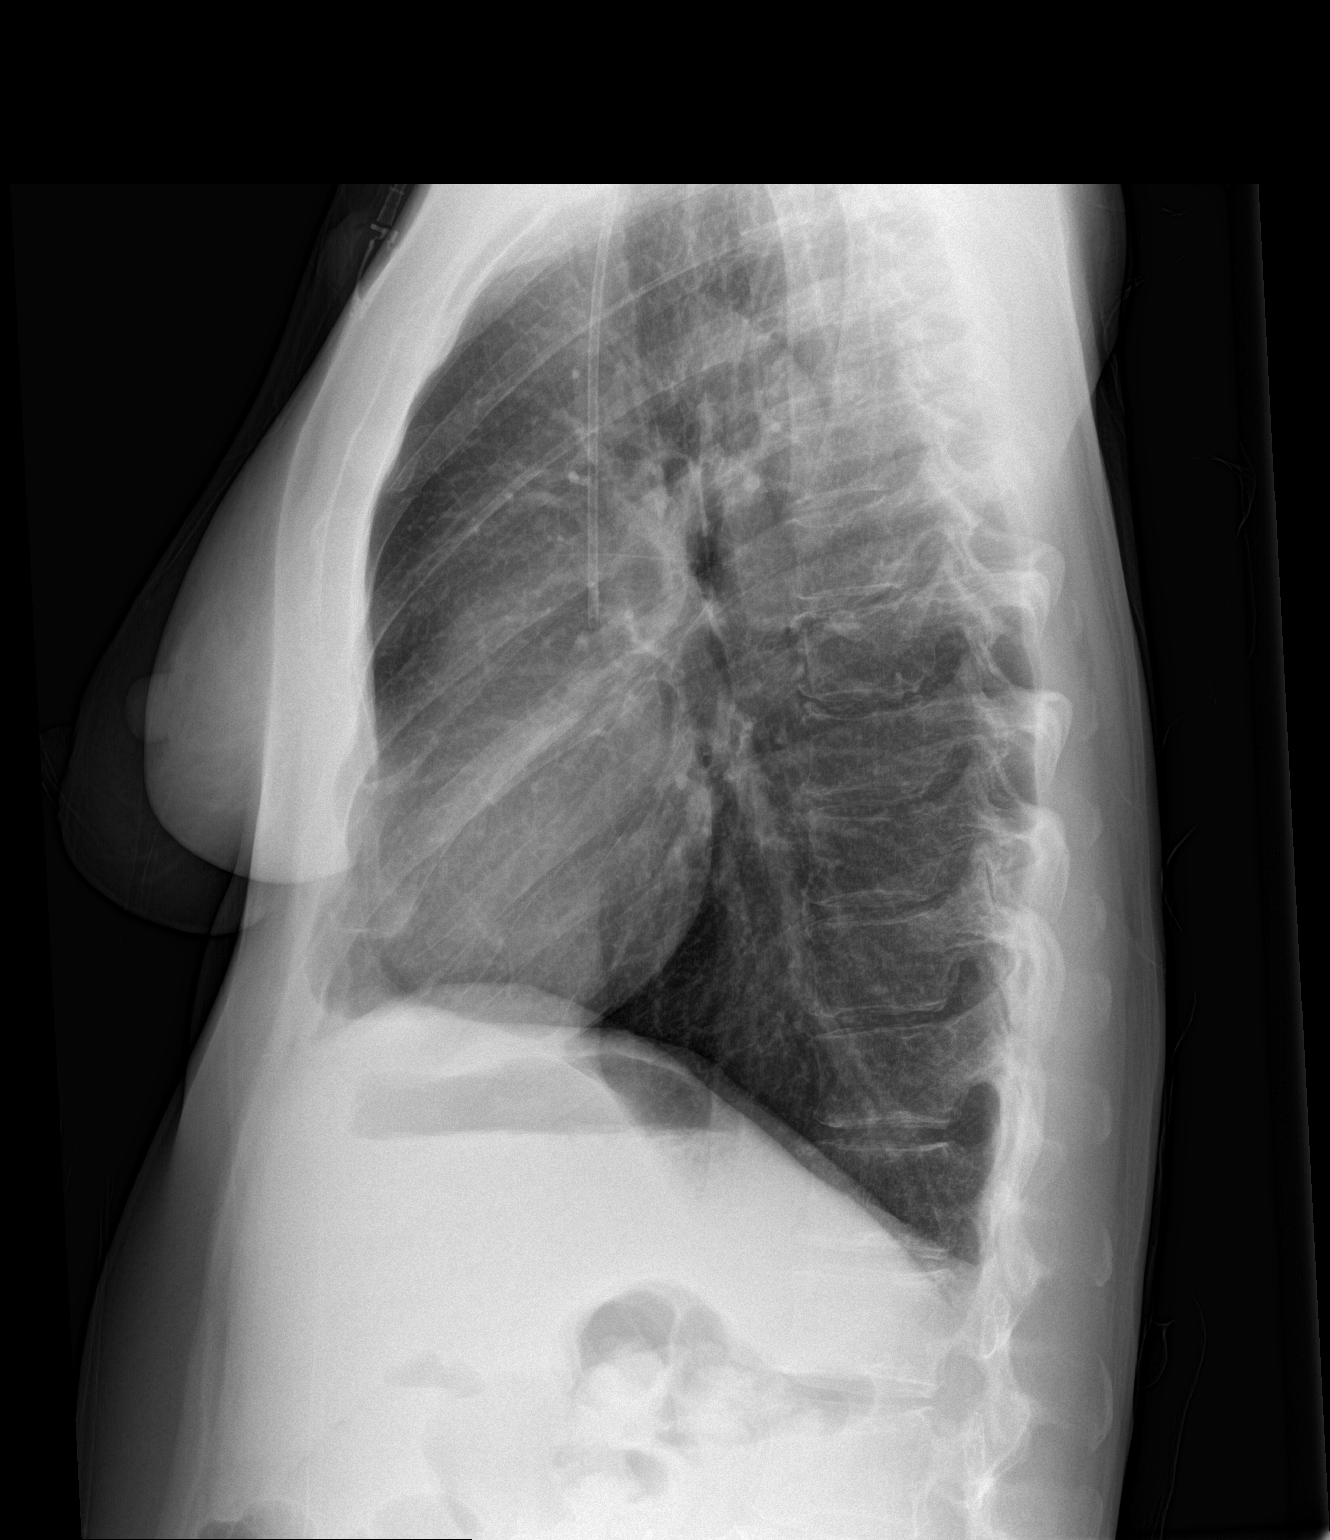

[2 of 2 positions shown; findings below may reference images not displayed]

FINDINGS: Tip of the right chest port in the mid SVC. The cardiomediastinal
contours are normal. The lungs are clear. Pulmonary vasculature is
normal. No consolidation, pleural effusion, or pneumothorax. No
acute osseous abnormalities are seen.
IMPRESSION: No acute pulmonary process.

## 2017-03-01 ENCOUNTER — Encounter: Payer: Self-pay | Admitting: *Deleted

## 2017-03-01 NOTE — Progress Notes (Signed)
[place] Work  Clinical Social Work was referred by patient for information about support groups. Pt interested in survivor group for H&N patients.  Clinical Social Worker educated pt about H&N [facility] and other options through [facility]. Pt registered for [facility] and [facility] will mail information accordingly. 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 Pt in good spirits and upbeat mood throughout phone call. Pt aware to reach out for other needs.    Clinical Social Work interventions:  Program education and referral  Loren Racer, LCSW, OSW-C Clinical Social Worker [place]  [place] Phone: [phone] Fax: [phone]

## 2017-03-30 ENCOUNTER — Other Ambulatory Visit: Payer: Self-pay | Admitting: *Deleted

## 2017-03-30 ENCOUNTER — Telehealth: Payer: Self-pay | Admitting: Neurology

## 2017-03-30 MED ORDER — GABAPENTIN 300 MG PO CAPS: ORAL_CAPSULE | ORAL | 5 refills | Status: DC

## 2017-03-30 NOTE — Telephone Encounter (Signed)
Okay to increase gabapentin to 300 mg 3 times daily

## 2017-03-30 NOTE — Telephone Encounter (Signed)
Received a call from patient regarding medication: gabapentin.  Patient needs a refill of medication. yes    Patient is taking 3 a day instead of 2 as written.  Needs refill as she is out and would like it written for 3 a day.

## 2017-03-30 NOTE — Telephone Encounter (Signed)
Your last note says to take bid.  Do you want to increase it to tid?

## 2017-03-30 NOTE — Telephone Encounter (Signed)
Rx sent 

## 2017-04-04 ENCOUNTER — Emergency Department (HOSPITAL_COMMUNITY): Payer: Medicaid Other

## 2017-04-04 ENCOUNTER — Encounter (HOSPITAL_COMMUNITY): Payer: Self-pay | Admitting: *Deleted

## 2017-04-04 ENCOUNTER — Emergency Department (HOSPITAL_COMMUNITY)
Admission: EM | Admit: 2017-04-04 | Discharge: 2017-04-04 | Disposition: A | Discharge status: Home / Self Care | Payer: Medicaid Other | Attending: Emergency Medicine | Admitting: Emergency Medicine

## 2017-04-04 DIAGNOSIS — Z87891 Personal history of nicotine dependence: Secondary | ICD-10-CM | POA: Insufficient documentation

## 2017-04-04 DIAGNOSIS — R1030 Lower abdominal pain, unspecified: Secondary | ICD-10-CM | POA: Diagnosis present

## 2017-04-04 DIAGNOSIS — Z8521 Personal history of malignant neoplasm of larynx: Secondary | ICD-10-CM | POA: Insufficient documentation

## 2017-04-04 LAB — LIPASE, BLOOD: Lipase: 24 U/L (ref 11–51)

## 2017-04-04 LAB — URINALYSIS, ROUTINE W REFLEX MICROSCOPIC
BILIRUBIN URINE: NEGATIVE
Glucose, UA: NEGATIVE mg/dL
HGB URINE DIPSTICK: NEGATIVE
KETONES UR: NEGATIVE mg/dL
NITRITE: NEGATIVE
Protein, ur: NEGATIVE mg/dL
Specific Gravity, Urine: 1.006 (ref 1.005–1.030)
pH: 5 (ref 5.0–8.0)

## 2017-04-04 LAB — COMPREHENSIVE METABOLIC PANEL
ALT: 16 U/L (ref 14–54)
ANION GAP: 10 (ref 5–15)
AST: 23 U/L (ref 15–41)
Albumin: 4.2 g/dL (ref 3.5–5.0)
Alkaline Phosphatase: 64 U/L (ref 38–126)
BILIRUBIN TOTAL: 0.2 mg/dL — AB (ref 0.3–1.2)
BUN: 9 mg/dL (ref 6–20)
CO2: 21 mmol/L — ABNORMAL LOW (ref 22–32)
Calcium: 9.7 mg/dL (ref 8.9–10.3)
Chloride: 104 mmol/L (ref 101–111)
Creatinine, Ser: 0.73 mg/dL (ref 0.44–1.00)
GFR calc Af Amer: >60 mL/min (ref >60)
Glucose, Bld: 105 mg/dL — ABNORMAL HIGH (ref 65–99)
POTASSIUM: 4 mmol/L (ref 3.5–5.1)
Sodium: 135 mmol/L (ref 135–145)
TOTAL PROTEIN: 6.8 g/dL (ref 6.5–8.1)

## 2017-04-04 LAB — CBC
HEMATOCRIT: 38.5 % (ref 36.0–46.0)
HEMOGLOBIN: 13.1 g/dL (ref 12.0–15.0)
MCH: 31.3 pg (ref 26.0–34.0)
MCHC: 34 g/dL (ref 30.0–36.0)
MCV: 92.1 fL (ref 78.0–100.0)
Platelets: 223 10*3/uL (ref 150–400)
RBC: 4.18 MIL/uL (ref 3.87–5.11)
RDW: 12.4 % (ref 11.5–15.5)
WBC: 5 10*3/uL (ref 4.0–10.5)

## 2017-04-04 MED ORDER — IOPAMIDOL (ISOVUE-300) INJECTION 61%
INTRAVENOUS | Status: AC
  Administered 2017-04-04: 100 mL
  Filled 2017-04-04: qty 100

## 2017-04-04 NOTE — ED Triage Notes (Signed)
PT states she got up to go lay beside grand baby and had awful pain in lower stomach and reports pain in her lower back.  Pt denies constipation, pt reports stated having 4-5 small poops per day over the last 2 days and reports bad pain. Tender to RLQ

## 2017-04-04 NOTE — ED Provider Notes (Signed)
[facility] DEPT Provider Note   CSN: [id number] Arrival date & time: 04/04/17  1045     History   Chief Complaint Chief Complaint  Patient presents with  . Abdominal Pain  . Back Pain    HPI [name] is a 51 y.o. female.  HPI  Pt presenting with c/o diffuse lower abdominal pain as well as bilateral low back pain.  Pt states that low back pain began a few days ago and lower abdominal pain began last night whiile lying in bed.  Pain is waxing and waning in nature.  Reported as dull and aching.  No vomiting.  She had several small hard bowel movements associated with the pain as well.  No fever/chills.  No blood in stool.  No dysuria/urgency or frequency.  She has taken oxycodone at home trying to relieve pain last night without much relief.  There are no other associated systemic symptoms, there are no other alleviating or modifying factors.   Past Medical History:  Diagnosis Date  . Alcohol abuse   . Anemia   . Anxiety   . 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 HCV (hepatitis C virus)    chronic , RNA negative 2016, never treated  . HIV (human immunodeficiency virus infection) [facility])     Patient Active Problem List   Diagnosis Date Noted  . DOE (dyspnea on exertion) 06/05/2016  . HIV disease ([place]) 03/08/2016  . Hepatitis C without hepatic coma 03/08/2016  . Throat pain in adult 11/23/2015  . 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 CESAREAN SECTION     x 3  . LARYNGOSCOPY  12/31/14    OB History    No data available       Home Medications    Prior to Admission medications   Medication Sig Start Date End Date Taking? Authorizing Provider  albuterol (PROVENTIL HFA;VENTOLIN HFA) 108 (90 Base) MCG/ACT inhaler Inhale 2 puffs into the lungs every 6 (six) hours as needed for wheezing or shortness of breath. 06/05/16   Campbell Riches,  MD  ascorbic acid (VITAMIN C) 500 MG tablet Take 500 mg by mouth daily.    Historical Provider, MD  B Complex Vitamins (VITAMIN-B COMPLEX) TABS Take by mouth.    Historical Provider, MD  diclofenac sodium (VOLTAREN) 1 % GEL Apply 2 g topically as needed (for pain).  06/03/15   Historical Provider, MD  emtricitabine-rilpivir-tenofovir AF (ODEFSEY) 200-25-25 MG TABS tablet Take 1 tablet by mouth daily with breakfast. 06/05/16   Campbell Riches, MD  gabapentin (NEURONTIN) 300 MG capsule Take one capsule 3 x a day. 03/30/17   Donika K Patel, DO  ibuprofen (ADVIL,MOTRIN) 200 MG tablet Take 600 mg by mouth every 6 (six) hours as needed for fever or moderate pain. Reported on 06/05/2016    Historical Provider, MD  lidocaine (XYLOCAINE) 2 % solution Patient: Mix 1part 2% viscous lidocaine, 1part H20. Swish and/or swallow 22mL of this mixture, 62min before meals and at bedtime, up to QID 10/08/15   Holley Bouche, NP  nicotine (NICODERM CQ - DOSED IN MG/24 HOURS) 14 mg/24hr patch Place 14 mg onto the skin daily.    Historical Provider, MD  nicotine polacrilex (COMMIT) 2 MG lozenge Place inside cheek. 07/24/16   Historical Provider, MD  Nutritional Supplements (ENSURE COMPLETE SHAKE) LIQD Take 1 Bottle by mouth 2 (two)  times daily. Patient not taking: Reported on 12/26/2016 08/27/15   Volanda Napoleon, MD  orphenadrine (NORFLEX) 100 MG tablet Take 100 mg by mouth 2 (two) times daily as needed for muscle spasms. Reported on 06/05/2016 10/31/15   Historical Provider, MD  oxyCODONE (ROXICODONE) 5 MG immediate release tablet Take 5 mg by mouth every 6 (six) hours as needed for severe pain.  02/23/16   Historical Provider, MD  pentoxifylline (TRENTAL) 400 MG CR tablet Take 400mg  tab daily x 1 week, then 400mg  BID; take with food 09/06/16   Eppie Gibson, MD  pregabalin (LYRICA) 50 MG capsule Take 200 mg by mouth 2 (two) times daily.  06/03/15 12/26/16  Historical Provider, MD  vitamin E 400 UNIT capsule Take 400 IU daily x 1  week, then 400 IU BID Patient not taking: Reported on 12/26/2016 09/06/16   Eppie Gibson, MD    Family History Family History  Problem Relation Age of Onset  . Stomach cancer Sister   . Heart disease Paternal Grandfather   . Alzheimer's disease Paternal Grandmother   . Heart disease Maternal Grandmother   . Stroke Maternal Grandmother   . Other Mother     part of intestines taken out  . Dementia Mother   . Other Father     trauma    Social History Social History  Substance Use Topics  . Smoking status: Former Smoker    Packs/day: 0.00    Years: 32.00    Types: Cigarettes    Start date: 01/13/1986  . Smokeless tobacco: Never Used     Comment: occassional cigarette  . Alcohol use No     Comment: Sober since 2012     Allergies   Ancef [cefazolin]; Ciprofloxacin; Penicillin g; and Wasp venom   Review of Systems Review of Systems  ROS reviewed and all otherwise negative except for mentioned in HPI   Physical Exam Updated Vital Signs BP 110/70   Pulse 64   Temp 97.3 F (36.3 C) (Oral)   Resp 18   SpO2 98%  Vitals reviewed Physical Exam Physical Examination: General appearance - alert, well appearing, and in no distress Mental status - alert, oriented to person, place, and time Eyes - no conjunctival injection no scleral icterus Chest - clear to auscultation, no wheezes, rales or rhonchi, symmetric air entry Heart - normal rate, regular rhythm, normal S1, S2, no murmurs, rubs, clicks or gallops Abdomen - soft, ttp over right and left lower abdomen, no gaurding or rebound tenderness, nabs, nondistended, no masses or organomegaly Neurological - alert, oriented, normal speech Extremities - peripheral pulses normal, no pedal edema, no clubbing or cyanosis Skin - normal coloration and turgor, no rashes  ED Treatments / Results  Labs (all labs ordered are listed, but only abnormal results are displayed) Labs Reviewed  COMPREHENSIVE METABOLIC PANEL - Abnormal; Notable  for the following:       Result Value   CO2 21 (*)    Glucose, Bld 105 (*)    Total Bilirubin 0.2 (*)    All other components within normal limits  URINALYSIS, ROUTINE W REFLEX MICROSCOPIC - Abnormal; Notable for the following:    APPearance HAZY (*)    Leukocytes, UA SMALL (*)    Bacteria, UA RARE (*)    Squamous Epithelial / LPF 6-30 (*)    All other components within normal limits  LIPASE, BLOOD  CBC    EKG  EKG Interpretation None       Radiology Ct  Abdomen Pelvis W Contrast  Result Date: 04/04/2017 CLINICAL DATA:  Lower abdominal pain since 2 o'clock this morning. EXAM: CT ABDOMEN AND PELVIS WITH CONTRAST TECHNIQUE: Multidetector CT imaging of the abdomen and pelvis was performed using the standard protocol following bolus administration of intravenous contrast. CONTRAST:  100 cc Isovue-300. COMPARISON:  08/16/2016. FINDINGS: Lower chest: Lung bases are clear. Heart size normal. No pericardial or pleural effusion. Hepatobiliary: Liver and gallbladder are unremarkable. No biliary ductal dilatation. Pancreas: Negative. Spleen: Negative. Adrenals/Urinary Tract: Adrenal glands and kidneys are unremarkable. Right ureter is mildly dilated but without cause identified and is unchanged from 08/16/2016. Left ureter is decompressed. Bladder is grossly unremarkable. Stomach/Bowel: Stomach, small bowel, appendix and colon are unremarkable. Vascular/Lymphatic: Atherosclerotic calcification of the arterial vasculature without abdominal aortic aneurysm. No pathologically enlarged lymph nodes. Reproductive: Uterus is visualized. Probable 13 mm fibroid in the right lateral uterine fundus. No adnexal mass. Other: Trace pelvic free fluid. Mesenteries and peritoneum are otherwise unremarkable. Musculoskeletal: No worrisome lytic or sclerotic lesions. Degenerative changes are seen in the spine. IMPRESSION: 1. No findings to explain the patient's pain. Specifically, no evidence of appendicitis or  diverticulitis. Mild right ureteral dilatation is unchanged from 08/16/2016. 2.  Aortic atherosclerosis (ICD10-170.0). Electronically Signed   By: Lorin Picket M.D.   On: 04/04/2017 14:27    Procedures Procedures (including critical care time)  Medications Ordered in ED Medications  iopamidol (ISOVUE-300) 61 % injection (100 mLs  Contrast Given 04/04/17 1401)     Initial Impression / Assessment and Plan / ED Course  I have reviewed the triage vital signs and the nursing notes.  Pertinent labs & imaging results that were available during my care of the patient were reviewed by me and considered in my medical decision making (see chart for details).     Pt presenting with c/o lower abdominal pain as well as low back pain, abdominal pain began last night- labs are reassuring.  Abdominal CT scan obtained and was negative.  All results d/w patient.  Advised f/u with PMD.  Discharged with strict return precautions.  Pt agreeable with plan.  Final Clinical Impressions(s) / ED Diagnoses   Final diagnoses:  Lower abdominal pain    New Prescriptions Discharge Medication List as of 04/04/2017  4:11 PM       Alfonzo Beers, MD 04/04/17 1807

## 2017-04-04 NOTE — Discharge Instructions (Signed)
Return to the ED with any concerns including vomiting and not able to keep down liquids or your medications, abdominal pain especially if it localizes to the right lower abdomen, fever or chills, and decreased urine output, decreased level of alertness or lethargy, or any other alarming symptoms.  °

## 2017-04-04 NOTE — ED Notes (Signed)
Patient transported to CT 

## 2017-07-17 ENCOUNTER — Emergency Department (HOSPITAL_COMMUNITY)
Admission: EM | Admit: 2017-07-17 | Discharge: 2017-07-18 | Disposition: A | Discharge status: Home / Self Care | Payer: Medicaid Other | Attending: Emergency Medicine | Admitting: Emergency Medicine

## 2017-07-17 DIAGNOSIS — R51 Headache: Secondary | ICD-10-CM | POA: Diagnosis present

## 2017-07-17 DIAGNOSIS — G4489 Other headache syndrome: Secondary | ICD-10-CM | POA: Diagnosis not present

## 2017-07-17 DIAGNOSIS — Z87891 Personal history of nicotine dependence: Secondary | ICD-10-CM | POA: Insufficient documentation

## 2017-07-17 DIAGNOSIS — Z79899 Other long term (current) drug therapy: Secondary | ICD-10-CM | POA: Insufficient documentation

## 2017-07-17 DIAGNOSIS — Z8521 Personal history of malignant neoplasm of larynx: Secondary | ICD-10-CM | POA: Insufficient documentation

## 2017-07-17 NOTE — ED Triage Notes (Signed)
Pt c/o right sided headache for the last 4-5 weeks has followed up with primary care doctor but neuro referral isn't for another month.

## 2017-07-18 ENCOUNTER — Telehealth: Payer: Self-pay | Admitting: *Deleted

## 2017-07-18 MED ORDER — METOCLOPRAMIDE HCL 5 MG/ML IJ SOLN
10.0000 mg | Freq: Once | INTRAMUSCULAR | Status: AC
  Administered 2017-07-18: 10 mg via INTRAVENOUS
  Filled 2017-07-18: qty 2

## 2017-07-18 MED ORDER — DIPHENHYDRAMINE HCL 50 MG/ML IJ SOLN
25.0000 mg | Freq: Once | INTRAMUSCULAR | Status: AC
  Administered 2017-07-18: 25 mg via INTRAVENOUS
  Filled 2017-07-18: qty 1

## 2017-07-18 MED ORDER — KETOROLAC TROMETHAMINE 30 MG/ML IJ SOLN
15.0000 mg | Freq: Once | INTRAMUSCULAR | Status: AC
  Administered 2017-07-18: 15 mg via INTRAVENOUS
  Filled 2017-07-18: qty 1

## 2017-07-18 NOTE — Discharge Instructions (Signed)

## 2017-07-18 NOTE — Telephone Encounter (Signed)
Patient called stating that she has a knot on her head.  She went to ER and they treated her for a migraine.  She has an appointment this month but would like to be seen sooner.  Informed her that I will have her put on our waiting list.

## 2017-07-18 NOTE — ED Provider Notes (Signed)
[facility] DEPT Provider Note   CSN: [id number] Arrival date & time: 07/17/17  1623     History   Chief Complaint Chief Complaint  Patient presents with  . Headache    HPI [name] is a 51 y.o. female.  The history is provided by the patient.  Headache   This is a new problem. The current episode started 2 days ago. The problem occurs constantly. The problem has been gradually worsening. The pain is located in the right unilateral region. The quality of the pain is described as sharp. The pain is moderate. Radiates to: right ear. Pertinent negatives include no fever, no shortness of breath and no vomiting.  Patient presents with HA It is right sided She reports a "knot" on her scalp and pain radiates to right ear No trauma She has had this on/off for several months No fever/vomiting No focal weakness No hearing changes She reports blurred vision She has mild dizziness Seen by PCP for this, referred to neuro later in August No recent travel No tick bites  This episode is similar to prior, just lasting longer   Past Medical History:  Diagnosis Date  . Alcohol abuse   . Anemia   . Anxiety   . 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 HCV (hepatitis C virus)    chronic , RNA negative 2016, never treated  . HIV (human immunodeficiency virus infection) [facility])     Patient Active Problem List   Diagnosis Date Noted  . DOE (dyspnea on exertion) 06/05/2016  . HIV disease ([place]) 03/08/2016  . Hepatitis C without hepatic coma 03/08/2016  . Throat pain in adult 11/23/2015  . 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 CESAREAN SECTION     x 3  . LARYNGOSCOPY  12/31/14    OB History    No data available       Home Medications    Prior to Admission medications   Medication Sig Start Date End Date Taking? Authorizing Provider    albuterol (PROVENTIL HFA;VENTOLIN HFA) 108 (90 Base) MCG/ACT inhaler Inhale 2 puffs into the lungs every 6 (six) hours as needed for wheezing or shortness of breath. 06/05/16   Campbell Riches, MD  ascorbic acid (VITAMIN C) 500 MG tablet Take 500 mg by mouth daily.    [provider]  B Complex Vitamins (VITAMIN-B COMPLEX) TABS Take by mouth.    [provider]  diclofenac sodium (VOLTAREN) 1 % GEL Apply 2 g topically as needed (for pain).  06/03/15   [provider]  emtricitabine-rilpivir-tenofovir AF (ODEFSEY) 200-25-25 MG TABS tablet Take 1 tablet by mouth daily with breakfast. 06/05/16   Campbell Riches, MD  gabapentin (NEURONTIN) 300 MG capsule Take one capsule 3 x a day. 03/30/17   Narda Amber K, DO  ibuprofen (ADVIL,MOTRIN) 200 MG tablet Take 600 mg by mouth every 6 (six) hours as needed for fever or moderate pain. Reported on 06/05/2016    [provider]  lidocaine (XYLOCAINE) 2 % solution Patient: Mix 1part 2% viscous lidocaine, 1part H20. Swish and/or swallow 19mL of this mixture, 23min before meals and at bedtime, up to QID 10/08/15   Holley Bouche, NP  nicotine (NICODERM CQ - DOSED IN MG/24 HOURS) 14 mg/24hr patch Place 14 mg onto the skin daily.    [provider]  nicotine polacrilex (COMMIT)  2 MG lozenge Place inside cheek. 07/24/16   [provider]  Nutritional Supplements (ENSURE COMPLETE SHAKE) LIQD Take 1 Bottle by mouth 2 (two) times daily. Patient not taking: Reported on 12/26/2016 08/27/15   Volanda Napoleon, MD  orphenadrine (NORFLEX) 100 MG tablet Take 100 mg by mouth 2 (two) times daily as needed for muscle spasms. Reported on 06/05/2016 10/31/15   [provider]  oxyCODONE (ROXICODONE) 5 MG immediate release tablet Take 5 mg by mouth every 6 (six) hours as needed for severe pain.  02/23/16   [provider]  pentoxifylline (TRENTAL) 400 MG CR tablet Take 400mg  tab daily x 1 week, then 400mg  BID; take  with food 09/06/16   Eppie Gibson, MD  pregabalin (LYRICA) 50 MG capsule Take 200 mg by mouth 2 (two) times daily.  06/03/15 12/26/16  [provider]  vitamin E 400 UNIT capsule Take 400 IU daily x 1 week, then 400 IU BID Patient not taking: Reported on 12/26/2016 09/06/16   Eppie Gibson, MD    Family History Family History  Problem Relation Age of Onset  . Stomach cancer Sister   . Heart disease Paternal Grandfather   . Alzheimer's disease Paternal Grandmother   . Heart disease Maternal Grandmother   . Stroke Maternal Grandmother   . Other Mother        part of intestines taken out  . Dementia Mother   . Other Father        trauma    Social History Social History  Substance Use Topics  . Smoking status: Former Smoker    Packs/day: 0.00    Years: 32.00    Types: Cigarettes    Start date: 01/13/1986  . Smokeless tobacco: Never Used     Comment: occassional cigarette  . Alcohol use No     Comment: Sober since 2012     Allergies   Ancef [cefazolin]; Ciprofloxacin; Penicillin g; and Wasp venom   Review of Systems Review of Systems  Constitutional: Negative for fever.  Respiratory: Negative for shortness of breath.   Cardiovascular: Negative for chest pain.  Gastrointestinal: Negative for vomiting.  Neurological: Positive for headaches.  All other systems reviewed and are negative.    Physical Exam Updated Vital Signs BP 110/90   Pulse 63   Temp 98.1 F (36.7 C) (Oral)   Resp 19   Ht 1.702 m (5\' 7" )   Wt 59.9 kg (132 lb)   SpO2 98%   BMI 20.67 kg/m   Physical Exam CONSTITUTIONAL: Well developed/well nourished HEAD: Normocephalic/atraumatic, no lesions, no abscess/cellulitis to scalp, no visible trauma or swelling EYES: EOMI/PERRL, no nystagmus, no ptosis, ENMT: Mucous membranes moist, right TM clear/intact no erythema, ears symmetric, no erythema to ear NECK: supple no meningeal signs, no bruits SPINE/BACK:entire spine nontender CV: S1/S2 noted,  no murmurs/rubs/gallops noted LUNGS: Lungs are clear to auscultation bilaterally, no apparent distress ABDOMEN: soft, nontender, no rebound or guarding GU:no cva tenderness NEURO:Awake/alert, face symmetric, no arm or leg drift is noted Equal 5/5 strength with shoulder abduction, elbow flex/extension, wrist flex/extension in upper extremities and equal hand grips bilaterally Equal 5/5 strength with hip flexion,knee flex/extension, foot dorsi/plantar flexion Cranial nerves 3/4/5/6/06/25/09/11/12 tested and intact Gait normal without ataxia No past pointing Sensation to light touch intact in all extremities EXTREMITIES: pulses normal, full ROM SKIN: warm, color normal PSYCH: no abnormalities of mood noted, alert and oriented to situation    ED Treatments / Results  Labs (all labs ordered are  listed, but only abnormal results are displayed) Labs Reviewed - No data to display  EKG  EKG Interpretation None       Radiology No results found.  Procedures Procedures (including critical care time)  Medications Ordered in ED Medications  metoCLOPramide (REGLAN) injection 10 mg (10 mg Intravenous Given 07/18/17 0056)  diphenhydrAMINE (BENADRYL) injection 25 mg (25 mg Intravenous Given 07/18/17 0055)  ketorolac (TORADOL) 30 MG/ML injection 15 mg (15 mg Intravenous Given 07/18/17 0056)     Initial Impression / Assessment and Plan / ED Course  I have reviewed the triage vital signs and the nursing notes.      1:01 AM Pt stable She has had HA on/off for months H/o HIV but apparently is compliant/well controlled She has neuro referral but says she can't wait No neuro deficits No fever I have low suspicion for meningitis or SAH at this time 2:12 AM Pt improved No distress She would like to be discharged  We discussed strict ER return precautions   Final Clinical Impressions(s) / ED Diagnoses   Final diagnoses:  Other headache syndrome    New Prescriptions New  Prescriptions   No medications on file     Ripley Fraise, MD 07/18/17 [phone]

## 2017-07-26 ENCOUNTER — Other Ambulatory Visit: Payer: Self-pay | Admitting: *Deleted

## 2017-07-26 DIAGNOSIS — C321 Malignant neoplasm of supraglottis: Secondary | ICD-10-CM

## 2017-07-27 ENCOUNTER — Other Ambulatory Visit: Payer: Self-pay | Admitting: Hematology & Oncology

## 2017-07-27 DIAGNOSIS — C329 Malignant neoplasm of larynx, unspecified: Secondary | ICD-10-CM

## 2017-08-05 ENCOUNTER — Other Ambulatory Visit: Payer: Self-pay | Admitting: Infectious Diseases

## 2017-08-05 DIAGNOSIS — B20 Human immunodeficiency virus [HIV] disease: Secondary | ICD-10-CM

## 2017-08-08 ENCOUNTER — Other Ambulatory Visit: Payer: Self-pay | Admitting: Student

## 2017-08-08 ENCOUNTER — Other Ambulatory Visit: Payer: Self-pay | Admitting: Radiology

## 2017-08-09 ENCOUNTER — Ambulatory Visit (HOSPITAL_COMMUNITY)
Admission: RE | Admit: 2017-08-09 | Discharge: 2017-08-09 | Disposition: A | Discharge status: Home / Self Care | Payer: Medicaid Other | Source: Ambulatory Visit | Attending: Diagnostic Radiology | Admitting: Diagnostic Radiology

## 2017-08-09 ENCOUNTER — Encounter (HOSPITAL_COMMUNITY): Payer: Self-pay

## 2017-08-09 ENCOUNTER — Ambulatory Visit (HOSPITAL_COMMUNITY)
Admission: RE | Admit: 2017-08-09 | Discharge: 2017-08-09 | Disposition: A | Discharge status: Home / Self Care | Payer: Medicaid Other | Source: Ambulatory Visit | Attending: Hematology & Oncology | Admitting: Hematology & Oncology

## 2017-08-09 DIAGNOSIS — Z791 Long term (current) use of non-steroidal anti-inflammatories (NSAID): Secondary | ICD-10-CM | POA: Diagnosis not present

## 2017-08-09 DIAGNOSIS — F419 Anxiety disorder, unspecified: Secondary | ICD-10-CM | POA: Diagnosis not present

## 2017-08-09 DIAGNOSIS — Z8521 Personal history of malignant neoplasm of larynx: Secondary | ICD-10-CM | POA: Insufficient documentation

## 2017-08-09 DIAGNOSIS — Z8619 Personal history of other infectious and parasitic diseases: Secondary | ICD-10-CM | POA: Diagnosis not present

## 2017-08-09 DIAGNOSIS — C329 Malignant neoplasm of larynx, unspecified: Secondary | ICD-10-CM

## 2017-08-09 DIAGNOSIS — Z79899 Other long term (current) drug therapy: Secondary | ICD-10-CM | POA: Insufficient documentation

## 2017-08-09 DIAGNOSIS — Z21 Asymptomatic human immunodeficiency virus [HIV] infection status: Secondary | ICD-10-CM | POA: Insufficient documentation

## 2017-08-09 DIAGNOSIS — Z452 Encounter for adjustment and management of vascular access device: Secondary | ICD-10-CM | POA: Diagnosis present

## 2017-08-09 DIAGNOSIS — Z87891 Personal history of nicotine dependence: Secondary | ICD-10-CM | POA: Insufficient documentation

## 2017-08-09 HISTORY — PX: IR REMOVAL TUN ACCESS W/ PORT W/O FL MOD SED: IMG2290

## 2017-08-09 LAB — CBC
HCT: 38.1 % (ref 36.0–46.0)
HEMOGLOBIN: 13.2 g/dL (ref 12.0–15.0)
MCH: 31.4 pg (ref 26.0–34.0)
MCHC: 34.6 g/dL (ref 30.0–36.0)
MCV: 90.5 fL (ref 78.0–100.0)
Platelets: 242 10*3/uL (ref 150–400)
RBC: 4.21 MIL/uL (ref 3.87–5.11)
RDW: 12.2 % (ref 11.5–15.5)
WBC: 6.1 10*3/uL (ref 4.0–10.5)

## 2017-08-09 LAB — BASIC METABOLIC PANEL
ANION GAP: 6 (ref 5–15)
BUN: 13 mg/dL (ref 6–20)
CALCIUM: 9.6 mg/dL (ref 8.9–10.3)
CO2: 27 mmol/L (ref 22–32)
Chloride: 109 mmol/L (ref 101–111)
Creatinine, Ser: 0.67 mg/dL (ref 0.44–1.00)
Glucose, Bld: 101 mg/dL — ABNORMAL HIGH (ref 65–99)
Potassium: 4.3 mmol/L (ref 3.5–5.1)
SODIUM: 142 mmol/L (ref 135–145)

## 2017-08-09 LAB — PROTIME-INR
INR: 0.93
PROTHROMBIN TIME: 12.5 s (ref 11.4–15.2)

## 2017-08-09 LAB — APTT: APTT: 28 s (ref 24–36)

## 2017-08-09 MED ORDER — MIDAZOLAM HCL 2 MG/2ML IJ SOLN
INTRAMUSCULAR | Status: AC | PRN
  Administered 2017-08-09 (×2): 1 mg via INTRAVENOUS

## 2017-08-09 MED ORDER — VANCOMYCIN HCL IN DEXTROSE 1-5 GM/200ML-% IV SOLN
1000.0000 mg | INTRAVENOUS | Status: AC
  Administered 2017-08-09: 1000 mg via INTRAVENOUS

## 2017-08-09 MED ORDER — FENTANYL CITRATE (PF) 100 MCG/2ML IJ SOLN
INTRAMUSCULAR | Status: AC
  Filled 2017-08-09: qty 4

## 2017-08-09 MED ORDER — VANCOMYCIN HCL IN DEXTROSE 1-5 GM/200ML-% IV SOLN
INTRAVENOUS | Status: AC
  Administered 2017-08-09: 1000 mg via INTRAVENOUS
  Filled 2017-08-09: qty 200

## 2017-08-09 MED ORDER — SODIUM CHLORIDE 0.9 % IV SOLN
INTRAVENOUS | Status: DC
  Administered 2017-08-09: 11:00:00 via INTRAVENOUS

## 2017-08-09 MED ORDER — MIDAZOLAM HCL 2 MG/2ML IJ SOLN
INTRAMUSCULAR | Status: AC
  Filled 2017-08-09: qty 4

## 2017-08-09 MED ORDER — HYDROCODONE-ACETAMINOPHEN 5-325 MG PO TABS: 1.0000 | ORAL_TABLET | ORAL | Status: DC | PRN

## 2017-08-09 MED ORDER — LIDOCAINE-EPINEPHRINE (PF) 2 %-1:200000 IJ SOLN
INTRAMUSCULAR | Status: AC
  Filled 2017-08-09: qty 20

## 2017-08-09 MED ORDER — LIDOCAINE-EPINEPHRINE (PF) 2 %-1:200000 IJ SOLN
INTRAMUSCULAR | Status: AC | PRN
  Administered 2017-08-09: 10 mL via INTRADERMAL

## 2017-08-09 MED ORDER — FENTANYL CITRATE (PF) 100 MCG/2ML IJ SOLN
INTRAMUSCULAR | Status: AC | PRN
  Administered 2017-08-09 (×2): 50 ug via INTRAVENOUS

## 2017-08-09 MED ORDER — CIPROFLOXACIN IN D5W 400 MG/200ML IV SOLN
INTRAVENOUS | Status: AC
  Filled 2017-08-09: qty 200

## 2017-08-09 NOTE — Procedures (Signed)
Successful removal of right chest port.  No immediate complication and minimal blood loss. 

## 2017-08-09 NOTE — Discharge Instructions (Signed)
Moderate Conscious Sedation, Adult, Care After These instructions provide you with information about caring for yourself after your procedure. Your health care provider may also give you more specific instructions. Your treatment has been planned according to current medical practices, but problems sometimes occur. Call your health care provider if you have any problems or questions after your procedure. What can I expect after the procedure? After your procedure, it is common:  To feel sleepy for several hours.  To feel clumsy and have poor balance for several hours.  To have poor judgment for several hours.  To vomit if you eat too soon.  Follow these instructions at home: For at least 24 hours after the procedure:   Do not: ? Participate in activities where you could fall or become injured. ? Drive. ? Use heavy machinery. ? Drink alcohol. ? Take sleeping pills or medicines that cause drowsiness. ? Make important decisions or sign legal documents. ? Take care of children on your own.  Rest. Eating and drinking  Follow the diet recommended by your health care provider.  If you vomit: ? Drink water, juice, or soup when you can drink without vomiting. ? Make sure you have little or no nausea before eating solid foods. General instructions  Have a responsible adult stay with you until you are awake and alert.  Take over-the-counter and prescription medicines only as told by your health care provider.  If you smoke, do not smoke without supervision.  Keep all follow-up visits as told by your health care provider. This is important. Contact a health care provider if:  You keep feeling nauseous or you keep vomiting.  You feel light-headed.  You develop a rash.  You have a fever. Get help right away if:  You have trouble breathing. This information is not intended to replace advice given to you by your health care provider. Make sure you discuss any questions you  have with your health care provider. Document Released: 09/24/2013 Document Revised: 05/08/2016 Document Reviewed: 03/25/2016 Elsevier Interactive Patient Education  2018 [employer] bandage in 24 hours and shower. Leave skin glue on - will peel off.    Implanted Port Removal, Care After Refer to this sheet in the next few weeks. These instructions provide you with information about caring for yourself after your procedure. Your health care provider may also give you more specific instructions. Your treatment has been planned according to current medical practices, but problems sometimes occur. Call your health care provider if you have any problems or questions after your procedure. What can I expect after the procedure? After the procedure, it is common to have:  Soreness or pain near your incision.  Some swelling or bruising near your incision.  Follow these instructions at home: Medicines  Take over-the-counter and prescription medicines only as told by your health care provider.  If you were prescribed an antibiotic medicine, take it as told by your health care provider. Do not stop taking the antibiotic even if you start to feel better. Bathing  Do not take baths, swim, or use a hot tub until your health care provider approves. Ask your health care provider if you can take showers. You may only be allowed to take sponge baths for bathing. Incision care  Follow instructions from your health care provider about how to take care of your incision. Make sure you: ? Wash your hands with soap and water before you change your bandage (dressing). If soap and  water are not available, use hand sanitizer. ? Change your dressing as told by your health care provider. ? Keep your dressing dry. ? Leave stitches (sutures), skin glue, or adhesive strips in place. These skin closures may need to stay in place for 2 weeks or longer. If adhesive strip edges start to loosen and curl up,  you may trim the loose edges. Do not remove adhesive strips completely unless your health care provider tells you to do that.  Check your incision area every day for signs of infection. Check for: ? More redness, swelling, or pain. ? More fluid or blood. ? Warmth. ? Pus or a bad smell. Driving  If you received a sedative, do not drive for 24 hours after the procedure.  If you did not receive a sedative, ask your health care provider when it is safe to drive. Activity  Return to your normal activities as told by your health care provider. Ask your health care provider what activities are safe for you.  Until your health care provider says it is safe: ? Do not lift anything that is heavier than 10 lb (4.5 kg). ? Do not do activities that involve lifting your arms over your head. General instructions  Do not use any tobacco products, such as cigarettes, chewing tobacco, and e-cigarettes. Tobacco can delay healing. If you need help quitting, ask your health care provider.  Keep all follow-up visits as told by your health care provider. This is important. Contact a health care provider if:  You have more redness, swelling, or pain around your incision.  You have more fluid or blood coming from your incision.  Your incision feels warm to the touch.  You have pus or a bad smell coming from your incision.  You have a fever.  You have pain that is not relieved by your pain medicine. Get help right away if:  You have chest pain.  You have difficulty breathing. This information is not intended to replace advice given to you by your health care provider. Make sure you discuss any questions you have with your health care provider. Document Released: 11/15/2015 Document Revised: 05/11/2016 Document Reviewed: 09/08/2015 Elsevier Interactive Patient Education  [employer].

## 2017-08-09 NOTE — H&P (Signed)
Chief Complaint: Patient was seen in consultation today for   Referring Physician(s): Ennever,Peter R  Supervising Physician: Markus Daft  Patient Status: [facility] - Out-pt  History of Present Illness: [name] is a 51 y.o. female with past medical history of HIV, alcohol abuse, anxiety, HCV, and supraglottic cancer.  She recently completed therapy.  IR consulted for Port-A-Cath removal at the request of Dr. Marin Olp.  Port originally placed in 2016 by Dr. Pascal Lux.   Patient presents for procedure today in her usual state of health.  She has been NPO. She does not take blood thinners.   Past Medical History:  Diagnosis Date  . Alcohol abuse   . Anemia   . Anxiety   . Cancer ([place]) 12/31/2014   squamous cell of larynx  . HCV (hepatitis C virus)    chronic , RNA negative 2016, never treated  . HIV (human immunodeficiency virus infection) ([place])     Past Surgical History:  Procedure Laterality Date  . CESAREAN SECTION     x 3  . LARYNGOSCOPY  12/31/14    Allergies: Ancef [cefazolin]; Ciprofloxacin; Penicillin g; and Wasp venom  Medications: Prior to Admission medications   Medication Sig Start Date End Date Taking? Authorizing Provider  ascorbic acid (VITAMIN C) 500 MG tablet Take 500 mg by mouth daily.   Yes [provider]  gabapentin (NEURONTIN) 300 MG capsule Take one capsule 3 x a day. Patient taking differently: Take 300 mg by mouth 3 (three) times daily.  03/30/17  Yes Patel, Donika K, DO  ibuprofen (ADVIL,MOTRIN) 200 MG tablet Take 600 mg by mouth every 6 (six) hours as needed for fever or moderate pain. Reported on 06/05/2016   Yes [provider]  ODEFSEY 200-25-25 MG TABS tablet TAKE 1 TABLET BY MOUTH DAILY WITH BREAKFAST 08/06/17  Yes Campbell Riches, MD  oxyCODONE (ROXICODONE) 5 MG immediate release tablet Take 5 mg by mouth every 6 (six) hours as needed for severe pain.  02/23/16  Yes [provider]  albuterol (PROVENTIL  HFA;VENTOLIN HFA) 108 (90 Base) MCG/ACT inhaler Inhale 2 puffs into the lungs every 6 (six) hours as needed for wheezing or shortness of breath. Patient not taking: Reported on 07/18/2017 06/05/16   Campbell Riches, MD  lidocaine (XYLOCAINE) 2 % solution Patient: Mix 1part 2% viscous lidocaine, 1part H20. Swish and/or swallow 87mL of this mixture, 49min before meals and at bedtime, up to QID Patient not taking: Reported on 07/18/2017 10/08/15   Holley Bouche, NP  Nutritional Supplements (ENSURE COMPLETE SHAKE) LIQD Take 1 Bottle by mouth 2 (two) times daily. Patient not taking: Reported on 12/26/2016 08/27/15   Volanda Napoleon, MD  pentoxifylline (TRENTAL) 400 MG CR tablet Take 400mg  tab daily x 1 week, then 400mg  BID; take with food Patient not taking: Reported on 07/18/2017 09/06/16   Eppie Gibson, MD  vitamin E 400 UNIT capsule Take 400 IU daily x 1 week, then 400 IU BID Patient not taking: Reported on 12/26/2016 09/06/16   Eppie Gibson, MD     Family History  Problem Relation Age of Onset  . Stomach cancer Sister   . Heart disease Paternal Grandfather   . Alzheimer's disease Paternal Grandmother   . Heart disease Maternal Grandmother   . Stroke Maternal Grandmother   . Other Mother        part of intestines taken out  . Dementia Mother   . Other Father  trauma    Social History   Social History  . Marital status: Widowed    Spouse name: N/A  . Number of children: 3  . Years of education: N/A   Occupational History  . customer service    Social History Main Topics  . Smoking status: Former Smoker    Packs/day: 0.00    Years: 32.00    Types: Cigarettes    Start date: 01/13/1986  . Smokeless tobacco: Never Used     Comment: occassional cigarette  . Alcohol use No     Comment: Sober since 2012  . Drug use: No  . Sexual activity: Not Asked     Comment: declined condoms   Other Topics Concern  . None   Social History Narrative   Lives with aunt in a one story  home.  Has 3 children.     Works part time with [employer] at [employer].     Education: high school.    Review of Systems  Constitutional: Negative for fatigue and fever.  Respiratory: Negative for cough and shortness of breath.   Cardiovascular: Negative for chest pain.  Gastrointestinal: Negative for abdominal pain.  Psychiatric/Behavioral: Negative for behavioral problems and confusion.    Vital Signs: BP 92/74   Pulse 70   Temp 97.6 F (36.4 C) (Oral)   Resp 16   Ht 5\' 7"  (1.702 m)   Wt 131 lb 6.4 oz (59.6 kg)   SpO2 100%   BMI 20.58 kg/m   Physical Exam  Constitutional: She is oriented to person, place, and time. She appears well-developed.  Cardiovascular: Normal rate, regular rhythm and normal heart sounds.   Pulmonary/Chest: Effort normal and breath sounds normal. No respiratory distress.  Neurological: She is alert and oriented to person, place, and time.  Skin: Skin is warm and dry.  Psychiatric: She has a normal mood and affect. Her behavior is normal. Judgment and thought content normal.  Nursing note and vitals reviewed.   Mallampati Score:  MD Evaluation Airway: WNL Heart: WNL Abdomen: WNL Chest/ Lungs: WNL ASA  Classification: 3 Mallampati/Airway Score: Two  Imaging: No results found.  Labs:  CBC:  Recent Labs  12/26/16 1541 04/04/17 1101 08/09/17 1037  WBC 6.4 5.0 6.1  HGB 13.9 13.1 13.2  HCT 40.3 38.5 38.1  PLT 235 223 242    COAGS:  Recent Labs  08/09/17 1037  INR 0.93  APTT 28    BMP:  Recent Labs  12/26/16 1541 04/04/17 1101  NA 136 135  K 4.6 4.0  CL 99 104  CO2 29 21*  GLUCOSE 76 105*  BUN 13 9  CALCIUM 9.7 9.7  CREATININE 0.74 0.73  GFRNONAA  --  >60  GFRAA  --  >60    LIVER FUNCTION TESTS:  Recent Labs  12/26/16 1541 04/04/17 1101  BILITOT 0.3 0.2*  AST 15 23  ALT 11 16  ALKPHOS 72 64  PROT 7.1 6.8  ALBUMIN 4.0 4.2    TUMOR MARKERS: No results for input(s): AFPTM, CEA, CA199, CHROMGRNA in the  last 8760 hours.  Assessment and Plan: Patient with past medical history of HIV, HCV, and supraglottic cancer presents at the completion of treatment.  IR consulted for Port-A-Cath removal at the request of Dr. Marin Olp. Patient presents today in their usual state of health.  She has been NPO and is not currently on blood thinners.  Risks and benefits discussed with the patient including, but not limited to bleeding, infection, pneumothorax,  or fibrin sheath development and need for additional procedures. All of the patient's questions were answered, patient is agreeable to proceed. Consent signed and in chart.   Thank you for this interesting consult.  I greatly enjoyed meeting [name] and look forward to participating in their care.  A copy of this report was sent to the requesting provider on this date.  Electronically Signed: Docia Barrier, PA 08/09/2017, 11:09 AM   I spent a total of    15 Minutes in face to face in clinical consultation, greater than 50% of which was counseling/coordinating care for supraglottic cancer.

## 2017-08-13 ENCOUNTER — Ambulatory Visit (INDEPENDENT_AMBULATORY_CARE_PROVIDER_SITE_OTHER): Payer: Medicaid Other | Admitting: Neurology

## 2017-08-13 ENCOUNTER — Encounter: Payer: Self-pay | Admitting: Neurology

## 2017-08-13 VITALS — BP 108/70 | HR 87 | Ht 67.0 in | Wt 134.6 lb

## 2017-08-13 DIAGNOSIS — G4485 Primary stabbing headache: Secondary | ICD-10-CM | POA: Diagnosis not present

## 2017-08-13 DIAGNOSIS — R51 Headache: Secondary | ICD-10-CM | POA: Diagnosis not present

## 2017-08-13 DIAGNOSIS — F419 Anxiety disorder, unspecified: Secondary | ICD-10-CM | POA: Diagnosis not present

## 2017-08-13 DIAGNOSIS — G3184 Mild cognitive impairment, so stated: Secondary | ICD-10-CM | POA: Diagnosis not present

## 2017-08-13 DIAGNOSIS — R519 Headache, unspecified: Secondary | ICD-10-CM

## 2017-08-13 MED ORDER — NORTRIPTYLINE HCL 10 MG PO CAPS: ORAL_CAPSULE | ORAL | 5 refills | Status: DC

## 2017-08-13 NOTE — Patient Instructions (Addendum)
Taper gabapentin 300mg  TID, by 300mg  every 3 days Start nortriptyline 10mg  at bedtime for 2 week, then increase to 2 tablet at bedtime Neuropsychological testing MRI brain wwo contrast  Recommend that you talk to your primary care doctor about anxiety   Return to clinic in 9 months

## 2017-08-13 NOTE — Progress Notes (Signed)
Follow-up Visit   Date: 08/13/17    [name] MRN: [id number] DOB: Mar 31, 1966   Interim History: [name] is a 51 y.o. 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  The patient was accompanied to the clinic by self.  History of present illness: Starting 2015, she began complains of bilateral lower leg tingling sensation, which lasts for a few seconds.  She denies numbness of the feet.  Symptoms are sporadic, lasting a few seconds and occurs daily.  She complains of imbalance and has started taking yoga class to help. There is mild weakness of the left leg.  She also complains of tingling over the scalp and neck which occurred once over the last month.  She is sober since 2012, and previously used to drink liquor daily x 35 years.  She denies any tingling of the hands.  There was no change in her symptoms even after chemotherapy was stopped.   Since having chemotherapy, she began noticing increased forgetfulness, especially with names and makes a list for everything. She misplaces items frequently.  She does not repeat things frequently.  She denies getting lost.   She manages her own IADLs such as driving, manages her finances, and household chores.  Sometimes she forgets taking her medications and does not use a pillbox. She endorses difficulty with word-findings and following conversations.  Her mother and paternal grandmother both had Alzheimer's dementia.  UPDATE 08/13/2017:  She is here because of worsening headaches.  She complains of intermittent stinging and sharp pain which occurs daily, lasting about 10-30 seconds.  Pain can vary in location.  She has not noticed anything that improves her headaches.  She takes gabapentin 300mg  TID which helps but it would make her  too sleepy.  She also takes oxycodone 5mg  daily as needed for tendonitis.  She also complains of having "nerves" not working.  When asked to elaborate, she reports being shaky, restless.  Her leg paresthesias have essentially resolved.  She is worried about her progressive memory problems and being forgetful.  She did not go for MRI brain which was ordered at her last visit.    Medications:  Current Outpatient Prescriptions on File Prior to Visit  Medication Sig Dispense Refill  . albuterol (PROVENTIL HFA;VENTOLIN HFA) 108 (90 Base) MCG/ACT inhaler Inhale 2 puffs into the lungs every 6 (six) hours as needed for wheezing or shortness of breath. (Patient not taking: Reported on 07/18/2017) 1 Inhaler 6  . ascorbic acid (VITAMIN C) 500 MG tablet Take 500 mg by mouth daily.    Marland Kitchen gabapentin (NEURONTIN) 300 MG capsule Take one capsule 3 x a day. (Patient taking differently: Take 300 mg by mouth 3 (three) times daily. ) 90 capsule 5  . ibuprofen (ADVIL,MOTRIN) 200 MG tablet Take 600 mg by mouth every 6 (six) hours as needed for fever or moderate pain. Reported on 06/05/2016    . lidocaine (XYLOCAINE) 2 % solution Patient: Mix 1part 2% viscous lidocaine, 1part H20. Swish and/or swallow 91mL of this mixture, 49min before meals and at bedtime, up to QID (Patient not taking: Reported on 07/18/2017) 100 mL 5  . Nutritional Supplements (ENSURE COMPLETE SHAKE) LIQD Take 1 Bottle by mouth 2 (two) times daily. (Patient not taking: Reported on 12/26/2016) 60 Bottle 12  . ODEFSEY 200-25-25 MG TABS tablet TAKE 1  TABLET BY MOUTH DAILY WITH BREAKFAST 30 tablet 0  . oxyCODONE (ROXICODONE) 5 MG immediate release tablet Take 5 mg by mouth every 6 (six) hours as needed for severe pain.     . pentoxifylline (TRENTAL) 400 MG CR tablet Take 400mg  tab daily x 1 week, then 400mg  BID; take with food (Patient not taking: Reported on 07/18/2017) 60 tablet 5  . vitamin E 400 UNIT capsule Take 400 IU daily x 1 week, then 400 IU BID (Patient not  taking: Reported on 12/26/2016) 60 capsule 5  . [DISCONTINUED] omeprazole (PRILOSEC) 40 MG capsule Take 1 capsule (40 mg total) by mouth daily. (Patient not taking: Reported on 11/05/2015) 90 capsule 3   No current facility-administered medications on file prior to visit.     Allergies:  Allergies  Allergen Reactions  . Ancef [Cefazolin] Hives  . Ciprofloxacin Hives  . Penicillin G Rash    Pt states "some kind of Penicillin. Not sure what kind" causes her to break out in rash on chest  . Wasp Venom Swelling    Facial and cranial swelling    Review of Systems:  CONSTITUTIONAL: No fevers, chills, night sweats, or weight loss.  EYES: No visual changes or eye pain ENT: No hearing changes.  No history of nose bleeds.   RESPIRATORY: No cough, wheezing and shortness of breath.   CARDIOVASCULAR: Negative for chest pain, and palpitations.   GI: Negative for abdominal discomfort, blood in stools or black stools.  No recent change in bowel habits.   GU:  No history of incontinence.   MUSCLOSKELETAL: No history of joint pain or swelling.  No myalgias.   SKIN: Negative for lesions, rash, and itching.   ENDOCRINE: Negative for cold or heat intolerance, polydipsia or goiter.   PSYCH:  No depression +anxiety symptoms.   NEURO: As Above.   Vital Signs:  BP 108/70   Pulse 87   Ht 5\' 7"  (1.702 m)   Wt 134 lb 9 oz (61 kg)   SpO2 97%   BMI 21.08 kg/m   Neurological Exam: MENTAL STATUS including orientation to time, place, person, recent and remote memory, attention span and concentration, language, and fund of knowledge is fair.  She appears distracted and scattered, there is psychomotor agitation. Processing simple commands is slow. Speech is not dysarthric.  CRANIAL NERVES: No visual field defects. Pupils equal round and reactive to light.  Normal conjugate, extra-ocular eye movements in all directions of gaze.  No ptosis. Face is symmetric. Palate elevates symmetrically.  Tongue is  midline.  MOTOR:  Motor strength is 5/5 in all extremities.  No pronator drift.  Tone is normal.    MSRs:  Reflexes are 2+/4 throughout.  SENSORY:  Intact to vibration.  COORDINATION/GAIT:  Slowed finger tapping, inconsistent. Finger to nose shows mild dysmetria, also inconsistent.  Gait narrow based and stable.   Data: NCS/EMG of the legs 12/28/2016:  Normal  Lab Results  Component Value Date   [id number] 017 11/15/2016   Lab Results  Component Value Date   TSH 0.80 11/15/2016   MRI lumbar spine wo contrast 10/11/2016: 1. At L2-3 there is a mild broad-based disc bulge and mild left lateral recess stenosis. 2. At L3-4 there is a broad-based disc bulge. 3. At L4-5 there is a broad-based disc bulge with mild right foraminal stenosis and mild spinal stenosis. 4. At L5-S1 there is a broad-based disc bulge and mild bilateral facet arthropathy.   IMPRESSION/PLAN: 1.  Primary stabbing headaches and  scalp paresthesias  - Taper gabapentin 300mg  TID, by 300mg  every 3 days due to increased sedation and inability to further optimize  - Start nortriptyline 10mg  at bedtime for 2 week, then increase to 2 tablet at bedtime  2.  Multifactorial cognitive impairment.  I suspect that most of her cognitive dysfunction is due to a combination of side effects from chemotherapy, medical comorbidities of HIV, and long history of alcoholism, and possible mood disorder.  She appears very restless today and complaints of "nerves" being off suggesting underlying anxiety.              - With her history of cancer, I will order MRI brain wwo contrast to look for any structural pathology              - Formal neurocognitive testing   3.  Anxiety   - Follow-up with PCP to address anxiety  4.  Bilateral lower leg paresthesias - improved.  Neurological exam is remarkably intact and NCS/EMG is normal  5.  Long history of tobacco and alcohol abuse. Encouraged tobacco cessation.  She remains sober since  2012.    The duration of this appointment visit was 25 minutes of face-to-face time with the patient.  Greater than 50% of this time was spent in counseling, explanation of diagnosis, planning of further management, and coordination of care.   Thank you for allowing me to participate in patient's care.  If I can answer any additional questions, I would be pleased to do so.    Sincerely,    Azarah Dacy K. Posey Pronto, DO

## 2017-08-14 ENCOUNTER — Ambulatory Visit: Payer: Medicaid Other | Admitting: Gastroenterology

## 2017-09-10 ENCOUNTER — Other Ambulatory Visit: Payer: Self-pay | Admitting: Pharmacist

## 2017-09-10 ENCOUNTER — Other Ambulatory Visit: Payer: Medicaid Other

## 2017-09-10 DIAGNOSIS — B182 Chronic viral hepatitis C: Secondary | ICD-10-CM

## 2017-09-10 DIAGNOSIS — B20 Human immunodeficiency virus [HIV] disease: Secondary | ICD-10-CM

## 2017-09-11 LAB — T-HELPER CELL (CD4) - (RCID CLINIC ONLY)
CD4 T CELL HELPER: 33 % (ref 33–55)
CD4 T Cell Abs: 510 /uL (ref 400–2700)

## 2017-09-12 LAB — COMPLETE METABOLIC PANEL WITH GFR
AG RATIO: 1.6 (calc) (ref 1.0–2.5)
ALBUMIN MSPROF: 4.2 g/dL (ref 3.6–5.1)
ALKALINE PHOSPHATASE (APISO): 59 U/L (ref 33–130)
ALT: 13 U/L (ref 6–29)
AST: 16 U/L (ref 10–35)
BILIRUBIN TOTAL: 0.4 mg/dL (ref 0.2–1.2)
BUN: 8 mg/dL (ref 7–25)
CHLORIDE: 103 mmol/L (ref 98–110)
CO2: 25 mmol/L (ref 20–32)
Calcium: 9.5 mg/dL (ref 8.6–10.4)
Creat: 0.65 mg/dL (ref 0.50–1.05)
GFR, Est African American: 118 mL/min/{1.73_m2} (ref >=60)
GFR, Est Non African American: 102 mL/min/{1.73_m2} (ref >=60)
GLUCOSE: 90 mg/dL (ref 65–99)
Globulin: 2.6 g/dL (calc) (ref 1.9–3.7)
POTASSIUM: 4.1 mmol/L (ref 3.5–5.3)
Sodium: 136 mmol/L (ref 135–146)
Total Protein: 6.8 g/dL (ref 6.1–8.1)

## 2017-09-12 LAB — CBC WITH DIFFERENTIAL/PLATELET
BASOS ABS: 22 {cells}/uL (ref 0–200)
Basophils Relative: 0.5 %
EOS ABS: 119 {cells}/uL (ref 15–500)
Eosinophils Relative: 2.7 %
HCT: 36.8 % (ref 35.0–45.0)
HEMOGLOBIN: 12.9 g/dL (ref 11.7–15.5)
Lymphs Abs: 1628 cells/uL (ref 850–3900)
MCH: 31.9 pg (ref 27.0–33.0)
MCHC: 35.1 g/dL (ref 32.0–36.0)
MCV: 90.9 fL (ref 80.0–100.0)
MONOS PCT: 11.9 %
MPV: 9.9 fL (ref 7.5–12.5)
Neutro Abs: 2108 cells/uL (ref 1500–7800)
Neutrophils Relative %: 47.9 %
PLATELETS: 198 10*3/uL (ref 140–400)
RBC: 4.05 10*6/uL (ref 3.80–5.10)
RDW: 12.1 % (ref 11.0–15.0)
TOTAL LYMPHOCYTE: 37 %
WBC mixed population: 524 cells/uL (ref 200–950)
WBC: 4.4 10*3/uL (ref 3.8–10.8)

## 2017-09-12 LAB — HEPATITIS C RNA QUANTITATIVE
HCV Quantitative Log: <1.18 Log IU/mL
HCV RNA, PCR, QN: NOT DETECTED [IU]/mL

## 2017-09-12 LAB — HIV-1 RNA QUANT-NO REFLEX-BLD
HIV 1 RNA Quant: 1350 copies/mL — ABNORMAL HIGH
HIV-1 RNA Quant, Log: 3.13 Log copies/mL — ABNORMAL HIGH

## 2017-09-18 ENCOUNTER — Telehealth: Payer: Self-pay | Admitting: *Deleted

## 2017-09-18 NOTE — Telephone Encounter (Signed)
Patient walked into clinic asking for a copy of her recent lab work. Explained that Dr. Johnnye Sima has not reviewed her labs yet and he would go over them with her at her office visit on 09/26/17. She was fine with waiting until then.

## 2017-09-26 ENCOUNTER — Encounter: Payer: Self-pay | Admitting: Infectious Diseases

## 2017-09-26 ENCOUNTER — Ambulatory Visit (INDEPENDENT_AMBULATORY_CARE_PROVIDER_SITE_OTHER): Payer: Medicaid Other | Admitting: Infectious Diseases

## 2017-09-26 DIAGNOSIS — C321 Malignant neoplasm of supraglottis: Secondary | ICD-10-CM

## 2017-09-26 DIAGNOSIS — Z23 Encounter for immunization: Secondary | ICD-10-CM | POA: Diagnosis present

## 2017-09-26 DIAGNOSIS — G4485 Primary stabbing headache: Secondary | ICD-10-CM

## 2017-09-26 DIAGNOSIS — B182 Chronic viral hepatitis C: Secondary | ICD-10-CM

## 2017-09-26 DIAGNOSIS — B20 Human immunodeficiency virus [HIV] disease: Secondary | ICD-10-CM

## 2017-09-26 NOTE — Progress Notes (Signed)
   Subjective:    Patient ID: [name], female    DOB: 1966-02-05, 51 y.o.   MRN: [id number]  HPI 51 yo F with hx of T3 N2 supraglottic cancer. She had completed XRT (10-2015) for this however she has resumed XRT Jan 2018.  States she had repeat repeat ENT eval and was told everything looked good.  She is also HIV+ since 1999, and has been followed at [facility]- on complera --> odefsy.  She also has a hx of Hep C. Her Hep C VL was negative 12-2014 nad 02-2016. She also has a hx of ETOH abuse. Has been clean for 4 years.   Worried that her memory is "not that great". Attributes to CTX.  Has been on odefsy. States she forgets to take it sometimes.   HIV 1 RNA Quant (copies/mL)  Date Value  09/10/2017 1,350 (H)  06/05/2016 444 (H)  12/23/2015 <20   CD4 T Cell Abs (/uL)  Date Value  09/10/2017 510  12/26/2016 600  06/05/2016 360 (L)     Review of Systems  Constitutional: Negative for appetite change, chills, fever and unexpected weight change.  Gastrointestinal: Negative for constipation and diarrhea.  Genitourinary: Negative for difficulty urinating.  Please see HPI. All other systems reviewed and negative. Last mammo > 1 year.      Objective:   Physical Exam  Constitutional: She appears well-developed and well-nourished.  HENT:  Mouth/Throat: No oropharyngeal exudate.  Eyes: Pupils are equal, round, and reactive to light. EOM are normal.  Neck: Neck supple.  Cardiovascular: Normal rate, regular rhythm and normal heart sounds.   Pulmonary/Chest: Effort normal and breath sounds normal.  Abdominal: Soft. Bowel sounds are normal. There is no tenderness. There is no rebound.  Musculoskeletal: She exhibits no edema.  Lymphadenopathy:    She has no cervical adenopathy.  Psychiatric: She has a normal mood and affect.      Assessment & Plan:

## 2017-09-26 NOTE — Assessment & Plan Note (Signed)
She has f/u with neuro Is going to have MRI with neuro by her hx.

## 2017-09-26 NOTE — Assessment & Plan Note (Signed)
Her last VL was und Will continue to watch.

## 2017-09-26 NOTE — Assessment & Plan Note (Signed)
She has gotten flu shot She will come back for repeat HIV RNA and genotype.  Her CD4 is stable.  Will see her back in 4 months.

## 2017-09-26 NOTE — Assessment & Plan Note (Signed)
She will f/u with ENT at [facility].

## 2017-09-29 LAB — HIV-1 RNA ULTRAQUANT REFLEX TO GENTYP+
HIV 1 RNA Quant: <20 Copies/mL — ABNORMAL HIGH
HIV-1 RNA Quant, Log: <1.3 Log cps/mL — ABNORMAL HIGH

## 2017-10-01 ENCOUNTER — Other Ambulatory Visit: Payer: Self-pay | Admitting: Infectious Diseases

## 2017-10-01 DIAGNOSIS — Z1231 Encounter for screening mammogram for malignant neoplasm of breast: Secondary | ICD-10-CM

## 2017-10-01 IMAGING — CR DG SHOULDER 2+V*R*
3 series · 3 of 3 positions shown · non-contrast
Comparison: None.

CLINICAL DATA: Right shoulder pain which began this afternoon. No
known injury. Initial encounter.

EXAM:
RIGHT SHOULDER - 2+ VIEW

[w shoulder external right]
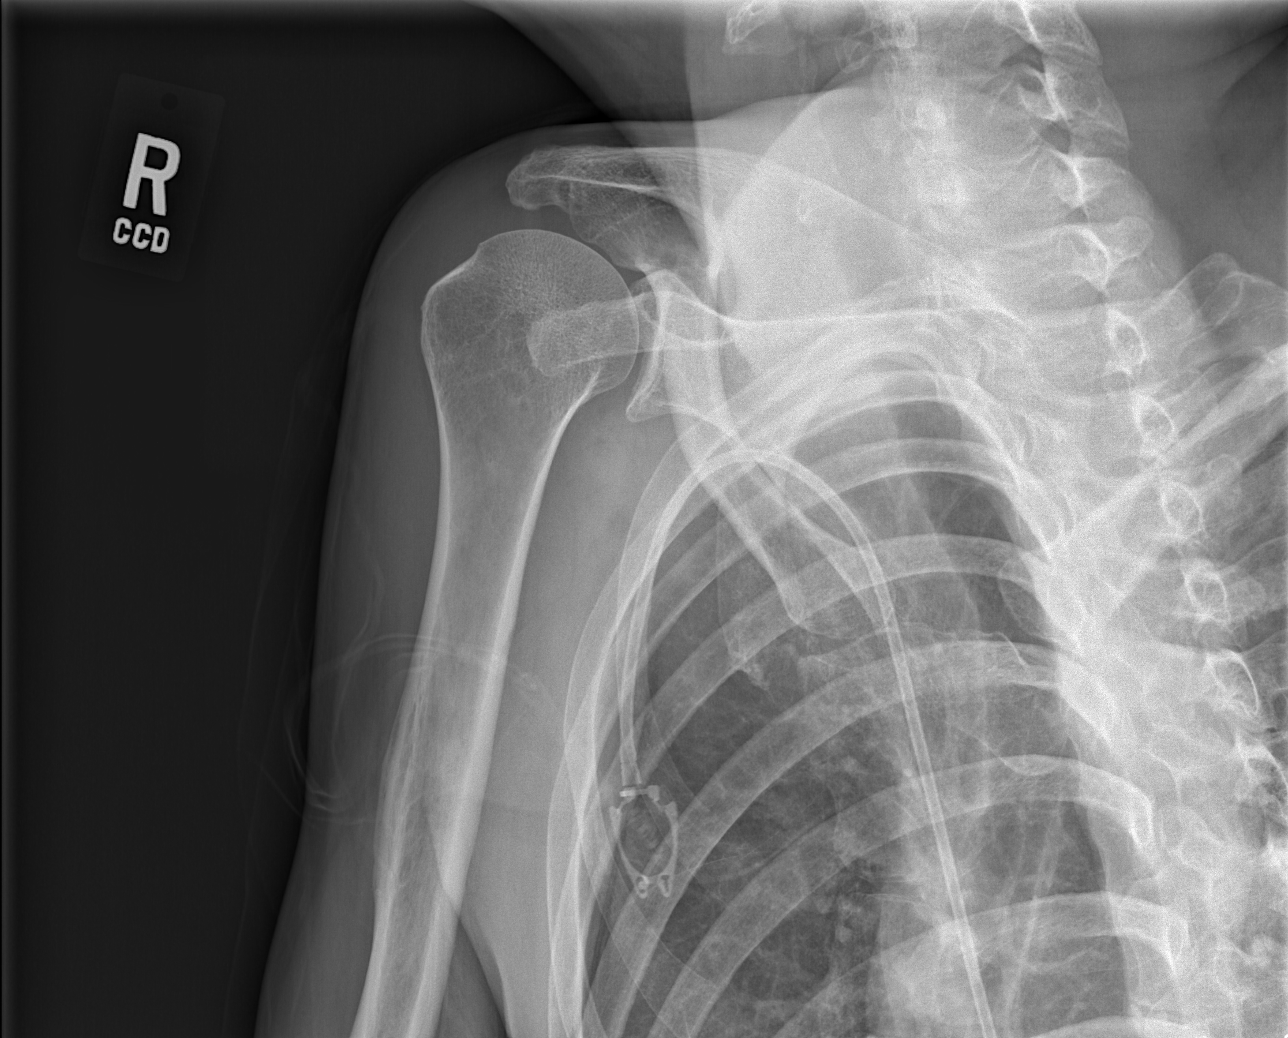

[w shoulder y-view right]
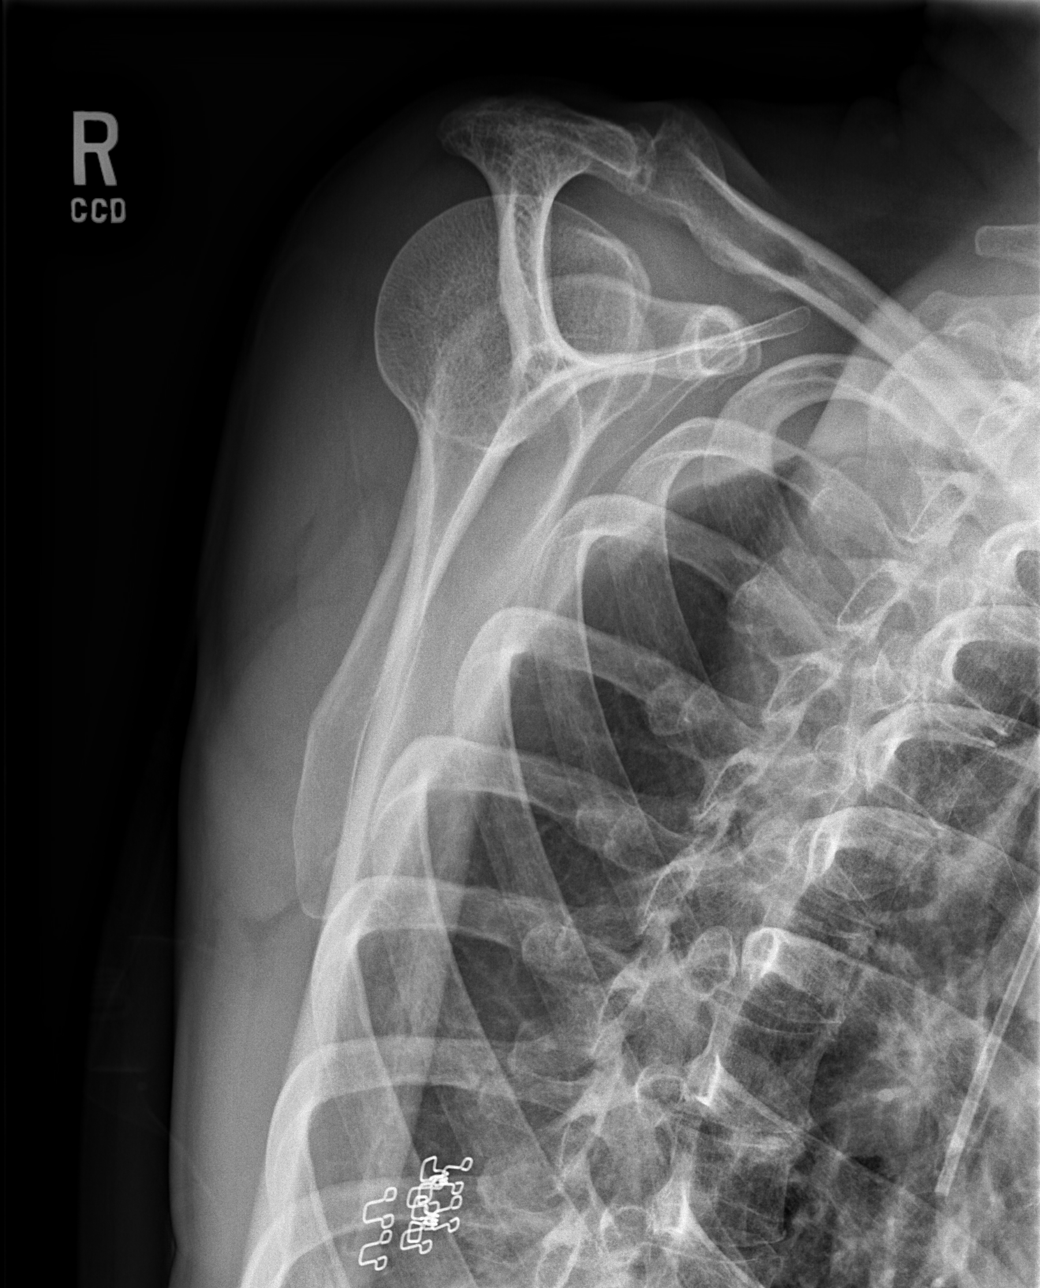

[w shoulder internal right]
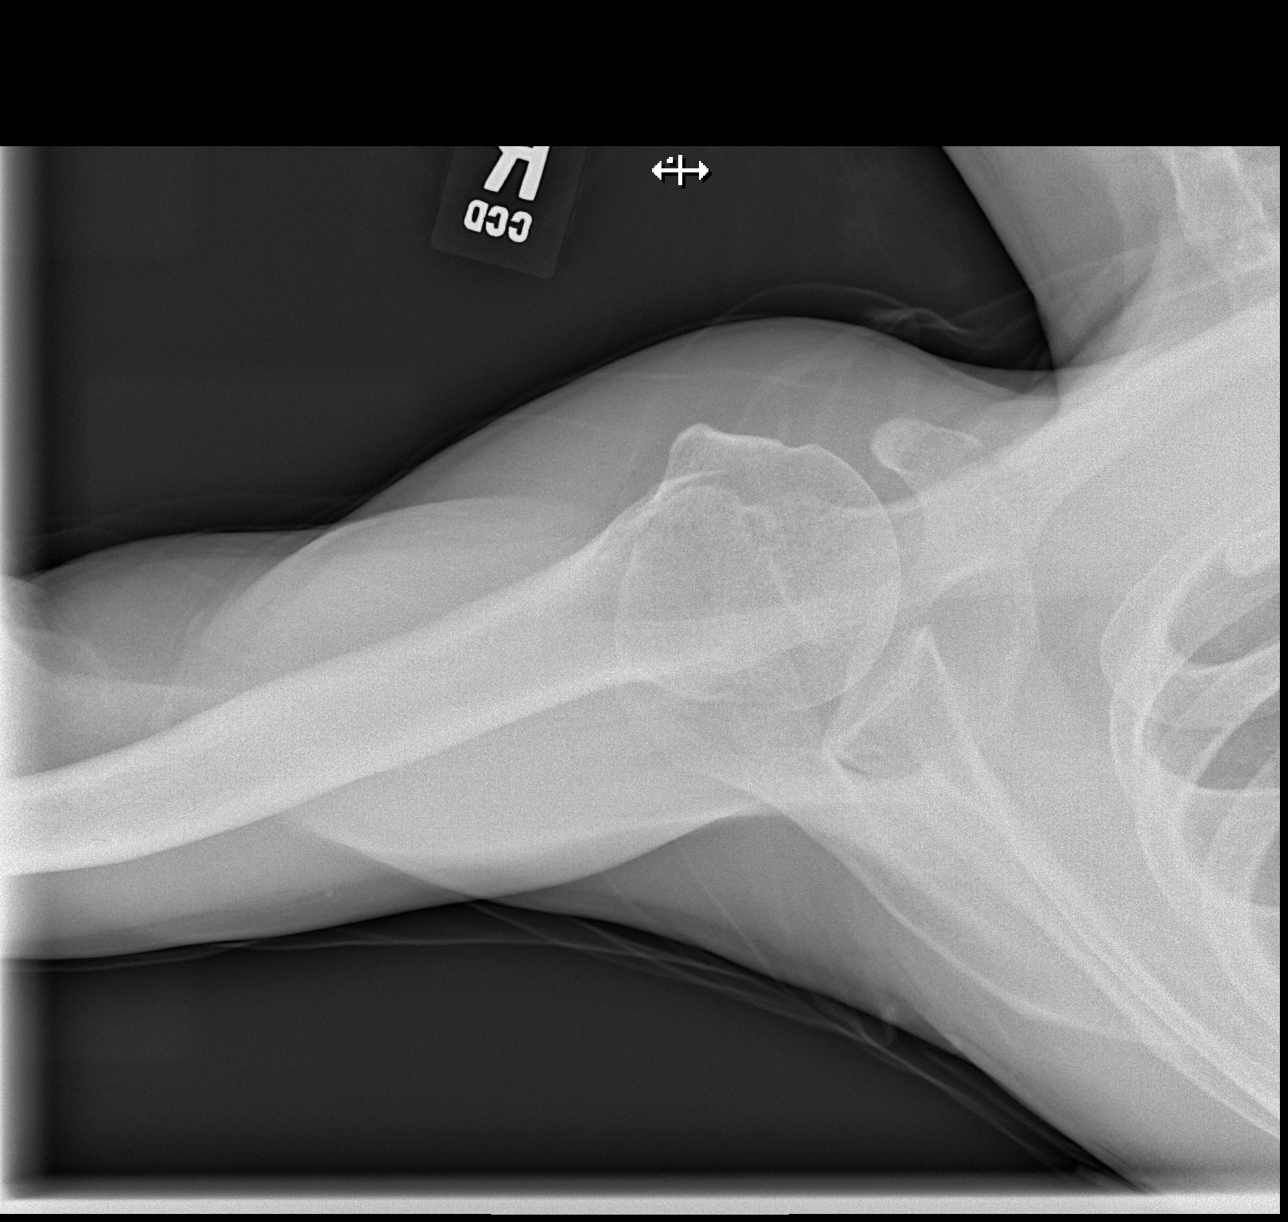

[3 of 3 positions shown; findings below may reference images not displayed]

FINDINGS: There is no evidence of fracture or dislocation. Mild
acromioclavicular degenerative change is noted. Soft tissues are
unremarkable.
IMPRESSION: No acute abnormality.  Mild acromioclavicular degenerative change.

## 2017-10-10 ENCOUNTER — Other Ambulatory Visit: Payer: Self-pay | Admitting: Infectious Diseases

## 2017-10-10 DIAGNOSIS — B20 Human immunodeficiency virus [HIV] disease: Secondary | ICD-10-CM

## 2017-10-11 ENCOUNTER — Other Ambulatory Visit: Payer: Self-pay | Admitting: *Deleted

## 2017-10-18 ENCOUNTER — Ambulatory Visit: Payer: Medicaid Other

## 2017-11-04 ENCOUNTER — Other Ambulatory Visit: Payer: Self-pay | Admitting: Neurology

## 2017-11-19 ENCOUNTER — Telehealth: Payer: Self-pay | Admitting: Neurology

## 2017-11-19 NOTE — Telephone Encounter (Signed)
Pt called and needs a prescription for gabapentin called in

## 2017-11-19 NOTE — Telephone Encounter (Signed)
Left message for patient to call me back. 

## 2017-11-20 NOTE — Telephone Encounter (Signed)
Last note states that patient was being taken off gabapentin.  Left message for her to call me back.

## 2017-11-23 ENCOUNTER — Other Ambulatory Visit: Payer: Self-pay | Admitting: Neurology

## 2017-12-09 ENCOUNTER — Encounter (HOSPITAL_BASED_OUTPATIENT_CLINIC_OR_DEPARTMENT_OTHER): Payer: Self-pay

## 2017-12-09 ENCOUNTER — Other Ambulatory Visit: Payer: Self-pay

## 2017-12-09 ENCOUNTER — Emergency Department (HOSPITAL_BASED_OUTPATIENT_CLINIC_OR_DEPARTMENT_OTHER)
Admission: EM | Admit: 2017-12-09 | Discharge: 2017-12-09 | Disposition: A | Discharge status: Home / Self Care | Payer: Medicaid Other | Attending: Emergency Medicine | Admitting: Emergency Medicine

## 2017-12-09 DIAGNOSIS — R51 Headache: Secondary | ICD-10-CM

## 2017-12-09 DIAGNOSIS — B20 Human immunodeficiency virus [HIV] disease: Secondary | ICD-10-CM | POA: Insufficient documentation

## 2017-12-09 DIAGNOSIS — F17210 Nicotine dependence, cigarettes, uncomplicated: Secondary | ICD-10-CM | POA: Diagnosis not present

## 2017-12-09 DIAGNOSIS — R519 Headache, unspecified: Secondary | ICD-10-CM

## 2017-12-09 DIAGNOSIS — Z8521 Personal history of malignant neoplasm of larynx: Secondary | ICD-10-CM | POA: Insufficient documentation

## 2017-12-09 DIAGNOSIS — L989 Disorder of the skin and subcutaneous tissue, unspecified: Secondary | ICD-10-CM | POA: Diagnosis present

## 2017-12-09 NOTE — ED Provider Notes (Signed)
Emergency Department Provider Note   I have reviewed the triage vital signs and the nursing notes.   HISTORY  Chief Complaint Head Injury   HPI [name] is a 51 y.o. female since to the emergency department for evaluation of sudden sharp pain and swelling in the back of the head.  This began yesterday with no obvious injury.  Patient states it felt sharp like something stung her shot her in the back of the head.  She denies any head trauma.  She reports intermittent sharp pains in the back of the head.  She has not been taking any over-the-counter pain medication.  No radiation of symptoms.  No vision changes.  No nausea or vomiting.   Past Medical History:  Diagnosis Date  . Alcohol abuse   . Anemia   . Anxiety   . 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 HCV (hepatitis C virus)    chronic , RNA negative 2016, never treated  . HIV (human immunodeficiency virus infection) [facility])     Patient Active Problem List   Diagnosis Date Noted  . 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 DOE (dyspnea on exertion) 06/05/2016  . HIV disease ([place]) 03/08/2016  . Hepatitis C without hepatic coma 03/08/2016  . Throat pain in adult 11/23/2015  . 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 CESAREAN SECTION     x 3  . IR REMOVAL TUN ACCESS W/ PORT W/O FL MOD SED  08/09/2017  . LARYNGOSCOPY  12/31/14    Current Outpatient Rx  . Order #: [id number] Class: Normal  . Order #: [id number] Class: Historical Med  . Order #: [id number] Class: Normal  . Order #: [id number] Class: Historical Med  . Order #: [id number] Class: Historical Med  . Order #: [id number] Class: Historical Med  . Order #: [id number] Class: Normal  . Order #: [id number] Class: Normal  . Order #: [id number] Class: Historical Med  . Order #: [id number] Class: Historical Med  . Order #: [id number] Class: Normal     Allergies Ancef [cefazolin]; Ciprofloxacin; Penicillin g; and Wasp venom  Family History  Problem Relation Age of Onset  . Stomach cancer Sister   . Heart disease Paternal Grandfather   . Alzheimer's disease Paternal Grandmother   . Heart disease Maternal Grandmother   . Stroke Maternal Grandmother   . Other Mother        part of intestines taken out  . Dementia Mother   . Other Father        trauma    Social History Social History   Tobacco Use  . Smoking status: Current Some Day Smoker    Packs/day: 0.10    Years: 32.00    Pack years: 3.20    Types: Cigarettes    Start date: 01/13/1986  . Smokeless tobacco: Never Used  . Tobacco comment: occassional cigarette  Substance Use Topics  . Alcohol use: No    Alcohol/week: 0.0 oz    Comment: Sober since 2012  . Drug use: No    Review of Systems  Constitutional: No fever/chills Eyes: No visual changes. ENT: No sore throat. Cardiovascular: Denies chest pain. Respiratory: Denies shortness of breath. Gastrointestinal: No abdominal pain.  No nausea, no vomiting.  No diarrhea.  No constipation. Genitourinary: Negative for dysuria. Musculoskeletal: Negative for back pain. Skin: Negative for rash. Neurological: Negative for focal weakness or numbness. Positive HA.   10-point ROS otherwise  negative.  ____________________________________________   PHYSICAL EXAM:  VITAL SIGNS: ED Triage Vitals [12/09/17 1444]  Enc Vitals Group     BP 125/81     Pulse Rate 76     Resp 20     Temp 98.2 F (36.8 C)     Temp Source Oral     SpO2 98 %     Weight 140 lb (63.5 kg)     Height 5\' 7"  (1.702 m)     Pain Score 8    Constitutional: Alert and oriented. Well appearing and in no acute distress. Eyes: Conjunctivae are normal. PERRL. EOMI. Head: Atraumatic. Small area of swelling over the posterior scalp.  Nose: No congestion/rhinnorhea. Mouth/Throat: Mucous membranes are moist.   Neck: No stridor.   Cardiovascular:  Normal rate, regular rhythm. Good peripheral circulation. Grossly normal heart sounds.   Respiratory: Normal respiratory effort.  No retractions. Lungs CTAB. Gastrointestinal: Soft and nontender. No distention.  Musculoskeletal: No lower extremity tenderness nor edema. No gross deformities of extremities. Neurologic:  Normal speech and language. No gross focal neurologic deficits are appreciated.  Skin:  Skin is warm, dry and intact. No rash noted. 1 cm lesion over the scalp with no overlying erythema/cellulitis. No fluctuance or drainage. No wound in the area. No ecchymosis.   ____________________________________________  RADIOLOGY  None ____________________________________________   PROCEDURES  Procedure(s) performed:   Procedures  None ____________________________________________   INITIAL IMPRESSION / ASSESSMENT AND PLAN / ED COURSE  Pertinent labs & imaging results that were available during my care of the patient were reviewed by me and considered in my medical decision making (see chart for details).  Presents to the emergency department for evaluation of sudden stinging pain in the back of the head.  There is a small, 1 cm, area of swelling without erythema, laceration, puncture. No evidence to suggest infection process/abscess. No indication for CT imaging at this time.  Advised warm compress and Tylenol Motrin for pain.   At this time, I do not feel there is any life-threatening condition present. I have reviewed and discussed all results (EKG, imaging, lab, urine as appropriate), exam findings with patient. I have reviewed nursing notes and appropriate previous records.  I feel the patient is safe to be discharged home without further emergent workup. Discussed usual and customary return precautions. Patient and family (if present) verbalize understanding and are comfortable with this plan.  Patient will follow-up with their primary care provider. If they do not have a  primary care provider, information for follow-up has been provided to them. All questions have been answered.  ____________________________________________  FINAL CLINICAL IMPRESSION(S) / ED DIAGNOSES  Final diagnoses:  Scalp pain    Note:  This document was prepared using Dragon voice recognition software and may include unintentional dictation errors.  Nanda Quinton, MD Emergency Medicine    Long, [facility], MD 12/09/17 [phone]
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
## 2017-12-09 NOTE — ED Notes (Signed)
Pt denies N/V, vision changes, dizziness

## 2017-12-09 NOTE — ED Notes (Signed)
ED Provider at bedside. 

## 2017-12-09 NOTE — Discharge Instructions (Signed)
Apply a warm compress to the scalp and take Tylenol and/or Motrin for pain.

## 2017-12-31 ENCOUNTER — Ambulatory Visit: Payer: Medicaid Other | Admitting: Infectious Diseases

## 2018-01-01 ENCOUNTER — Encounter: Payer: Medicaid Other | Admitting: Psychology

## 2018-01-15 ENCOUNTER — Encounter: Payer: Medicaid Other | Admitting: Psychology

## 2018-06-26 ENCOUNTER — Other Ambulatory Visit (HOSPITAL_COMMUNITY)
Admission: RE | Admit: 2018-06-26 | Discharge: 2018-06-26 | Disposition: A | Discharge status: Home / Self Care | Payer: Medicaid Other | Source: Ambulatory Visit | Attending: Infectious Diseases | Admitting: Infectious Diseases

## 2018-06-26 ENCOUNTER — Ambulatory Visit (INDEPENDENT_AMBULATORY_CARE_PROVIDER_SITE_OTHER): Payer: Medicaid Other | Admitting: Infectious Diseases

## 2018-06-26 ENCOUNTER — Encounter: Payer: Self-pay | Admitting: Infectious Diseases

## 2018-06-26 VITALS — BP 120/72 | HR 76 | Temp 97.8°F | Ht 67.0 in | Wt 144.0 lb

## 2018-06-26 DIAGNOSIS — B20 Human immunodeficiency virus [HIV] disease: Secondary | ICD-10-CM

## 2018-06-26 DIAGNOSIS — Z113 Encounter for screening for infections with a predominantly sexual mode of transmission: Secondary | ICD-10-CM | POA: Insufficient documentation

## 2018-06-26 DIAGNOSIS — C321 Malignant neoplasm of supraglottis: Secondary | ICD-10-CM

## 2018-06-26 DIAGNOSIS — Z79899 Other long term (current) drug therapy: Secondary | ICD-10-CM | POA: Diagnosis not present

## 2018-06-26 NOTE — Assessment & Plan Note (Signed)
She appears she to be doing well Will f/u with [facility].

## 2018-06-26 NOTE — Progress Notes (Signed)
Mammography scholarship paper work filled out by patient and faxed to [place]. Pricilla Riffle RN

## 2018-06-26 NOTE — Progress Notes (Signed)
   Subjective:    Patient ID: [name], female    DOB: 1966-02-21, 52 y.o.   MRN: [id number]  HPI 52yo F with hx of T3 N2 supraglottic cancer. CTX 2016 per pt. She had XRT (10-2015), resumed XRT Jan 2018 (ended ?) .  She is also HIV+ since 1999, and has been followed at [facility]- on complera --> odefsy.  She also has a hx of Hep C. Her Hep C VL was negative 12-2014 nad 02-2016. She also has a hx of ETOH abuse. Quit 2013.    Worked part time as Scientist, water quality. Busy with grandchildren (now 56 of them).  Has had weakness in her LUE (wears brace), still with problems with short term memory loss.  Has 1 cig every other day.  Has lesion in her neck that ENT is evaluating.  Asks about getting thyroid testing.   HIV 1 RNA Quant  Date Value  09/26/2017 <20 Copies/mL (H)  09/10/2017 1,350 copies/mL (H)  06/05/2016 444 copies/mL (H)   CD4 T Cell Abs (/uL)  Date Value  09/10/2017 510  12/26/2016 600  06/05/2016 360 (L)    Review of Systems  Constitutional: Negative for appetite change and unexpected weight change.  HENT: Negative for trouble swallowing.   Respiratory: Negative for cough.   Gastrointestinal: Negative for constipation and diarrhea.  Genitourinary: Negative for difficulty urinating.  Musculoskeletal: Positive for back pain.  on regular diet.  Please see HPI. All other systems reviewed and negative.     Objective:   Physical Exam  Constitutional: She appears well-developed and well-nourished.  HENT:  Mouth/Throat: No oropharyngeal exudate.  Eyes: Pupils are equal, round, and reactive to light. EOM are normal.  Neck: Normal range of motion. Neck supple.  Cardiovascular: Normal rate, regular rhythm and normal heart sounds.  Pulmonary/Chest: Effort normal and breath sounds normal.  Abdominal: Soft. Bowel sounds are normal. There is no tenderness. There is no guarding.  Lymphadenopathy:    She has no cervical adenopathy.    She has no axillary adenopathy.       Right: No  supraclavicular adenopathy present.       Left: No supraclavicular adenopathy present.  Fullness in R [place] fossa but no distinct mass.        Assessment & Plan:

## 2018-06-26 NOTE — Assessment & Plan Note (Signed)
She appears to be doing well She defers Prevnar Offered/refused condoms.  Will set her up for mammo and pap Check labs today rtc in 6 months

## 2018-06-27 LAB — COMPREHENSIVE METABOLIC PANEL
AG Ratio: 1.6 (calc) (ref 1.0–2.5)
ALT: 11 U/L (ref 6–29)
AST: 17 U/L (ref 10–35)
Albumin: 4.1 g/dL (ref 3.6–5.1)
Alkaline phosphatase (APISO): 72 U/L (ref 33–130)
BUN: 9 mg/dL (ref 7–25)
CO2: 28 mmol/L (ref 20–32)
CREATININE: 0.8 mg/dL (ref 0.50–1.05)
Calcium: 9.6 mg/dL (ref 8.6–10.4)
Chloride: 104 mmol/L (ref 98–110)
Globulin: 2.6 g/dL (calc) (ref 1.9–3.7)
Glucose, Bld: 80 mg/dL (ref 65–99)
Potassium: 3.9 mmol/L (ref 3.5–5.3)
SODIUM: 139 mmol/L (ref 135–146)
TOTAL PROTEIN: 6.7 g/dL (ref 6.1–8.1)
Total Bilirubin: 0.3 mg/dL (ref 0.2–1.2)

## 2018-06-27 LAB — RPR: RPR Ser Ql: NONREACTIVE

## 2018-06-27 LAB — CBC
HEMATOCRIT: 37.1 % (ref 35.0–45.0)
Hemoglobin: 13.1 g/dL (ref 11.7–15.5)
MCH: 31.8 pg (ref 27.0–33.0)
MCHC: 35.3 g/dL (ref 32.0–36.0)
MCV: 90 fL (ref 80.0–100.0)
MPV: 10.1 fL (ref 7.5–12.5)
PLATELETS: 262 10*3/uL (ref 140–400)
RBC: 4.12 10*6/uL (ref 3.80–5.10)
RDW: 12.7 % (ref 11.0–15.0)
WBC: 4.8 10*3/uL (ref 3.8–10.8)

## 2018-06-27 LAB — LIPID PANEL
CHOL/HDL RATIO: 4.1 (calc) (ref <5.0)
Cholesterol: 227 mg/dL — ABNORMAL HIGH (ref <200)
HDL: 56 mg/dL (ref >50)
LDL CHOLESTEROL (CALC): 151 mg/dL — AB
Non-HDL Cholesterol (Calc): 171 mg/dL (calc) — ABNORMAL HIGH (ref <130)
Triglycerides: 92 mg/dL (ref <150)

## 2018-06-27 LAB — URINE CYTOLOGY ANCILLARY ONLY
Chlamydia: NEGATIVE
Neisseria Gonorrhea: NEGATIVE

## 2018-06-27 LAB — TSH: TSH: 0.94 m[IU]/L

## 2018-06-28 LAB — HIV-1 RNA QUANT-NO REFLEX-BLD
HIV 1 RNA QUANT: DETECTED {copies}/mL — AB
HIV-1 RNA QUANT, LOG: DETECTED {Log_copies}/mL — AB

## 2018-07-01 ENCOUNTER — Other Ambulatory Visit: Payer: Self-pay | Admitting: Infectious Diseases

## 2018-07-01 DIAGNOSIS — Z1231 Encounter for screening mammogram for malignant neoplasm of breast: Secondary | ICD-10-CM

## 2018-07-09 ENCOUNTER — Ambulatory Visit: Payer: Medicaid Other | Admitting: Infectious Diseases

## 2018-07-12 ENCOUNTER — Encounter: Payer: Self-pay | Admitting: Neurology

## 2018-07-12 ENCOUNTER — Ambulatory Visit: Payer: Medicaid Other | Admitting: Neurology

## 2018-07-12 VITALS — BP 110/80 | HR 81 | Ht 67.0 in | Wt 138.2 lb

## 2018-07-12 DIAGNOSIS — G44229 Chronic tension-type headache, not intractable: Secondary | ICD-10-CM | POA: Diagnosis not present

## 2018-07-12 DIAGNOSIS — Z720 Tobacco use: Secondary | ICD-10-CM

## 2018-07-12 DIAGNOSIS — G3184 Mild cognitive impairment, so stated: Secondary | ICD-10-CM | POA: Diagnosis not present

## 2018-07-12 MED ORDER — NORTRIPTYLINE HCL 10 MG PO CAPS: ORAL_CAPSULE | ORAL | 11 refills | Status: DC

## 2018-07-12 NOTE — Patient Instructions (Signed)
Start nortriptyline 10mg  at bedtime for 2 week, then increase to 2 tablet at bedtime  Return to clinic as needed

## 2018-07-12 NOTE — Progress Notes (Signed)
Follow-up Visit   Date: 07/12/18    [name] MRN: [id number] DOB: 06-12-1966   Interim History: [name] is a 52 y.o. 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  The patient was accompanied to the clinic by self.  History of present illness: Starting 2015, she began complains of bilateral lower leg tingling sensation, which lasts for a few seconds.  She denies numbness of the feet.  Symptoms are sporadic, lasting a few seconds and occurs daily.  She complains of imbalance and has started taking yoga class to help. There is mild weakness of the left leg.  She also complains of tingling over the scalp and neck which occurred once over the last month.  She is sober since 2012, and previously used to drink liquor daily x 35 years.  She denies any tingling of the hands.  There was no change in her symptoms even after chemotherapy was stopped.   Since having chemotherapy, she began noticing increased forgetfulness, especially with names and makes a list for everything. She misplaces items frequently.  She does not repeat things frequently.  She denies getting lost.   She manages her own IADLs such as driving, manages her finances, and household chores.  Sometimes she forgets taking her medications and does not use a pillbox. She endorses difficulty with word-findings and following conversations.  Her mother and paternal grandmother both had Alzheimer's dementia.  UPDATE 08/13/2017:  She is here because of worsening headaches.  She complains of intermittent stinging and sharp pain which occurs daily, lasting about 10-30 seconds.  Pain can vary in location.  She has not noticed anything that improves her headaches.  She takes gabapentin 300mg  TID which helps but it would make her  too sleepy.  She also takes oxycodone 5mg  daily as needed for tendonitis.  She also complains of having "nerves" not working.  When asked to elaborate, she reports being shaky, restless.  Her leg paresthesias have essentially resolved.  She is worried about her progressive memory problems and being forgetful.  She did not go for MRI brain which was ordered at her last visit.    UPDATE 07/12/2018:   She is here for follow-up visit.  She continues to have headaches daily, described as throbbing over the forehead.  Pain usually lasts a few seconds - minutes.  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  She drives and manages her own finances.  She continues to be forgetful, especially with short-term tasks. She has missed her appointments for neuropsychological testing and MRI brain.  She also complains of generalized musculoskeletal pain, especially left shoulder and elbows.   Medications:  Current Outpatient Medications on File Prior to Visit  Medication Sig Dispense Refill  . albuterol (PROVENTIL HFA;VENTOLIN HFA) 108 (90 Base) MCG/ACT inhaler Inhale 2 puffs into the lungs every 6 (six) hours as needed for wheezing or shortness of breath. 1 Inhaler 6  . ascorbic acid (VITAMIN C) 500 MG tablet Take 500 mg by mouth daily.    Marland Kitchen gabapentin (NEURONTIN) 300 MG capsule Take one capsule 3 x a day. (Patient not taking: Reported on 06/26/2018) 90 capsule 5  . ibuprofen (ADVIL,MOTRIN) 200 MG tablet Take 600 mg by mouth every 6 (six) hours as needed for fever or moderate pain. Reported on 06/05/2016    .  meloxicam (MOBIC) 15 MG tablet TK 1 T PO QD  1  . NICOTINE STEP 3 7 MG/24HR patch APP 1 PA EXT TO THE SKIN D WITH THE EVE MEAL  1  . nortriptyline (PAMELOR) 10 MG capsule Start nortriptyline 10mg  at bedtime for 2 week, then increase to 2 tablet at bedtime (Patient not taking: Reported on 09/26/2017) 60 capsule 5  . ODEFSEY 200-25-25 MG TABS tablet TAKE 1 TABLET BY MOUTH DAILY, WITH BREAKFAST 30 tablet 5  .  oxyCODONE (ROXICODONE) 5 MG immediate release tablet Take 5 mg by mouth every 6 (six) hours as needed for severe pain.     Marland Kitchen oxyCODONE-acetaminophen (PERCOCET/ROXICET) 5-325 MG tablet TK 1 T PO QAM PRN  0   No current facility-administered medications on file prior to visit.     Allergies:  Allergies  Allergen Reactions  . Ancef [Cefazolin] Hives  . Ciprofloxacin Hives  . Penicillin G Rash    Pt states "some kind of Penicillin. Not sure what kind" causes her to break out in rash on chest  . Wasp Venom Swelling    Facial and cranial swelling    Review of Systems:  CONSTITUTIONAL: No fevers, chills, night sweats, or weight loss.  EYES: No visual changes or eye pain ENT: No hearing changes.  No history of nose bleeds.   RESPIRATORY: No cough, wheezing and shortness of breath.   CARDIOVASCULAR: Negative for chest pain, and palpitations.   GI: Negative for abdominal discomfort, blood in stools or black stools.  No recent change in bowel habits.   GU:  No history of incontinence.   MUSCLOSKELETAL: No history of joint pain or swelling.  No myalgias.   SKIN: Negative for lesions, rash, and itching.   ENDOCRINE: Negative for cold or heat intolerance, polydipsia or goiter.   PSYCH:  No depression +anxiety symptoms.   NEURO: As Above.   Vital Signs:  There were no vitals taken for this visit.  General Medical Exam:   General:  Well appearing, comfortable  Eyes/ENT: see cranial nerve examination.   Neck: No masses appreciated.  Full range of motion without tenderness.  No carotid bruits. Respiratory:  Clear to auscultation, good air entry bilaterally.   Cardiac:  Regular rate and rhythm, no murmur.   Ext:  No edema  Neurological Exam: MENTAL STATUS including orientation to time, place, person, recent and remote memory, attention span and concentration, language, and fund of knowledge is fair.   Montreal Cognitive Assessment  07/12/2018 11/15/2016  Visuospatial/ Executive (0/5) 5 5    Naming (0/3) 3 2  Attention: Read list of digits (0/2) 2 2  Attention: Read list of letters (0/1) 1 1  Attention: Serial 7 subtraction starting at 100 (0/3) 1 3  Language: Repeat phrase (0/2) 2 2  Language : Fluency (0/1) 1 1  Abstraction (0/2) 1 2  Delayed Recall (0/5) 2 1  Orientation (0/6) 5 6  Total 23 25  Adjusted Score (based on education) 24 26    CRANIAL NERVES: No visual field defects. Pupils equal round and reactive to light.  Normal conjugate, extra-ocular eye movements in all directions of gaze.  No ptosis. Face is symmetric. Palate elevates symmetrically.  Tongue is midline.  MOTOR:  Motor strength is 5/5 in all extremities.  No pronator drift.  Tone is normal.    MSRs:  Reflexes are 2+/4 throughout.  SENSORY:  Intact to vibration.  COORDINATION/GAIT:  Slowed finger tapping, inconsistent. Finger to nose shows mild dysmetria, also inconsistent.  Gait narrow based and stable.   Data: NCS/EMG of the legs 12/28/2016:  Normal  Lab Results  Component Value Date   [id number] 428 11/15/2016   Lab Results  Component Value Date   TSH 0.94 06/26/2018   MRI lumbar spine wo contrast 10/11/2016: 1. At L2-3 there is a mild broad-based disc bulge and mild left lateral recess stenosis. 2. At L3-4 there is a broad-based disc bulge. 3. At L4-5 there is a broad-based disc bulge with mild right foraminal stenosis and mild spinal stenosis. 4. At L5-S1 there is a broad-based disc bulge and mild bilateral facet arthropathy.   IMPRESSION/PLAN: 1.  Chronic tension headaches.  Start nortriptyline 10mg  at bedtime for 2 week, then increase to 2 tablet at bedtime  2.  Multifactorial cognitive impairment. She scored 24/30 on cognitive testing with deficits in delayed recall and attention.  Her cognitive dysfunction is most likely due to a combination of side effects from chemotherapy, medical comorbidities of HIV, and long history of alcoholism, and possible mood disorder.  I have  referred her for neuropsychological testing in the past which she cancelled.  MRI brain has also been ordered twice and not performed.  We discussed repeating MRI brain wwo contrast given her history of cancer, but she has missed these appointments in the past and prefers to hold on imaging at this time  3.  Tobacco with history cancer.  She is smoking 2-5 cigarettes daily.  Patient was informed of the dangers of tobacco abuse including stroke, cancer, and MI, as well as benefits of tobacco cessation.  Patient is willing to quit at this time.  Approximately 5 mins were spent counseling patient cessation techniques. We discussed various methods to help quit smoking, including deciding on a date to quit, joining a support group, pharmacological agents- nicotine gum/patch/lozenges, chantix. I will reassess her progress at the next follow-up visit  Return to clinic as needed  Greater than 50% of this 30 minute visit was spent in counseling, explanation of diagnosis, planning of further management, and coordination of care.    Thank you for allowing me to participate in patient's care.  If I can answer any additional questions, I would be pleased to do so.    Sincerely,    Donika K. Posey Pronto, DO

## 2018-07-26 ENCOUNTER — Ambulatory Visit: Payer: Medicaid Other

## 2018-07-30 ENCOUNTER — Other Ambulatory Visit: Payer: Self-pay | Admitting: Infectious Diseases

## 2018-07-30 DIAGNOSIS — B20 Human immunodeficiency virus [HIV] disease: Secondary | ICD-10-CM

## 2018-08-21 ENCOUNTER — Ambulatory Visit: Payer: Medicaid Other

## 2018-08-27 ENCOUNTER — Encounter: Payer: Self-pay | Admitting: Infectious Diseases

## 2018-10-07 ENCOUNTER — Encounter: Payer: Self-pay | Admitting: Neurology

## 2018-10-07 ENCOUNTER — Ambulatory Visit: Payer: Medicaid Other | Admitting: Neurology

## 2018-10-07 VITALS — BP 110/80 | HR 82 | Ht 67.0 in | Wt 141.4 lb

## 2018-10-07 DIAGNOSIS — R51 Headache: Secondary | ICD-10-CM | POA: Diagnosis not present

## 2018-10-07 DIAGNOSIS — G44229 Chronic tension-type headache, not intractable: Secondary | ICD-10-CM | POA: Diagnosis not present

## 2018-10-07 DIAGNOSIS — R519 Headache, unspecified: Secondary | ICD-10-CM

## 2018-10-07 DIAGNOSIS — G3184 Mild cognitive impairment, so stated: Secondary | ICD-10-CM

## 2018-10-07 NOTE — Addendum Note (Signed)
Addended by: Chester Holstein on: 10/07/2018 10:54 AM   Modules accepted: Orders

## 2018-10-07 NOTE — Progress Notes (Signed)
Follow-up Visit   Date: 10/07/18    [name] MRN: [id number] DOB: 06/28/1966   Interim History: [name] is a 52 y.o. 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  The patient was accompanied to the clinic by self.  History of present illness: Starting 2015, she began complains of bilateral lower leg tingling sensation, which lasts for a few seconds.  She denies numbness of the feet.  Symptoms are sporadic, lasting a few seconds and occurs daily.  She complains of imbalance and has started taking yoga class to help. There is mild weakness of the left leg.  She also complains of tingling over the scalp and neck which occurred once over the last month.  She is sober since 2012, and previously used to drink liquor daily x 35 years.  She denies any tingling of the hands.  There was no change in her symptoms even after chemotherapy was stopped.   Since having chemotherapy, she began noticing increased forgetfulness, especially with names and makes a list for everything. She misplaces items frequently.  She manages her own IADLs such as driving, manages her finances, and household chores.  Sometimes she forgets taking her medications and does not use a pillbox. She endorses difficulty with word-findings and following conversations.  Her mother and paternal grandmother both had Alzheimer's dementia.  She has missed her appointments for neuropsychological testing and MRI brain in the past.   UPDATE 10/07/2018:  She is here today with worsening headaches and memory complaints.  A her last visit, she was started on nortriptyline 10mg  at bedtime, which helps her sleep but does not control headaches.  She is unable to titrate higher due to side effects of dizziness.  She continues to have  episodic pulsating sharp pain of the head.  She has tried Lyrica, gabapentin, and Cymbalta in the past.  She takes tylenol, NSAIDs daily for headaches in addition to oxycodone daily for chronic pain.  She will be transitioning care to [facility] for PCP and pain management.  She continues to complain of problems with forgetfulness and concentration.  At her last visit, she did not want to proceed with MRI brain or neuropsychological testing; prior to this she cancelled her visits.  Today, she would like to be tested again.   Medications:  Current Outpatient Medications on File Prior to Visit  Medication Sig Dispense Refill  . albuterol (PROVENTIL HFA;VENTOLIN HFA) 108 (90 Base) MCG/ACT inhaler Inhale 2 puffs into the lungs every 6 (six) hours as needed for wheezing or shortness of breath. 1 Inhaler 6  . diclofenac sodium (VOLTAREN) 1 % GEL Place onto the skin.    Marland Kitchen ibuprofen (ADVIL,MOTRIN) 200 MG tablet Take 600 mg by mouth every 6 (six) hours as needed for fever or moderate pain. Reported on 06/05/2016    . nortriptyline (PAMELOR) 10 MG capsule Start nortriptyline 10mg  at bedtime for 2 week, then increase to 2 tablet at bedtime 60 capsule 11  . ODEFSEY 200-25-25 MG TABS tablet TAKE 1 TABLET BY MOUTH DAILY, WITH BREAKFAST 30 tablet 5  . oxyCODONE (ROXICODONE) 5 MG immediate release tablet Take 5 mg by mouth every 6 (six) hours as needed for severe pain.     Marland Kitchen oxyCODONE-acetaminophen (PERCOCET/ROXICET) 5-325 MG tablet TK 1 T PO QAM PRN  0  . pregabalin (LYRICA)  200 MG capsule TK ONE C PO BID    . ranitidine (ZANTAC) 150 MG tablet Take by mouth.    . vitamin B-12 (CYANOCOBALAMIN) 1000 MCG tablet Take by mouth.     No current facility-administered medications on file prior to visit.     Allergies:  Allergies  Allergen Reactions  . Ancef [Cefazolin] Hives  . Ciprofloxacin Hives  . Penicillin G Rash    Pt states "some kind of Penicillin. Not sure what kind" causes her to break out in  rash on chest  . Wasp Venom Swelling    Facial and cranial swelling    Review of Systems:  CONSTITUTIONAL: No fevers, chills, night sweats, or weight loss.  EYES: No visual changes or eye pain ENT: No hearing changes.  No history of nose bleeds.   RESPIRATORY: No cough, wheezing and shortness of breath.   CARDIOVASCULAR: Negative for chest pain, and palpitations.   GI: Negative for abdominal discomfort, blood in stools or black stools.  No recent change in bowel habits.   GU:  No history of incontinence.   MUSCLOSKELETAL: No history of joint pain or swelling.  No myalgias.   SKIN: Negative for lesions, rash, and itching.   ENDOCRINE: Negative for cold or heat intolerance, polydipsia or goiter.   PSYCH:  No depression +anxiety symptoms.   NEURO: As Above.   Vital Signs:  BP 110/80   Pulse 82   Ht 5\' 7"  (1.702 m)   Wt 141 lb 6 oz (64.1 kg)   SpO2 98%   BMI 22.14 kg/m   Neurological Exam: MENTAL STATUS including orientation to time, place, person is intact.  She is slow to process information and respond.  Poor attention and concentration.  Montreal Cognitive Assessment  10/07/2018 07/12/2018 11/15/2016  Visuospatial/ Executive (0/5) 5 5 5   Naming (0/3) 3 3 2   Attention: Read list of digits (0/2) 2 2 2   Attention: Read list of letters (0/1) 1 1 1   Attention: Serial 7 subtraction starting at 100 (0/3) 1 1 3   Language: Repeat phrase (0/2) 2 2 2   Language : Fluency (0/1) 1 1 1   Abstraction (0/2) 1 1 2   Delayed Recall (0/5) 1 2 1   Orientation (0/6) 5 5 6   Total 22 23 25   Adjusted Score (based on education) 23 24 26     CRANIAL NERVES:  Face is symmetric.   MOTOR:  Motor strength is 5/5 throughout  COORDINATION/GAIT:    Gait narrow based and stable.   Data: NCS/EMG of the legs 12/28/2016:  Normal  Lab Results  Component Value Date   [id number] 096 11/15/2016   Lab Results  Component Value Date   TSH 0.94 06/26/2018   MRI lumbar spine wo contrast 10/11/2016: 1. At  L2-3 there is a mild broad-based disc bulge and mild left lateral recess stenosis. 2. At L3-4 there is a broad-based disc bulge. 3. At L4-5 there is a broad-based disc bulge with mild right foraminal stenosis and mild spinal stenosis. 4. At L5-S1 there is a broad-based disc bulge and mild bilateral facet arthropathy.   IMPRESSION/PLAN: 1.  Multifactorial cognitive impairment due to a combination of side effects from chemotherapy, medical comorbidities of HIV, and long history of alcoholism, and possible mood disorder.  She has cancelled neuropsychological testing and MRI brain in the past.  Today, she would like to have testing, so I will order these studies again. She was urged to follow through with tested as recommended, so I can better offer  recommendations in her care.   2.  Medication overuse headaches.  She is treating her headaches with daily tylenol/NSAIDs and I have asked her to limit this to twice per week.  Her headaches will not improve, until she is on less medication.  3.  Primary stabbing headaches.  She had previously tried Lyrica, gabapentin, nortriptyline, Cymbalta. We discussed trying newer injectable options, but given that part of her headache syndrome is medication overuse, would only consider this once she is on less daily rescue medication.  Continue nortriptyline 10mg  at bedtime. She will be establishing care with pain management going forward.   Greater than 50% of this 25 minute visit was spent in counseling, explanation of diagnosis, planning of further management, and coordination of care.    Thank you for allowing me to participate in patient's care.  If I can answer any additional questions, I would be pleased to do so.    Sincerely,    Donika K. Posey Pronto, DO

## 2018-10-07 NOTE — Patient Instructions (Signed)
We will order MRI brain and refer you for cognitive testing  Please follow-up with your pain management provider for headache management

## 2018-12-23 ENCOUNTER — Other Ambulatory Visit: Payer: Medicaid Other

## 2018-12-29 ENCOUNTER — Other Ambulatory Visit: Payer: Self-pay

## 2018-12-31 ENCOUNTER — Telehealth: Payer: Self-pay | Admitting: Neurology

## 2018-12-31 NOTE — Telephone Encounter (Signed)
-----   Message from Chester Holstein, LPN sent at [phone]  8:53 AM EST ----- Juluis Rainier. ----- Message ----- From: Merril Abbe, CCC-SLP Sent: 12/31/2018   8:07 AM EST To: Aileen Pilot Driver, LPN  We have left 3 messages for your patient, with no return call. Referral closed.  Thank you.

## 2019-01-06 ENCOUNTER — Encounter: Payer: Medicaid Other | Admitting: Infectious Diseases

## 2019-01-06 ENCOUNTER — Inpatient Hospital Stay: Admission: RE | Admit: 2019-01-06 | Payer: Self-pay | Source: Ambulatory Visit

## 2019-01-07 ENCOUNTER — Encounter: Payer: Self-pay | Admitting: Infectious Diseases

## 2019-01-07 ENCOUNTER — Ambulatory Visit (INDEPENDENT_AMBULATORY_CARE_PROVIDER_SITE_OTHER): Payer: Medicaid Other | Admitting: Infectious Diseases

## 2019-01-07 VITALS — BP 117/82 | HR 80 | Temp 97.5°F | Wt 136.0 lb

## 2019-01-07 DIAGNOSIS — G4485 Primary stabbing headache: Secondary | ICD-10-CM | POA: Diagnosis not present

## 2019-01-07 DIAGNOSIS — C329 Malignant neoplasm of larynx, unspecified: Secondary | ICD-10-CM

## 2019-01-07 DIAGNOSIS — Z113 Encounter for screening for infections with a predominantly sexual mode of transmission: Secondary | ICD-10-CM

## 2019-01-07 DIAGNOSIS — Z79899 Other long term (current) drug therapy: Secondary | ICD-10-CM

## 2019-01-07 DIAGNOSIS — B20 Human immunodeficiency virus [HIV] disease: Secondary | ICD-10-CM

## 2019-01-07 NOTE — Addendum Note (Signed)
Addended by: Ranya Fiddler C on: 01/07/2019 02:10 PM   Modules accepted: Orders

## 2019-01-07 NOTE — Assessment & Plan Note (Signed)
She appears to be doing well Will need f/u with ENT. If not done by next visit, will set up for her (has been seen by ENT @ [facility])

## 2019-01-07 NOTE — Progress Notes (Signed)
   Subjective:    Patient ID: [name], female    DOB: 20-Feb-1966, 53 y.o.   MRN: [id number]  HPI 53yo F with hx of T2 N2 supraglottic cancer. CTX 2016 per pt. She had XRT (10-2015), resumed XRT Jan 2018 (ended ?). Was followed at [facility], was to get new ENT at [facility].  She is also HIV+ since 1999, and has been followed at [facility]- on complera -->odefsy.  She also has a hx of Hep C. Her Hep C VLwas negative 12-2014 nad 02-2016. She also has a hx of ETOH abuse. Quit 2013.   Worked part time over holidays, looking for work. Busy with grandchildren (now 5 of them with one on the way).  Has not needed any further treatment for her throat cancer.  No problems with ART.   HIV 1 RNA Quant  Date Value  06/26/2018 <20 DETECTED copies/mL (A)  09/26/2017 <20 Copies/mL (H)  09/10/2017 1,350 copies/mL (H)   CD4 T Cell Abs (/uL)  Date Value  09/10/2017 510  12/26/2016 600  06/05/2016 360 (L)    Review of Systems  Constitutional: Negative for appetite change, chills, fever and unexpected weight change.  HENT: Positive for sore throat. Negative for trouble swallowing.   Gastrointestinal: Negative for constipation and diarrhea.  Genitourinary: Negative for difficulty urinating.  Neurological: Positive for headaches.  missed PAP, needs Mammo.  To have MRI for her headaches.  Please see HPI. All other systems reviewed and negative.     Objective:   Physical Exam Constitutional:      Appearance: Normal appearance.  HENT:     Mouth/Throat:     Mouth: Mucous membranes are moist.     Pharynx: No oropharyngeal exudate.  Eyes:     Extraocular Movements: Extraocular movements intact.     Pupils: Pupils are equal, round, and reactive to light.  Neck:     Musculoskeletal: Normal range of motion.  Cardiovascular:     Rate and Rhythm: Normal rate and regular rhythm.  Pulmonary:     Effort: Pulmonary effort is normal.     Breath sounds: Normal breath sounds.  Abdominal:     General:  Bowel sounds are normal. There is no distension.     Palpations: Abdomen is soft.     Tenderness: There is no abdominal tenderness.  Musculoskeletal:        General: No swelling or deformity.     Right lower leg: No edema.     Left lower leg: No edema.  Neurological:     General: No focal deficit present.     Mental Status: She is alert and oriented to person, place, and time.  Psychiatric:        Mood and Affect: Mood normal.   L wrist is non-tender, no swelling.         Assessment & Plan:

## 2019-01-07 NOTE — Assessment & Plan Note (Signed)
Is being w/u, MRI pending.

## 2019-01-07 NOTE — Assessment & Plan Note (Signed)
She is doing well States she has gotten flu vax.  Refuses PCV Will arrange mammo and pap Offered/refused condoms.  rtc in 9 months.

## 2019-01-09 LAB — CBC
HCT: 39.7 % (ref 35.0–45.0)
HEMOGLOBIN: 14.1 g/dL (ref 11.7–15.5)
MCH: 32 pg (ref 27.0–33.0)
MCHC: 35.5 g/dL (ref 32.0–36.0)
MCV: 90.2 fL (ref 80.0–100.0)
MPV: 10.2 fL (ref 7.5–12.5)
Platelets: 312 10*3/uL (ref 140–400)
RBC: 4.4 10*6/uL (ref 3.80–5.10)
RDW: 12.2 % (ref 11.0–15.0)
WBC: 4.6 10*3/uL (ref 3.8–10.8)

## 2019-01-09 LAB — COMPREHENSIVE METABOLIC PANEL
AG RATIO: 1.6 (calc) (ref 1.0–2.5)
ALBUMIN MSPROF: 4.4 g/dL (ref 3.6–5.1)
ALT: 11 U/L (ref 6–29)
AST: 16 U/L (ref 10–35)
Alkaline phosphatase (APISO): 78 U/L (ref 33–130)
BUN / CREAT RATIO: 7 (calc) (ref 6–22)
BUN: 5 mg/dL — ABNORMAL LOW (ref 7–25)
CHLORIDE: 101 mmol/L (ref 98–110)
CO2: 27 mmol/L (ref 20–32)
Calcium: 10 mg/dL (ref 8.6–10.4)
Creat: 0.76 mg/dL (ref 0.50–1.05)
GLUCOSE: 104 mg/dL — AB (ref 65–99)
Globulin: 2.8 g/dL (calc) (ref 1.9–3.7)
POTASSIUM: 4 mmol/L (ref 3.5–5.3)
SODIUM: 139 mmol/L (ref 135–146)
TOTAL PROTEIN: 7.2 g/dL (ref 6.1–8.1)
Total Bilirubin: 0.4 mg/dL (ref 0.2–1.2)

## 2019-01-09 LAB — LIPID PANEL
Cholesterol: 281 mg/dL — ABNORMAL HIGH (ref <200)
HDL: 66 mg/dL (ref >50)
LDL Cholesterol (Calc): 193 mg/dL (calc) — ABNORMAL HIGH
Non-HDL Cholesterol (Calc): 215 mg/dL (calc) — ABNORMAL HIGH (ref <130)
Total CHOL/HDL Ratio: 4.3 (calc) (ref <5.0)
Triglycerides: 97 mg/dL (ref <150)

## 2019-01-09 LAB — RPR: RPR Ser Ql: NONREACTIVE

## 2019-01-09 LAB — HIV-1 RNA QUANT-NO REFLEX-BLD
HIV 1 RNA Quant: <20 copies/mL — AB
HIV-1 RNA QUANT, LOG: DETECTED {Log_copies}/mL — AB

## 2019-01-21 ENCOUNTER — Other Ambulatory Visit: Payer: Self-pay

## 2019-01-27 ENCOUNTER — Ambulatory Visit: Payer: Medicaid Other | Admitting: Infectious Diseases

## 2019-01-28 ENCOUNTER — Telehealth: Payer: Self-pay | Admitting: Neurology

## 2019-01-28 NOTE — Telephone Encounter (Signed)
I have requested another PA so I can reschedule patient.

## 2019-01-28 NOTE — Telephone Encounter (Signed)
Patient states that the MRI place ([facility]) states that we need to reorder the MRI due to patient or call to let them know we approve the MRI to be resch please call patient

## 2019-01-30 ENCOUNTER — Other Ambulatory Visit: Payer: Self-pay | Admitting: Infectious Diseases

## 2019-01-30 ENCOUNTER — Ambulatory Visit
Admission: RE | Admit: 2019-01-30 | Discharge: 2019-01-30 | Disposition: A | Discharge status: Home / Self Care | Payer: Medicaid Other | Source: Ambulatory Visit | Attending: Neurology | Admitting: Neurology

## 2019-01-30 DIAGNOSIS — G3184 Mild cognitive impairment, so stated: Secondary | ICD-10-CM

## 2019-01-30 DIAGNOSIS — Z1231 Encounter for screening mammogram for malignant neoplasm of breast: Secondary | ICD-10-CM

## 2019-01-30 DIAGNOSIS — G44229 Chronic tension-type headache, not intractable: Secondary | ICD-10-CM

## 2019-01-30 DIAGNOSIS — R51 Headache: Secondary | ICD-10-CM

## 2019-01-30 DIAGNOSIS — R519 Headache, unspecified: Secondary | ICD-10-CM

## 2019-01-30 MED ORDER — GADOBENATE DIMEGLUMINE 529 MG/ML IV SOLN
12.0000 mL | Freq: Once | INTRAVENOUS | Status: AC | PRN
  Administered 2019-01-30: 12 mL via INTRAVENOUS

## 2019-02-04 ENCOUNTER — Telehealth: Payer: Self-pay | Admitting: *Deleted

## 2019-02-04 NOTE — Telephone Encounter (Signed)
Left message giving patient results and instructions.   

## 2019-02-04 NOTE — Telephone Encounter (Signed)
-----   Message from Alda Berthold, DO sent at 01/31/2019  4:19 PM EST ----- Please inform patient that her MRI brain is normal.  She has sinus disease and may want to see her PCP, if she is having congestion or headaches related to this.  Thanks.

## 2019-03-03 ENCOUNTER — Ambulatory Visit: Payer: Self-pay

## 2019-04-03 ENCOUNTER — Emergency Department (HOSPITAL_BASED_OUTPATIENT_CLINIC_OR_DEPARTMENT_OTHER)
Admission: EM | Admit: 2019-04-03 | Discharge: 2019-04-03 | Disposition: A | Discharge status: Home / Self Care | Payer: Medicaid Other | Attending: Emergency Medicine | Admitting: Emergency Medicine

## 2019-04-03 ENCOUNTER — Encounter (HOSPITAL_BASED_OUTPATIENT_CLINIC_OR_DEPARTMENT_OTHER): Payer: Self-pay | Admitting: *Deleted

## 2019-04-03 ENCOUNTER — Other Ambulatory Visit: Payer: Self-pay

## 2019-04-03 DIAGNOSIS — F17210 Nicotine dependence, cigarettes, uncomplicated: Secondary | ICD-10-CM | POA: Diagnosis not present

## 2019-04-03 DIAGNOSIS — Z8521 Personal history of malignant neoplasm of larynx: Secondary | ICD-10-CM | POA: Insufficient documentation

## 2019-04-03 DIAGNOSIS — Z79899 Other long term (current) drug therapy: Secondary | ICD-10-CM | POA: Diagnosis not present

## 2019-04-03 DIAGNOSIS — Z21 Asymptomatic human immunodeficiency virus [HIV] infection status: Secondary | ICD-10-CM | POA: Insufficient documentation

## 2019-04-03 DIAGNOSIS — R05 Cough: Secondary | ICD-10-CM | POA: Diagnosis present

## 2019-04-03 DIAGNOSIS — J209 Acute bronchitis, unspecified: Secondary | ICD-10-CM | POA: Insufficient documentation

## 2019-04-03 MED ORDER — DOXYCYCLINE HYCLATE 100 MG PO CAPS: 100.0000 mg | ORAL_CAPSULE | Freq: Two times a day (BID) | ORAL | 0 refills | Status: DC

## 2019-04-03 NOTE — ED Provider Notes (Signed)
[facility] EMERGENCY DEPARTMENT Provider Note   CSN: [id number] Arrival date & time: 04/03/19  1611    History   Chief Complaint Chief Complaint  Patient presents with  . Cough    HPI [name] is a 53 y.o. female.     Patient is a 53 year old female with past medical history of HIV disease, hepatitis C.  She presents today for evaluation of cough, congestion, sore throat, and fever for the past 10 days.  She was seen by telemedicine 1 week ago and prescribed Tamiflu which has not helped.  She denies any chest pain or difficulty breathing.  She denies any exposure to ill contacts or travel.  The history is provided by the patient.  Cough  Cough characteristics:  Non-productive and dry Severity:  Moderate Onset quality:  Gradual Duration:  2 weeks Timing:  Constant Progression:  Worsening Chronicity:  New Smoker: no (Patient does vape.)   Relieved by:  Nothing   Past Medical History:  Diagnosis Date  . Alcohol abuse   . Anemia   . Anxiety   . Cancer ([place]) 12/31/2014   squamous cell of larynx  . HCV (hepatitis C virus)    chronic , RNA negative 2016, never treated  . HIV (human immunodeficiency virus infection) [facility])     Patient Active Problem List   Diagnosis Date Noted  . 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 DOE (dyspnea on exertion) 06/05/2016  . HIV disease ([name]) 03/08/2016  . Hepatitis C without hepatic coma 03/08/2016  . Throat pain in adult 11/23/2015  . Malignant neoplasm of supraglottis ([place]) 08/06/2015  . Laryngeal carcinoma ([place]) 01/14/2015    Past Surgical History:  Procedure Laterality Date  . CESAREAN SECTION     x 3  . IR REMOVAL TUN ACCESS W/ PORT W/O FL MOD SED  08/09/2017  . LARYNGOSCOPY  12/31/14     OB History   No obstetric history on file.      Home Medications    Prior to Admission medications   Medication Sig Start Date End Date Taking? Authorizing Provider  albuterol (PROVENTIL HFA;VENTOLIN HFA) 108  (90 Base) MCG/ACT inhaler Inhale 2 puffs into the lungs every 6 (six) hours as needed for wheezing or shortness of breath. Patient not taking: Reported on 01/07/2019 06/05/16   Campbell Riches, MD  diclofenac sodium (VOLTAREN) 1 % GEL Place onto the skin. 08/18/15   [provider]  ibuprofen (ADVIL,MOTRIN) 200 MG tablet Take 600 mg by mouth every 6 (six) hours as needed for fever or moderate pain. Reported on 06/05/2016    [provider]  nortriptyline (PAMELOR) 10 MG capsule Start nortriptyline 10mg  at bedtime for 2 week, then increase to 2 tablet at bedtime Patient not taking: Reported on 01/07/2019 07/12/18   Alda Berthold, DO  ODEFSEY 200-25-25 MG TABS tablet TAKE 1 TABLET BY MOUTH DAILY, WITH BREAKFAST 07/30/18   Campbell Riches, MD  omeprazole (PRILOSEC) 40 MG capsule  11/16/18   [provider]  oxyCODONE (ROXICODONE) 5 MG immediate release tablet Take 5 mg by mouth every 6 (six) hours as needed for severe pain.  02/23/16   [provider]  oxyCODONE-acetaminophen (PERCOCET/ROXICET) 5-325 MG tablet TK 1 T PO QAM PRN 07/15/17   [provider]  pregabalin (LYRICA) 200 MG capsule TK ONE C PO BID 11/07/17   [provider]  ranitidine (ZANTAC) 150 MG tablet Take by mouth. 05/10/18 05/10/19  [provider]  vitamin B-12 (  CYANOCOBALAMIN) 1000 MCG tablet Take by mouth.    [provider]    Family History Family History  Problem Relation Age of Onset  . Stomach cancer Sister   . Heart disease Paternal Grandfather   . Alzheimer's disease Paternal Grandmother   . Heart disease Maternal Grandmother   . Stroke Maternal Grandmother   . Other Mother        part of intestines taken out  . Dementia Mother   . Other Father        trauma    Social History Social History   Tobacco Use  . Smoking status: Current Some Day Smoker    Packs/day: 0.10    Years: 32.00    Pack years: 3.20    Types: Cigarettes, E-cigarettes     Start date: 01/13/1986  . Smokeless tobacco: Never Used  . Tobacco comment: occassional cigarette  Substance Use Topics  . Alcohol use: No    Alcohol/week: 0.0 standard drinks    Comment: Sober since 2012  . Drug use: No     Allergies   Ancef [cefazolin]; Ciprofloxacin; Penicillin g; and Wasp venom   Review of Systems Review of Systems  Respiratory: Positive for cough.   All other systems reviewed and are negative.    Physical Exam Updated Vital Signs BP 120/74   Pulse 72   Temp 97.8 F (36.6 C) (Oral)   Resp 18   Ht 5' 7.5" (1.715 m)   Wt 61.2 kg   SpO2 100%   BMI 20.83 kg/m   Physical Exam Vitals signs and nursing note reviewed.  Constitutional:      General: She is not in acute distress.    Appearance: She is well-developed. She is not diaphoretic.  HENT:     Head: Normocephalic and atraumatic.     Nose: No congestion.     Mouth/Throat:     Mouth: Mucous membranes are moist.     Pharynx: No oropharyngeal exudate or posterior oropharyngeal erythema.  Neck:     Musculoskeletal: Normal range of motion and neck supple.  Cardiovascular:     Rate and Rhythm: Normal rate and regular rhythm.     Heart sounds: No murmur. No friction rub. No gallop.   Pulmonary:     Effort: Pulmonary effort is normal. No respiratory distress.     Breath sounds: Normal breath sounds. No wheezing.  Abdominal:     General: Bowel sounds are normal. There is no distension.     Palpations: Abdomen is soft.     Tenderness: There is no abdominal tenderness.  Musculoskeletal: Normal range of motion.  Skin:    General: Skin is warm and dry.  Neurological:     Mental Status: She is alert and oriented to person, place, and time.      ED Treatments / Results  Labs (all labs ordered are listed, but only abnormal results are displayed) Labs Reviewed - No data to display  EKG None  Radiology No results found.  Procedures Procedures (including critical care time)  Medications  Ordered in ED Medications - No data to display   Initial Impression / Assessment and Plan / ED Course  I have reviewed the triage vital signs and the nursing notes.  Pertinent labs & imaging results that were available during my care of the patient were reviewed by me and considered in my medical decision making (see chart for details).  Patient presents with URI symptoms for the past 10 days.  She was  given Tamiflu, but this has not helped.  Patient symptoms are likely related to a bronchitis for which she will be given doxycycline.  Also considered is COVID, however she has no known ill contacts and has not traveled.  [name] was evaluated in Emergency Department on 04/03/2019 for the symptoms described in the history of present illness. She was evaluated in the context of the global COVID-19 pandemic, which necessitated consideration that the patient might be at risk for infection with the SARS-CoV-2 virus that causes COVID-19. Institutional protocols and algorithms that pertain to the evaluation of patients at risk for COVID-19 are in a state of rapid change based on information released by regulatory bodies including the CDC and federal and state organizations. These policies and algorithms were followed during the patient's care in the ED.  Final Clinical Impressions(s) / ED Diagnoses   Final diagnoses:  None    ED Discharge Orders    None       Veryl Speak, MD 04/03/19 [phone]

## 2019-04-03 NOTE — ED Triage Notes (Signed)
Cough, chills and bodyaches. She was treated with Tamaflu a week ago after having a tele-visit. She is no better.

## 2019-04-03 NOTE — Discharge Instructions (Addendum)
Doxycycline as prescribed.  Continue over-the-counter medications as needed for relief of symptoms.  Return to the emergency department if you develop worsening breathing, severe chest pain, or other new and concerning symptoms.

## 2019-07-12 ENCOUNTER — Other Ambulatory Visit: Payer: Self-pay | Admitting: Neurology

## 2019-09-06 ENCOUNTER — Other Ambulatory Visit: Payer: Self-pay | Admitting: Infectious Diseases

## 2019-09-06 DIAGNOSIS — B20 Human immunodeficiency virus [HIV] disease: Secondary | ICD-10-CM

## 2019-10-07 ENCOUNTER — Other Ambulatory Visit: Payer: Medicaid Other

## 2019-10-21 ENCOUNTER — Encounter: Payer: Medicaid Other | Admitting: Infectious Diseases

## 2020-12-15 ENCOUNTER — Other Ambulatory Visit: Payer: Self-pay | Admitting: Infectious Diseases

## 2020-12-15 DIAGNOSIS — B20 Human immunodeficiency virus [HIV] disease: Secondary | ICD-10-CM

## 2021-01-19 ENCOUNTER — Telehealth: Payer: Self-pay

## 2021-01-19 ENCOUNTER — Other Ambulatory Visit: Payer: Self-pay | Admitting: Infectious Diseases

## 2021-01-19 DIAGNOSIS — B20 Human immunodeficiency virus [HIV] disease: Secondary | ICD-10-CM

## 2021-01-19 NOTE — Telephone Encounter (Signed)
Received refill request for patient's Odefsey. Patient has not been seen by our office for 2 years. RN called patient, she states she has missed some doses of her Odefsey, but that she last took it this morning. She is in the process of transferring care to [facility]. She says a referral has already been sent, and that she will try calling again today to schedule an appointment with them. RN advised her that we will send in a 30 day supply of her Vernell Leep, but that she either needs to transfer care to [facility], or she can re-establish care with [place] for future refills. Patient verbalized understanding and has no further questions.   Beryle Flock, RN

## 2021-02-17 ENCOUNTER — Other Ambulatory Visit: Payer: Self-pay | Admitting: Infectious Diseases

## 2021-02-17 DIAGNOSIS — B20 Human immunodeficiency virus [HIV] disease: Secondary | ICD-10-CM

## 2023-01-18 DEATH — deceased

## 2023-02-16 DEATH — deceased
